# Patient Record
Sex: Female | Born: 1945 | Race: White | Hispanic: No | Marital: Married | Smoking: Former smoker
Health system: Southern US, Community
[De-identification: ages and names within clinical notes are randomized; demographics above are authoritative.]

## PROBLEM LIST (undated history)

## (undated) DIAGNOSIS — L920 Granuloma annulare: Secondary | ICD-10-CM

## (undated) DIAGNOSIS — G500 Trigeminal neuralgia: Secondary | ICD-10-CM

## (undated) DIAGNOSIS — R3915 Urgency of urination: Secondary | ICD-10-CM

## (undated) DIAGNOSIS — R519 Headache, unspecified: Secondary | ICD-10-CM

## (undated) DIAGNOSIS — Z8632 Personal history of gestational diabetes: Secondary | ICD-10-CM

## (undated) DIAGNOSIS — Q524 Other congenital malformations of vagina: Secondary | ICD-10-CM

## (undated) DIAGNOSIS — K219 Gastro-esophageal reflux disease without esophagitis: Secondary | ICD-10-CM

## (undated) DIAGNOSIS — I2510 Atherosclerotic heart disease of native coronary artery without angina pectoris: Secondary | ICD-10-CM

## (undated) DIAGNOSIS — O24419 Gestational diabetes mellitus in pregnancy, unspecified control: Secondary | ICD-10-CM

## (undated) DIAGNOSIS — Z8669 Personal history of other diseases of the nervous system and sense organs: Secondary | ICD-10-CM

## (undated) DIAGNOSIS — K068 Other specified disorders of gingiva and edentulous alveolar ridge: Secondary | ICD-10-CM

## (undated) DIAGNOSIS — E785 Hyperlipidemia, unspecified: Secondary | ICD-10-CM

## (undated) DIAGNOSIS — K6282 Dysplasia of anus: Secondary | ICD-10-CM

## (undated) DIAGNOSIS — J449 Chronic obstructive pulmonary disease, unspecified: Secondary | ICD-10-CM

## (undated) DIAGNOSIS — Z8489 Family history of other specified conditions: Secondary | ICD-10-CM

## (undated) DIAGNOSIS — R0600 Dyspnea, unspecified: Secondary | ICD-10-CM

## (undated) DIAGNOSIS — N809 Endometriosis, unspecified: Secondary | ICD-10-CM

## (undated) DIAGNOSIS — H469 Unspecified optic neuritis: Secondary | ICD-10-CM

## (undated) DIAGNOSIS — Z8719 Personal history of other diseases of the digestive system: Secondary | ICD-10-CM

## (undated) HISTORY — PX: MINOR HEMORRHOIDECTOMY: SHX6238

## (undated) HISTORY — DX: Granuloma annulare: L92.0

## (undated) HISTORY — DX: Atherosclerotic heart disease of native coronary artery without angina pectoris: I25.10

## (undated) HISTORY — PX: MANDIBLE SURGERY: SHX707

## (undated) HISTORY — DX: Gestational diabetes mellitus in pregnancy, unspecified control: O24.419

## (undated) HISTORY — PX: ABDOMINAL HYSTERECTOMY: SUR658

## (undated) HISTORY — DX: Other congenital malformations of vagina: Q52.4

## (undated) HISTORY — DX: Hyperlipidemia, unspecified: E78.5

## (undated) HISTORY — DX: Unspecified optic neuritis: H46.9

## (undated) HISTORY — DX: Endometriosis, unspecified: N80.9

## (undated) HISTORY — PX: TUBAL LIGATION: SHX77

---

## 2007-09-09 ENCOUNTER — Ambulatory Visit: Payer: Self-pay | Admitting: Obstetrics & Gynecology

## 2007-09-29 ENCOUNTER — Ambulatory Visit (HOSPITAL_COMMUNITY): Admission: RE | Admit: 2007-09-29 | Discharge: 2007-09-29 | Payer: Self-pay | Admitting: Obstetrics & Gynecology

## 2007-09-29 ENCOUNTER — Encounter: Admission: RE | Admit: 2007-09-29 | Discharge: 2007-09-29 | Payer: Self-pay | Admitting: Obstetrics & Gynecology

## 2007-10-04 ENCOUNTER — Ambulatory Visit: Payer: Self-pay | Admitting: Gynecology

## 2008-12-01 HISTORY — PX: CARDIAC CATHETERIZATION: SHX172

## 2009-08-01 DIAGNOSIS — I2510 Atherosclerotic heart disease of native coronary artery without angina pectoris: Secondary | ICD-10-CM

## 2009-08-01 HISTORY — DX: Atherosclerotic heart disease of native coronary artery without angina pectoris: I25.10

## 2009-08-24 ENCOUNTER — Ambulatory Visit: Payer: Medicare HMO | Admitting: Internal Medicine

## 2009-08-30 ENCOUNTER — Ambulatory Visit: Payer: Medicare HMO | Admitting: Cardiovascular Disease

## 2010-10-23 ENCOUNTER — Ambulatory Visit: Payer: Medicare HMO | Admitting: Internal Medicine

## 2010-12-01 DIAGNOSIS — Z87442 Personal history of urinary calculi: Secondary | ICD-10-CM

## 2010-12-01 HISTORY — DX: Personal history of urinary calculi: Z87.442

## 2011-04-15 NOTE — Assessment & Plan Note (Signed)
Wendy Patrick, YUILLE NO.:  [id number]   MEDICAL RECORD NO.:  [id number]          PATIENT TYPE:  POB   LOCATION:  [facility] at [facility]         FACILITY:  [facility]   PHYSICIAN:  Elsie Lincoln, MD      DATE OF BIRTH:  01-26-1946   DATE OF SERVICE:  09/09/2007                                  CLINIC NOTE   The patient is a 65 year old para 2 female menopausal for many years  since.  She had seen a gynecologist approximately 4 years ago. She  presents for annual exam and also for aid in menopausal symptoms. She is  placed on Estring several years ago which helped her vaginal dryness and  hot flashes as well as interestingly her severe diarrhea.  She stopped  it and all the symptoms had return including her diarrhea.  She would  like to go back on it and she understands the risk of breast cancer.  She said her mother did die of breast cancer.  However,  she is willing  to accept this risk of increased breast cancer.  She also understands  the risk of thromboembolism.  She has decreased libido, irritability,  lack of sleep, and severe hot flashes and night sweats.   PAST MEDICAL HISTORY:  She has lichen sclerosis, which she uses  betamethasone cream. She also has what looks like psoriasis on her neck.   PAST SURGICAL HISTORY:  1. Total abdominal hysterectomy.  2. Bilateral tubal ligation.  3. Her hysterectomy was done for she thinks endometriosis.  4. She also had a hemorrhoidectomy in the past.   MEDICATIONS:  Betamethasone cream 0.05% p.r.n. She is not using it  currently.   ALLERGIES:  None to latex or drugs.   FAMILY HISTORY:  Self had gestational diabetes.  Mother had breast  cancer and died at 60, 2 years after diagnosis and refusing  chemotherapy.   SOCIAL HISTORY:  She used to smoke, but quit several years ago. Does  drink caffeinated beverages and has an occasional alcoholic beverages.   SYSTEMIC REVIEW:  All 13 points positive for hot flashes, vaginal  itching, pain with intercourse.   PHYSICAL EXAM:  Pulse 64, blood pressure 139/80, weight 140.  GENERAL:  Well-nourished, well-developed no apparent distress.  HEENT:  Normocephalic, atraumatic.  NECK:  No masses.  There is lichen sclerosis on the left side.  LUNGS:  Clear to auscultation bilaterally.  HEART:  Regular rate and rhythm.  BREASTS:  No masses, no lymphadenopathy.  ABDOMEN:  Soft, nontender.  Well-healed vertical scar.  No organomegaly.  No hernia.  GENITALIA:  Tanner 5. __________ lichen sclerosis on the perineum.  Vagina is atrophic.  Bladder and urethra are well suspended.  Vaginal  vault is well suspended. There is pain on bimanual midline to slightly  left where there is a mass that is likely this is the ovary, but unable  to tell exactly. This ovary was much larger than what I was expecting on  postmenopausal woman, so I will get an ultrasound. Right adnexa is  nontender.  RECTOVAGINAL:  No masses, nontender.  EXTREMITIES:  Nontender.  No edema.   ASSESSMENT/PLAN:  65 year old  female  1. Mammogram.  2. No Pap smear needed as she has had a hysterectomy.  No history of      abnormal Pap smears.  3. Transvaginal ultrasound to evaluate pelvic mass.  4. Continue betamethasone cream p.r.n.  5. Estring for hormone replacement therapy.  6. Return to clinic when she returns from her vacation.  7. The patient needs a cholesterol and diabetes screening panel as      well as a TSH on her return.           ______________________________  Elsie Lincoln, MD     KL/MEDQ  D:  09/09/2007  T:  09/09/2007  Job:  [id number]

## 2011-04-15 NOTE — Assessment & Plan Note (Signed)
NAMECORRETTA, MUNCE NO.:  [id number]   MEDICAL RECORD NO.:  [id number]          PATIENT TYPE:  POB   LOCATION:  [facility] at [facility]         FACILITY:  [facility]   PHYSICIAN:  Elsie Lincoln, MD      DATE OF BIRTH:  November 14, 1946   DATE OF SERVICE:  10/04/2007                                  CLINIC NOTE   The patient is a 65 year old female who presents for results.  I had  felt some left fullness on her bimanual exam and she had some pain.  I  did a transvaginal ultrasound which revealed no visible ovaries.  It is  questionable whether maybe I was feeling stool in her colon or possibly  some scar tissue.  A CT would provide better determination of her  ovaries.  However, the patient does not want to pursue this.  She feels  there is nothing we could see on ultrasound that we could stop here.  I  agree with this.  She did have a mammogram and we reviewed that result,  and that was normal.  She is due for another mammogram in a year.  She  is not in need of any Pap smears.  She does need a cholesterol panel,  TSH, and glucose screen.  However, she will come back in when she is  fasting.  She understands the importance of this.  She also needs a  primary care physician and I recommend Dr. Milinda Antis or Dr. Ermalene Searing at  [facility].  The patient still needs to continue to have  calcium supplementation 1500 mg a day with vitamin D for bone protection  the patient is to come back in a year for a yearly exam but sooner for  her lab work.           ______________________________  Elsie Lincoln, MD     KL/MEDQ  D:  10/04/2007  T:  10/05/2007  Job:  [id number]

## 2012-06-17 ENCOUNTER — Ambulatory Visit: Payer: Self-pay | Admitting: Internal Medicine

## 2013-08-01 ENCOUNTER — Encounter (HOSPITAL_COMMUNITY): Payer: Self-pay | Admitting: Emergency Medicine

## 2013-08-01 ENCOUNTER — Emergency Department (INDEPENDENT_AMBULATORY_CARE_PROVIDER_SITE_OTHER)
Admission: EM | Admit: 2013-08-01 | Discharge: 2013-08-01 | Disposition: A | Discharge status: Home / Self Care | Payer: Medicare Other | Source: Home / Self Care | Attending: Emergency Medicine | Admitting: Emergency Medicine

## 2013-08-01 ENCOUNTER — Emergency Department (INDEPENDENT_AMBULATORY_CARE_PROVIDER_SITE_OTHER): Payer: Medicare Other

## 2013-08-01 DIAGNOSIS — J441 Chronic obstructive pulmonary disease with (acute) exacerbation: Secondary | ICD-10-CM

## 2013-08-01 HISTORY — DX: Chronic obstructive pulmonary disease, unspecified: J44.9

## 2013-08-01 MED ORDER — ALBUTEROL SULFATE HFA 108 (90 BASE) MCG/ACT IN AERS: 2.0000 | INHALATION_SPRAY | Freq: Four times a day (QID) | RESPIRATORY_TRACT | Status: DC

## 2013-08-01 MED ORDER — GUAIFENESIN-CODEINE 100-10 MG/5ML PO SYRP: 10.0000 mL | ORAL_SOLUTION | Freq: Four times a day (QID) | ORAL | Status: DC | PRN

## 2013-08-01 MED ORDER — TRAMADOL HCL 50 MG PO TABS: 100.0000 mg | ORAL_TABLET | Freq: Three times a day (TID) | ORAL | Status: DC | PRN

## 2013-08-01 MED ORDER — PREDNISONE 20 MG PO TABS: 20.0000 mg | ORAL_TABLET | Freq: Two times a day (BID) | ORAL | Status: DC

## 2013-08-01 MED ORDER — DOXYCYCLINE HYCLATE 100 MG PO TABS: 100.0000 mg | ORAL_TABLET | Freq: Two times a day (BID) | ORAL | Status: DC

## 2013-08-01 NOTE — ED Notes (Signed)
Waiting discharge papers 

## 2013-08-01 NOTE — ED Notes (Signed)
C/o cough x 10 days. Chest and rib pain from cough. Recent eye surgery states try to hold back cough. Not sleeping well.  Used robtusin every four hours with no relief. Hx of copd.

## 2013-08-01 NOTE — ED Provider Notes (Signed)
Chief Complaint:   Chief Complaint  Patient presents with  . Cough    x 10 days.     History of Present Illness:   Wendy Patrick is a 67 year old female who has had a ten-day history of a cough productive of small amounts of yellow sputum and some tightness in her chest. Her ribs have been aching on the left when she coughs. She's had slight rhinorrhea and sore throat. She had a chest x-ray and chest CT a month ago which showed COPD. She's not on any medication for this yet. She quit smoking 9 years ago. Prior to onset of her symptoms she had first a root canal, then surgery for detached retina. She denies fever, chills, headache, hemoptysis, or GI symptoms.  Review of Systems:  Other than noted above, the patient denies any of the following symptoms: Systemic:  No fevers, chills, sweats, weight loss or gain, fatigue, or tiredness. Eye:  No redness or discharge. ENT:  No ear pain, drainage, headache, nasal congestion, drainage, sinus pressure, difficulty swallowing, or sore throat. Neck:  No neck pain or swollen glands. Lungs:  No cough, sputum production, hemoptysis, wheezing, chest tightness, shortness of breath or chest pain. GI:  No abdominal pain, nausea, vomiting or diarrhea.  PMFSH:  Past medical history, family history, social history, meds, and allergies were reviewed. She takes Crestor and aspirin. She has a history of coronary artery disease and hypercholesterolemia.  Physical Exam:   Vital signs:  BP 166/89  Pulse 86  Temp(Src) 98.6 F (37 C) (Oral)  Resp 18  SpO2 98% General:  Alert and oriented.  In no distress.  Skin warm and dry. Eye:  No conjunctival injection or drainage. Lids were normal. ENT:  TMs and canals were normal, without erythema or inflammation.  Nasal mucosa was clear and uncongested, without drainage.  Mucous membranes were moist.  Pharynx was clear with no exudate or drainage.  There were no oral ulcerations or lesions. Neck:  Supple, no adenopathy,  tenderness or mass. Lungs:  No respiratory distress.  Lungs were clear to auscultation, without wheezes, rales or rhonchi.  Breath sounds were clear and equal bilaterally.  Heart:  Regular rhythm, without gallops, murmers or rubs. Skin:  Clear, warm, and dry, without rash or lesions.  Radiology:  Dg Chest 2 View  08/01/2013   *RADIOLOGY REPORT*  Clinical Data: Cough  CHEST - 2 VIEW  Comparison: None.  Findings: The heart and pulmonary vascularity are within normal limits.  The lungs are hyperinflated without evidence of focal infiltrate.  No acute bony abnormality is noted.  IMPRESSION: COPD without acute abnormality.   Original Report Authenticated By: Alcide Clever, M.D.   Assessment:  The encounter diagnosis was COPD with acute exacerbation.  No evidence of pneumonia.  Plan:   1.  The following meds were prescribed:   Discharge Medication List as of 08/01/2013  9:21 AM    START taking these medications   Details  albuterol (PROVENTIL HFA;VENTOLIN HFA) 108 (90 BASE) MCG/ACT inhaler Inhale 2 puffs into the lungs 4 (four) times daily., Starting 08/01/2013, Until Discontinued, Normal    doxycycline (VIBRA-TABS) 100 MG tablet Take 1 tablet (100 mg total) by mouth 2 (two) times daily., Starting 08/01/2013, Until Discontinued, Normal    guaiFENesin-codeine (GUIATUSS AC) 100-10 MG/5ML syrup Take 10 mLs by mouth 4 (four) times daily as needed for cough., Starting 08/01/2013, Until Discontinued, Print    predniSONE (DELTASONE) 20 MG tablet Take 1 tablet (20 mg total) by mouth  2 (two) times daily., Starting 08/01/2013, Until Discontinued, Normal    traMADol (ULTRAM) 50 MG tablet Take 2 tablets (100 mg total) by mouth every 8 (eight) hours as needed for pain., Starting 08/01/2013, Until Discontinued, Normal       2.  The patient was instructed in symptomatic care and handouts were given. 3.  The patient was told to return if becoming worse in any way, if no better in 3 or 4 days, and given some red flag  symptoms such as fever or any difficulty breathing that would indicate earlier return. 4.  Follow up here or with her primary care physician as needed.      Reuben Likes, MD 08/01/13 [phone]

## 2013-08-31 ENCOUNTER — Ambulatory Visit: Payer: Self-pay | Admitting: Internal Medicine

## 2013-09-07 ENCOUNTER — Ambulatory Visit: Payer: Self-pay | Admitting: Internal Medicine

## 2014-07-19 ENCOUNTER — Ambulatory Visit: Payer: Self-pay | Admitting: Obstetrics and Gynecology

## 2014-10-09 ENCOUNTER — Encounter (HOSPITAL_COMMUNITY): Payer: Self-pay | Admitting: Emergency Medicine

## 2014-10-09 ENCOUNTER — Inpatient Hospital Stay (HOSPITAL_COMMUNITY)
Admission: EM | Admit: 2014-10-09 | Discharge: 2014-10-13 | DRG: 123 | Disposition: A | Discharge status: Home / Self Care | Payer: Medicare HMO | Attending: Oncology | Admitting: Oncology

## 2014-10-09 DIAGNOSIS — H469 Unspecified optic neuritis: Secondary | ICD-10-CM | POA: Diagnosis present

## 2014-10-09 DIAGNOSIS — Z7982 Long term (current) use of aspirin: Secondary | ICD-10-CM

## 2014-10-09 DIAGNOSIS — R69 Illness, unspecified: Secondary | ICD-10-CM

## 2014-10-09 DIAGNOSIS — J449 Chronic obstructive pulmonary disease, unspecified: Secondary | ICD-10-CM | POA: Diagnosis present

## 2014-10-09 DIAGNOSIS — T380X5A Adverse effect of glucocorticoids and synthetic analogues, initial encounter: Secondary | ICD-10-CM | POA: Diagnosis present

## 2014-10-09 DIAGNOSIS — I2510 Atherosclerotic heart disease of native coronary artery without angina pectoris: Secondary | ICD-10-CM | POA: Diagnosis present

## 2014-10-09 DIAGNOSIS — R739 Hyperglycemia, unspecified: Secondary | ICD-10-CM | POA: Diagnosis present

## 2014-10-09 DIAGNOSIS — H5462 Unqualified visual loss, left eye, normal vision right eye: Secondary | ICD-10-CM

## 2014-10-09 DIAGNOSIS — Z79899 Other long term (current) drug therapy: Secondary | ICD-10-CM | POA: Diagnosis not present

## 2014-10-09 LAB — CBC
HCT: 45.1 % (ref 36.0–46.0)
HEMOGLOBIN: 14.7 g/dL (ref 12.0–15.0)
MCH: 27.7 pg (ref 26.0–34.0)
MCHC: 32.6 g/dL (ref 30.0–36.0)
MCV: 84.9 fL (ref 78.0–100.0)
PLATELETS: 320 10*3/uL (ref 150–400)
RBC: 5.31 MIL/uL — AB (ref 3.87–5.11)
RDW: 14.7 % (ref 11.5–15.5)
WBC: 5.8 10*3/uL (ref 4.0–10.5)

## 2014-10-09 LAB — SEDIMENTATION RATE: SED RATE: 2 mm/h (ref 0–22)

## 2014-10-09 LAB — BASIC METABOLIC PANEL
ANION GAP: 13 (ref 5–15)
BUN: 17 mg/dL (ref 6–23)
CALCIUM: 9.2 mg/dL (ref 8.4–10.5)
CO2: 26 meq/L (ref 19–32)
Chloride: 103 mEq/L (ref 96–112)
Creatinine, Ser: 0.86 mg/dL (ref 0.50–1.10)
GFR, EST AFRICAN AMERICAN: 78 mL/min — AB (ref >90)
GFR, EST NON AFRICAN AMERICAN: 67 mL/min — AB (ref >90)
GLUCOSE: 96 mg/dL (ref 70–99)
Potassium: 4.1 mEq/L (ref 3.7–5.3)
SODIUM: 142 meq/L (ref 137–147)

## 2014-10-09 MED ORDER — SODIUM CHLORIDE 0.9 % IV SOLN
250.0000 mg | Freq: Four times a day (QID) | INTRAVENOUS | Status: AC
  Administered 2014-10-10 – 2014-10-12 (×12): 250 mg via INTRAVENOUS
  Filled 2014-10-09 (×13): qty 2

## 2014-10-09 MED ORDER — METHYLPREDNISOLONE SODIUM SUCC 1000 MG IJ SOLR
250.0000 mg | Freq: Once | INTRAMUSCULAR | Status: AC
  Administered 2014-10-09: 250 mg via INTRAVENOUS
  Filled 2014-10-09 (×2): qty 2

## 2014-10-09 MED ORDER — PANTOPRAZOLE SODIUM 40 MG PO TBEC
40.0000 mg | DELAYED_RELEASE_TABLET | Freq: Every day | ORAL | Status: DC
  Administered 2014-10-09 – 2014-10-13 (×5): 40 mg via ORAL
  Filled 2014-10-09 (×5): qty 1

## 2014-10-09 MED ORDER — ACETAMINOPHEN 325 MG PO TABS: 650.0000 mg | ORAL_TABLET | Freq: Four times a day (QID) | ORAL | Status: DC | PRN

## 2014-10-09 MED ORDER — SODIUM CHLORIDE 0.9 % IV SOLN
INTRAVENOUS | Status: AC
  Administered 2014-10-09: 19:00:00 via INTRAVENOUS

## 2014-10-09 MED ORDER — HEPARIN SODIUM (PORCINE) 5000 UNIT/ML IJ SOLN
5000.0000 [IU] | Freq: Three times a day (TID) | INTRAMUSCULAR | Status: DC
  Administered 2014-10-09 – 2014-10-13 (×10): 5000 [IU] via SUBCUTANEOUS
  Filled 2014-10-09 (×10): qty 1

## 2014-10-09 MED ORDER — ACETAMINOPHEN 650 MG RE SUPP: 650.0000 mg | Freq: Four times a day (QID) | RECTAL | Status: DC | PRN

## 2014-10-09 NOTE — Progress Notes (Signed)
Patient just arrived to the floor, alert and oriented. Patient oriented to room and equipment. Initial assessment performed. NAD noted.

## 2014-10-09 NOTE — ED Notes (Signed)
Pt returned from CT °

## 2014-10-09 NOTE — ED Notes (Signed)
Admit Doctor completed with their assessment able to transport patient to floor.

## 2014-10-09 NOTE — Consult Note (Signed)
Reason for Consult:Optic neuritis Referring Physician: Beryle Beams  CC: Loss of vision  HPI: Wendy Patrick is an 68 y.o. female who reports that on Thursday night she was watching telelvison and had acute onset loss of vision from the left eye.  The patient reports that about 8 days prior she had some dental work. Afterward she developed a frontal headache that she felt was related to sinuses.  The headache then settled behind her left eye a few days prior to the loss of her vision.  She saw her ophthalmologist the next day and an MRI of the brain was ordered.  Over the weekend the patient reports that she felt off balance as well.  After her ophthalmologist could not get her an outpateint appointment the patient presented for evaluation in the ED.    Past Medical History  Diagnosis Date  . COPD (chronic obstructive pulmonary disease)     Past Surgical History  Procedure Laterality Date  . Eye surgery      Family history: Mother with breast cancer.  Died at 83.    Social History:  reports that she has never smoked. She does not have any smokeless tobacco history on file. She reports that she drinks alcohol. She reports that she does not use illicit drugs.  Allergies  Allergen Reactions  . Crestor [Rosuvastatin] Other (See Comments)    Leg cramps  . Latex Rash    Medications: I have reviewed the patient's current medications. Prior to Admission:  Current outpatient prescriptions: aspirin EC 81 MG tablet, Take 81 mg by mouth daily., Disp: , Rfl: ;  ibuprofen (ADVIL,MOTRIN) 200 MG tablet, Take 200 mg by mouth every 6 (six) hours as needed for moderate pain., Disp: , Rfl: ;  albuterol (PROVENTIL HFA;VENTOLIN HFA) 108 (90 BASE) MCG/ACT inhaler, Inhale 2 puffs into the lungs 4 (four) times daily., Disp: 1 Inhaler, Rfl: 0;  aspirin 81 MG tablet, Take 81 mg by mouth daily., Disp: , Rfl:  doxycycline (VIBRA-TABS) 100 MG tablet, Take 1 tablet (100 mg total) by mouth 2 (two) times daily., Disp: 20  tablet, Rfl: 0;  guaiFENesin-codeine (GUIATUSS AC) 100-10 MG/5ML syrup, Take 10 mLs by mouth 4 (four) times daily as needed for cough., Disp: 120 mL, Rfl: 0;  predniSONE (DELTASONE) 20 MG tablet, Take 1 tablet (20 mg total) by mouth 2 (two) times daily., Disp: 10 tablet, Rfl: 0 rosuvastatin (CRESTOR) 20 MG tablet, Take 20 mg by mouth daily., Disp: , Rfl: ;  traMADol (ULTRAM) 50 MG tablet, Take 2 tablets (100 mg total) by mouth every 8 (eight) hours as needed for pain., Disp: 30 tablet, Rfl: 0  ROS: History obtained from the patient  General ROS: negative for - chills, fatigue, fever, night sweats, weight gain or weight loss Psychological ROS: negative for - behavioral disorder, hallucinations, memory difficulties, mood swings or suicidal ideation Ophthalmic ROS: negative for - blurry vision, double vision, eye pain or loss of vision ENT ROS: as noted in HPI Allergy and Immunology ROS: negative for - hives or itchy/watery eyes Hematological and Lymphatic ROS: negative for - bleeding problems, bruising or swollen lymph nodes Endocrine ROS: negative for - galactorrhea, hair pattern changes, polydipsia/polyuria or temperature intolerance Respiratory ROS: negative for - cough, hemoptysis, shortness of breath or wheezing Cardiovascular ROS: negative for - chest pain, dyspnea on exertion, edema or irregular heartbeat Gastrointestinal ROS: negative for - abdominal pain, diarrhea, hematemesis, nausea/vomiting or stool incontinence Genito-Urinary ROS: negative for - dysuria, hematuria, incontinence or urinary frequency/urgency Musculoskeletal  ROS: negative for - joint swelling or muscular weakness Neurological ROS: as noted in HPI Dermatological ROS: negative for rash and skin lesion changes  Physical Examination: Blood pressure 153/68, pulse 67, temperature 97.5 F (36.4 C), temperature source Oral, resp. rate 16, SpO2 99 %.  Neurologic Examination Mental Status: Alert, oriented, thought content  appropriate.  Speech fluent without evidence of aphasia.  Able to follow 3 step commands without difficulty. Cranial Nerves: II: Discs flat bilaterally; Visual fields grossly normal in the right eye.  Vision minimal in the left eye.  Some color vision spared, pupils equal, round, reactive to light and accommodation III,IV, VI: ptosis not present, extra-ocular motions intact bilaterally V,VII: smile symmetric, facial light touch sensation normal bilaterally VIII: hearing normal bilaterally IX,X: gag reflex present XI: bilateral shoulder shrug XII: midline tongue extension Motor: Right : Upper extremity   5/5    Left:     Upper extremity   5/5  Lower extremity   5/5     Lower extremity   5/5 Tone and bulk:normal tone throughout; no atrophy noted Sensory: Pinprick and light touch intact throughout, bilaterally Deep Tendon Reflexes: 2+ and symmetric throughout Plantars: Right: mute   Left: mute Cerebellar: normal finger-to-nose and normal heel-to-shin test Gait: normal gait and station CV: pulses palpable throughout     Laboratory Studies:   Basic Metabolic Panel:  Recent Labs Lab 10/09/14 1717  NA 142  K 4.1  CL 103  CO2 26  GLUCOSE 96  BUN 17  CREATININE 0.86  CALCIUM 9.2    Liver Function Tests: No results for input(s): AST, ALT, ALKPHOS, BILITOT, PROT, ALBUMIN in the last 168 hours. No results for input(s): LIPASE, AMYLASE in the last 168 hours. No results for input(s): AMMONIA in the last 168 hours.  CBC:  Recent Labs Lab 10/09/14 1717  WBC 5.8  HGB 14.7  HCT 45.1  MCV 84.9  PLT 320    Cardiac Enzymes: No results for input(s): CKTOTAL, CKMB, CKMBINDEX, TROPONINI in the last 168 hours.  BNP: Invalid input(s): POCBNP  CBG: No results for input(s): GLUCAP in the last 168 hours.  Microbiology: No results found for this or any previous visit.  Coagulation Studies: No results for input(s): LABPROT, INR in the last 72 hours.  Urinalysis: No results  for input(s): COLORURINE, LABSPEC, PHURINE, GLUCOSEU, HGBUR, BILIRUBINUR, KETONESUR, PROTEINUR, UROBILINOGEN, NITRITE, LEUKOCYTESUR in the last 168 hours.  Invalid input(s): APPERANCEUR  Lipid Panel:  No results found for: CHOL, TRIG, HDL, CHOLHDL, VLDL, LDLCALC  HgbA1C: No results found for: HGBA1C  Urine Drug Screen:  No results found for: LABOPIA, COCAINSCRNUR, LABBENZ, AMPHETMU, THCU, LABBARB  Alcohol Level: No results for input(s): ETH in the last 168 hours.   Imaging: No results found.   Assessment/Plan: 68 year old female presenting with headache and loss of vision in the left eye.  The patient's vision loss has been present since 11/5.  There has been some mild improvement.  Remaining neurological examination is unremarkable.  MRI of the brain was reviewed and shows optic nerve hyperintensity.    Recommendations: 1.  Echocardiogram 2.  Carotid dopplers 3.  ESR, ANA, lyme titer, RPR 4.  Solumedrol 265m Q6 hours for 12 doses with GI prophylaxis 5.  Ultram prn for headache   LAlexis Goodell MD [facility] [phone]/08/2014, 7:48 PM

## 2014-10-09 NOTE — ED Notes (Signed)
Admit Doctor at bedside.  

## 2014-10-09 NOTE — ED Notes (Signed)
Pt may have [place] sandwich per doctor.

## 2014-10-09 NOTE — ED Notes (Signed)
Pt. unable to read anything on the visual acuity card with her left eye.

## 2014-10-09 NOTE — ED Notes (Signed)
Had MRI on left eye on friday because she  Lost vision in that eye on Thursday. Was called on Friday and told to  See neurologist today   For unspecified  Optic neuritis but she could not get in so she came to er

## 2014-10-09 NOTE — ED Provider Notes (Signed)
Medical screening examination/treatment/procedure(s) were conducted as a shared visit with non-physician practitioner(s) and myself.  I personally evaluated the patient during the encounter.   EKG Interpretation None      Patient seen by me. Patient evaluated by neurology. Patient had an outpatient MRI done. Patient's symptoms are consistent with optic neuritis including interpretation of the MRI. Patient with loss of vision in the left eye on Thursday. MRI was done on Friday. Patient's symptoms unchanged. Consulted neurology here and they will consult and they recommend admission.  Results for orders placed or performed during the hospital encounter of 10/09/14  CBC  Result Value Ref Range   WBC 5.8 4.0 - 10.5 K/uL   RBC 5.31 (H) 3.87 - 5.11 MIL/uL   Hemoglobin 14.7 12.0 - 15.0 g/dL   HCT 45.1 36.0 - 46.0 %   MCV 84.9 78.0 - 100.0 fL   MCH 27.7 26.0 - 34.0 pg   MCHC 32.6 30.0 - 36.0 g/dL   RDW 14.7 11.5 - 15.5 %   Platelets 320 150 - 400 K/uL  Basic metabolic panel  Result Value Ref Range   Sodium 142 137 - 147 mEq/L   Potassium 4.1 3.7 - 5.3 mEq/L   Chloride 103 96 - 112 mEq/L   CO2 26 19 - 32 mEq/L   Glucose, Bld 96 70 - 99 mg/dL   BUN 17 6 - 23 mg/dL   Creatinine, Ser 0.86 0.50 - 1.10 mg/dL   Calcium 9.2 8.4 - 10.5 mg/dL   GFR calc non Af Amer 67 (L) >90 mL/min   GFR calc Af Amer 78 (L) >90 mL/min   Anion gap 13 5 - 15     Fredia Sorrow, MD 10/09/14 1826

## 2014-10-09 NOTE — ED Provider Notes (Signed)
CSN: [id number]     Arrival date & time 10/09/14  1223 History   First MD Initiated Contact with Patient 10/09/14 1544     Chief Complaint  Patient presents with  . Eye Pain     (Consider location/radiation/quality/duration/timing/severity/associated sxs/prior Treatment) The history is provided by the patient and medical records. No language interpreter was used.     Wendy Patrick is a 68 y.o. female  with a hx of COPD presents to the Emergency Department complaining of loss of vision in the left eye on Thursday (4 days ago). She had an MRI of the eye on Friday.  Pt reports the MRI showed and was told to see the neurologist today, but was unable to be seen therefore she presented to the ED.   Associated symptoms include pain with extraocular movements and mild headache.  No aggravating or alleviating factors. Patient denies fever, chills, neck pain, neck stiffness, chest pain, shortness of breath, abdominal pain, nausea, vomiting, diarrhea, weakness, dizziness, syncope.    Past Medical History  Diagnosis Date  . COPD (chronic obstructive pulmonary disease)    Past Surgical History  Procedure Laterality Date  . Eye surgery     No family history on file. History  Substance Use Topics  . Smoking status: Never Smoker   . Smokeless tobacco: Not on file  . Alcohol Use: Yes   OB History    No data available     Review of Systems  Constitutional: Negative for fever, diaphoresis, appetite change, fatigue and unexpected weight change.  HENT: Negative for mouth sores.   Eyes: Positive for pain and visual disturbance.  Respiratory: Negative for cough, chest tightness, shortness of breath and wheezing.   Cardiovascular: Negative for chest pain.  Gastrointestinal: Negative for nausea, vomiting, abdominal pain, diarrhea and constipation.  Endocrine: Negative for polydipsia, polyphagia and polyuria.  Genitourinary: Negative for dysuria, urgency, frequency and hematuria.  Musculoskeletal:  Negative for back pain and neck stiffness.  Skin: Negative for rash.  Allergic/Immunologic: Negative for immunocompromised state.  Neurological: Positive for headaches. Negative for syncope and light-headedness.  Hematological: Does not bruise/bleed easily.  Psychiatric/Behavioral: Negative for sleep disturbance. The patient is not nervous/anxious.       Allergies  Crestor and Latex  Home Medications   Prior to Admission medications   Medication Sig Start Date End Date Taking? Authorizing Provider  aspirin EC 81 MG tablet Take 81 mg by mouth daily.   Yes Historical Provider, MD  ibuprofen (ADVIL,MOTRIN) 200 MG tablet Take 200 mg by mouth every 6 (six) hours as needed for moderate pain.   Yes Historical Provider, MD  albuterol (PROVENTIL HFA;VENTOLIN HFA) 108 (90 BASE) MCG/ACT inhaler Inhale 2 puffs into the lungs 4 (four) times daily. 08/01/13   Harden Mo, MD  aspirin 81 MG tablet Take 81 mg by mouth daily.    Historical Provider, MD  doxycycline (VIBRA-TABS) 100 MG tablet Take 1 tablet (100 mg total) by mouth 2 (two) times daily. 08/01/13   Harden Mo, MD  guaiFENesin-codeine [facility]) 100-10 MG/5ML syrup Take 10 mLs by mouth 4 (four) times daily as needed for cough. 08/01/13   Harden Mo, MD  predniSONE (DELTASONE) 20 MG tablet Take 1 tablet (20 mg total) by mouth 2 (two) times daily. 08/01/13   Harden Mo, MD  rosuvastatin (CRESTOR) 20 MG tablet Take 20 mg by mouth daily.    Historical Provider, MD  traMADol (ULTRAM) 50 MG tablet Take 2 tablets (100 mg  total) by mouth every 8 (eight) hours as needed for pain. 08/01/13   Harden Mo, MD   BP 153/68 mmHg  Pulse 67  Temp(Src) 97.5 F (36.4 C) (Oral)  Resp 16  SpO2 99% Physical Exam  Constitutional: She is oriented to person, place, and time. She appears well-developed and well-nourished. No distress.  HENT:  Head: Normocephalic and atraumatic.  Mouth/Throat: Oropharynx is clear and moist.  Eyes: Conjunctivae and  EOM are normal. Pupils are equal, round, and reactive to light. No scleral icterus.  No horizontal, vertical or rotational nystagmus  Neck: Normal range of motion. Neck supple.  Full active and passive ROM without pain No midline or paraspinal tenderness No nuchal rigidity or meningeal signs  Cardiovascular: Normal rate, regular rhythm, normal heart sounds and intact distal pulses.   No murmur heard. Pulmonary/Chest: Effort normal and breath sounds normal. No respiratory distress. She has no wheezes. She has no rales.  Abdominal: Soft. Bowel sounds are normal. There is no tenderness. There is no rebound and no guarding.  Musculoskeletal: Normal range of motion.  Lymphadenopathy:    She has no cervical adenopathy.  Neurological: She is alert and oriented to person, place, and time. She has normal reflexes. No cranial nerve deficit. She exhibits normal muscle tone. Coordination normal.  Mental Status:  Alert, oriented, thought content appropriate. Speech fluent without evidence of aphasia. Able to follow 2 step commands without difficulty.  Cranial Nerves:  II:  Peripheral visual fields grossly normal in the right eye; minimal-no vision in the left eye, pupils equal, round, reactive to light III,IV, VI: ptosis not present, extra-ocular motions intact bilaterally (with pain in the left) V,VII: smile symmetric, facial light touch sensation equal VIII: hearing grossly normal bilaterally  IX,X: gag reflex present  XI: bilateral shoulder shrug equal and strong XII: midline tongue extension  Motor:  5/5 in upper and lower extremities bilaterally including strong and equal grip strength and dorsiflexion/plantar flexion Sensory: Pinprick and light touch normal in all extremities.  Deep Tendon Reflexes: 2+ and symmetric  Cerebellar: normal finger-to-nose with bilateral upper extremities Gait: normal gait and balance CV: distal pulses palpable throughout   Skin: Skin is warm and dry. No rash  noted. She is not diaphoretic.  Psychiatric: She has a normal mood and affect. Her behavior is normal. Judgment and thought content normal.  Nursing note and vitals reviewed.   ED Course  Procedures (including critical care time) Labs Review Labs Reviewed  CBC  BASIC METABOLIC PANEL    Imaging Review No results found.   EKG Interpretation None      MDM   Final diagnoses:  Optic neuritis  Vision loss, left eye   Wendy Patrick presents with vision loss in the left eye for 5 days. Patient diagnosed with optic neuritis 3 days ago but was unable to see her neurologist therefore presented here to the emergency department. Patient denies history of CVA or focal neurologic deficits aside from the loss of vision.     5:05 PM Patient discussed with Dr. Katy Fitch who reports that MRI of the brain showed small white matter lesions that were questionably ischemic with a definite left optic neuritis.  Dr. Katy Fitch will fax the results of this MRI. Patient has a disc with images. Will consult neurology.  5:28 PM Pt discussed with Dr. Doy Mince of neurology who will consult.  Pt will receive methylprednisolone 250mg  IV and be admitted to medicine.    BP 153/68 mmHg  Pulse 67  Temp(Src) 97.5 F (36.4 C) (Oral)  Resp 16  SpO2 99%  Pt labs pending.  Pt discussed with Dr. Rogene Houston who will admit when labs result.    Jarrett Soho Koda Defrank, PA-C 10/09/14 [place], MD 10/09/14 1750

## 2014-10-09 NOTE — H&P (Signed)
Date: 10/10/2014               Patient Name:  Wendy Patrick MRN: [id number]  DOB: [birth year] Age / Sex: 68 y.o., female   PCP: Pcp Not In System         Medical Service: Internal Medicine Teaching Service         Attending Physician: Dr. Annia Belt, MD    First Contact: Dr. Randell Patient Pager: [phone]  Second Contact: Dr. Denton Brick Pager: [phone]       After Hours (After 5p/  First Contact Pager: [phone]  weekends / holidays): Second Contact Pager: [phone]   Chief Complaint: vision loss  History of Present Illness:   Patient is a 68 year old female with a history of COPD and coronary artery disease who presents with vision loss. Patient states that on October 29, she underwent a minor procedure at the dentist office, after which she noticed having increased frequency and severity of headaches. Patient states that she has been having headaches and congestion for several months now, and thinks that it may be sinus related.   About a week later, patient was watching Thursday night football when she found that she had sudden vision loss in her left eye. She states that the vision losses mostly in the center of her visual field, saying that she has somewhat preserved vision and color perception in her periphery. She states that she also has had associated photopsias. Patient also states that the vision loss was associated with throbbing temporal pain as well as intense left eye pain that was exacerbated by extraocular movements. The day after, patient was evaluated by her ophthalmologist (Dr. Katy Fitch) who suspected optic neuritis and sent her for an MRI that confirmed such findings.   Patient was to be evaluated by neurology, however, she was not able to get into clinic in a short time period, so Dr. Katy Fitch advised that she be evaluated and treated in the hospital. In the emergency department, patient received 250 mg Solu-Medrol and neurology was consulted.  Upon interview, patient reports  that her visual loss has been stable, although the pain in her eye and temporal area had completely resolved. Patient denied any jaw claudication or a significant history of morning stiffness or shoulder pain. Patient denies any personal or family history of multiple sclerosis. Patient only reports cataract surgery in her right eye and no eye surgeries in her left. Patient otherwise denies any fevers, chills, chest pain, dyspnea, abdominal pain, constipation, diarrhea, nausea, or vomiting.   Meds: Current Facility-Administered Medications  Medication Dose Route Frequency Provider Last Rate Last Dose  . 0.9 %  sodium chloride infusion   Intravenous STAT Fredia Sorrow, MD 10 mL/hr at 10/09/14 1912    . acetaminophen (TYLENOL) tablet 650 mg  650 mg Oral Q6H PRN Francesca Oman, DO       Or  . acetaminophen (TYLENOL) suppository 650 mg  650 mg Rectal Q6H PRN Francesca Oman, DO      . heparin injection 5,000 Units  5,000 Units Subcutaneous 3 times per day Francesca Oman, DO   5,000 Units at 10/09/14 2253  . methylPREDNISolone sodium succinate (SOLU-MEDROL) 250 mg in sodium chloride 0.9 % 50 mL IVPB  250 mg Intravenous Q6H Francesca Oman, DO   250 mg at 10/10/14 0010  . pantoprazole (PROTONIX) EC tablet 40 mg  40 mg Oral Daily Francesca Oman, DO   40 mg at 10/09/14 2253    Past  Medical History  Diagnosis Date  . COPD (chronic obstructive pulmonary disease)    Past Surgical History  Procedure Laterality Date  . Eye surgery      Allergies: Allergies as of 10/09/2014 - Review Complete 10/09/2014  Allergen Reaction Noted  . Crestor [rosuvastatin] Other (See Comments) 10/09/2014  . Latex Rash 10/09/2014   No family history on file. History   Social History  . Marital Status: Married    Spouse Name: N/A    Number of Children: N/A  . Years of Education: N/A   Occupational History  . Not on file.   Social History Main Topics  . Smoking status: Never Smoker   . Smokeless tobacco: Not on file   . Alcohol Use: Yes  . Drug Use: No  . Sexual Activity: Not Currently   Other Topics Concern  . Not on file   Social History Narrative    Review of Systems: All pertinent ROS has stated in HPI.   Physical Exam: Blood pressure 153/63, pulse 62, temperature 97.8 F (36.6 C), temperature source Oral, resp. rate 18, height 5' 4" (1.626 m), weight 124 lb 1.6 oz (56.291 kg), SpO2 98 %. General: resting in bed, in no acute distress HEENT: right pupil round and reactive to light, left pupil sluggish, EOMI, no scleral icterus Cardiac: RRR, no rubs, murmurs or gallops Pulm: clear to auscultation bilaterally, moving normal volumes of air Abd: soft, nontender, nondistended, BS present Ext: warm and well perfused, no pedal edema Neuro: alert and oriented X3  - CNII:     - OD: visual acuity grossly intact, peripheral vision intact, no abnormalities noted on funduscopic exam    - OS: worse than 20/200, peripheral vision intact on the left, central visual field deficit  afferent pupillary defect, no abnormalities noted on funduscopic exam, no evidence of papillitis  - cranial nerves III through XII intact  - motor: Strength 5+ throughout all 4 extremities  - Sensation: Intact to light touch throughout all 4 extremities  - Reflexes: 2+ deep tendon reflexes throughout all 4 extremities  - cerebellar: Finger to nose intact  Lab results: Basic Metabolic Panel:  Recent Labs  10/09/14 1717  NA 142  K 4.1  CL 103  CO2 26  GLUCOSE 96  BUN 17  CREATININE 0.86  CALCIUM 9.2   Liver Function Tests: No results for input(s): AST, ALT, ALKPHOS, BILITOT, PROT, ALBUMIN in the last 72 hours. No results for input(s): LIPASE, AMYLASE in the last 72 hours. No results for input(s): AMMONIA in the last 72 hours. CBC:  Recent Labs  10/09/14 1717  WBC 5.8  HGB 14.7  HCT 45.1  MCV 84.9  PLT 320   Cardiac Enzymes: No results for input(s): CKTOTAL, CKMB, CKMBINDEX, TROPONINI in the last 72  hours. BNP: No results for input(s): PROBNP in the last 72 hours. D-Dimer: No results for input(s): DDIMER in the last 72 hours. CBG: No results for input(s): GLUCAP in the last 72 hours. Hemoglobin A1C: No results for input(s): HGBA1C in the last 72 hours. Fasting Lipid Panel: No results for input(s): CHOL, HDL, LDLCALC, TRIG, CHOLHDL, LDLDIRECT in the last 72 hours. Thyroid Function Tests: No results for input(s): TSH, T4TOTAL, FREET4, T3FREE, THYROIDAB in the last 72 hours. Anemia Panel: No results for input(s): VITAMINB12, FOLATE, FERRITIN, TIBC, IRON, RETICCTPCT in the last 72 hours. Coagulation: No results for input(s): LABPROT, INR in the last 72 hours. Urine Drug Screen: Drugs of Abuse  No results found for: LABOPIA, COCAINSCRNUR,  LABBENZ, AMPHETMU, THCU, LABBARB  Alcohol Level: No results for input(s): ETH in the last 72 hours. Urinalysis: No results for input(s): COLORURINE, LABSPEC, PHURINE, GLUCOSEU, HGBUR, BILIRUBINUR, KETONESUR, PROTEINUR, UROBILINOGEN, NITRITE, LEUKOCYTESUR in the last 72 hours.  Invalid input(s): APPERANCEUR  Imaging results:   MR brain and MR orbits with and without contrast 10/06/2014     Findings: Basal cisterns, ventricles and cortical sulci are appropriate for the patient's age. There is no shift of midline structures. Axial FLAIR imaging demonstrates patchy and focal increased signal within the cerebral white matter tracts bilaterally, somewhat more pronounced on the left than the right consistent with senescent microvascular ischemic change. There is no intracranial hemorrhage or mass. No extra-axial collections are seen. Diffusion weighted imaging is negative for restricted diffusion and acute infarction.  There is definite optic nerve asymmetry with moderate swelling of the left optic nerve that measures approximately 6 mm in transverse diameter compared with approximately 4 mm in transverse diameter on the right at the same level.  Following contrast administration, the left optic nerve demonstrates fairly marked diffuse enhancement. Refer to series 20 postcontrast T1 coronal images 6 through 10.  Impression: Relatively mild cerebral white matter senescent microvascular ischemic change. No acute intracranial process evident. Findings of left optic neuritis as described above.  Assessment & Plan by Problem: Principal Problem:   Optic neuritis Active Problems:   COPD (chronic obstructive pulmonary disease)  Patient is a 68 year old female with a history of COPD and coronary artery disease who presents with vision loss most likely secondary to optic neuritis.  Optic neuritis: Patient with sudden loss of vision in the left eye since Thursday. Patient with an outside MRI that showing optic nerve hyperintensity consistent with optic neuritis, and neurology was consulted and recommended admission. Patient denies a history of multiple sclerosis. Features not typical of optic neuritis include age of presentation (usually 20-50 years) and sudden onset (optic neuritis usually manifests over hours to days). However, the central scotoma is typical, and MRI findings are strongly suggestive.  - Continue Solu-Medrol 250 mg every 6 hours for 12 doses   - Spoke with Dr. Katy Fitch over the phone who says that patient can follow-up as an outpatient in 2 weeks.  - Follow-up with neurology as an outpatient as 30% of individuals with developmental multiple sclerosis of 5 years.  - Appreciate neurology recommendations: Echocardiogram, carotid Dopplers, ESR, ANA, Lyme titer, RPR, tramadol when necessary for headache.  COPD: clear to auscultation bilaterally on exam. No acute issues.   - Continue home albuterol  Coronary artery disease: Patient reports some kind of procedure that showed 40% occlusion. Patient denies any history of stenting or cardiac surgery. Patient reports having myopathies in response to Crestor. She has not been taking this  [email or web address]  - Continue home aspirin 81 mg daily  - Hold home Crestor 20 mg daily  Diet: heart healthy diet Prophylaxis: heparin subcutaneous Code: full code  Dispo: Disposition is deferred at this time, awaiting improvement of current medical problems. Anticipated discharge in approximately 3 day(s).   The patient does not have a current PCP (Pcp Not In System) and does need an [facility] follow-up appointment after discharge.  The patient does not have transportation limitations that hinder transportation to clinic appointments.  Signed: Luan Moore, M.D., Ph.D. Internal Medicine Teaching Service, PGY-1 10/10/2014, 12:13 AM

## 2014-10-10 DIAGNOSIS — H539 Unspecified visual disturbance: Secondary | ICD-10-CM

## 2014-10-10 DIAGNOSIS — I2510 Atherosclerotic heart disease of native coronary artery without angina pectoris: Secondary | ICD-10-CM

## 2014-10-10 DIAGNOSIS — H5462 Unqualified visual loss, left eye, normal vision right eye: Secondary | ICD-10-CM

## 2014-10-10 DIAGNOSIS — H469 Unspecified optic neuritis: Secondary | ICD-10-CM

## 2014-10-10 DIAGNOSIS — H547 Unspecified visual loss: Secondary | ICD-10-CM

## 2014-10-10 DIAGNOSIS — J449 Chronic obstructive pulmonary disease, unspecified: Secondary | ICD-10-CM

## 2014-10-10 LAB — COMPREHENSIVE METABOLIC PANEL
ALT: 14 U/L (ref 0–35)
AST: 19 U/L (ref 0–37)
Albumin: 3.7 g/dL (ref 3.5–5.2)
Alkaline Phosphatase: 74 U/L (ref 39–117)
Anion gap: 15 (ref 5–15)
BUN: 17 mg/dL (ref 6–23)
CO2: 24 mEq/L (ref 19–32)
CREATININE: 0.74 mg/dL (ref 0.50–1.10)
Calcium: 9.3 mg/dL (ref 8.4–10.5)
Chloride: 103 mEq/L (ref 96–112)
GFR, EST NON AFRICAN AMERICAN: 84 mL/min — AB (ref >90)
GLUCOSE: 189 mg/dL — AB (ref 70–99)
Potassium: 4.2 mEq/L (ref 3.7–5.3)
Sodium: 142 mEq/L (ref 137–147)
Total Bilirubin: 0.3 mg/dL (ref 0.3–1.2)
Total Protein: 7 g/dL (ref 6.0–8.3)

## 2014-10-10 LAB — CBC
HCT: 49 % — ABNORMAL HIGH (ref 36.0–46.0)
Hemoglobin: 16.1 g/dL — ABNORMAL HIGH (ref 12.0–15.0)
MCH: 28.3 pg (ref 26.0–34.0)
MCHC: 32.9 g/dL (ref 30.0–36.0)
MCV: 86.3 fL (ref 78.0–100.0)
PLATELETS: 346 10*3/uL (ref 150–400)
RBC: 5.68 MIL/uL — AB (ref 3.87–5.11)
RDW: 14.4 % (ref 11.5–15.5)
WBC: 8.4 10*3/uL (ref 4.0–10.5)

## 2014-10-10 LAB — ANA: Anti Nuclear Antibody(ANA): NEGATIVE

## 2014-10-10 LAB — HIV ANTIBODY (ROUTINE TESTING W REFLEX): HIV: NONREACTIVE

## 2014-10-10 LAB — B. BURGDORFI ANTIBODIES: B burgdorferi Ab IgG+IgM: 0.22 {ISR}

## 2014-10-10 LAB — GLUCOSE, CAPILLARY
Glucose-Capillary: 169 mg/dL — ABNORMAL HIGH (ref 70–99)
Glucose-Capillary: 176 mg/dL — ABNORMAL HIGH (ref 70–99)

## 2014-10-10 LAB — RPR

## 2014-10-10 MED ORDER — ASPIRIN 81 MG PO CHEW
81.0000 mg | CHEWABLE_TABLET | Freq: Every day | ORAL | Status: DC
  Administered 2014-10-10 – 2014-10-13 (×4): 81 mg via ORAL
  Filled 2014-10-10 (×8): qty 1

## 2014-10-10 NOTE — Progress Notes (Signed)
OT Cancellation Note  Patient Details Name: Wendy Patrick MRN: [id number] DOB: 1946/06/22   Cancelled Treatment:    Reason Eval/Treat Not Completed: Patient at procedure or test/ unavailable (off unit currently)  Peri Maris  Pager: [phone]  10/10/2014, 9:00 AM

## 2014-10-10 NOTE — Progress Notes (Signed)
Subjective:  Wendy Patrick reports that her vision is minimally improving, she can make out the outlines of objects whereas previously she could not see anything from the left eye. She occasionally has a twinge of discomfort in the left eye with ocular movements however states that the previous pain is largely gone. She denies any other neurological symptoms.  Objective: Vital signs in last 24 hours: Filed Vitals:   10/09/14 2138 10/10/14 0157 10/10/14 0614 10/10/14 1009  BP: 153/63 132/76 133/76 130/65  Pulse: 62 73 71 80  Temp: 97.8 F (36.6 C) 97.7 F (36.5 C) 97.6 F (36.4 C) 97.7 F (36.5 C)  TempSrc: Oral Oral Oral Oral  Resp: 18 18 16 18   Height: 5\' 4"  (1.626 m)     Weight: 56.291 kg (124 lb 1.6 oz)     SpO2: 98% 96% 99% 97%   Weight change:   Intake/Output Summary (Last 24 hours) at 10/10/14 1020 Last data filed at 10/10/14 0900  Gross per 24 hour  Intake    120 ml  Output      0 ml  Net    120 ml   General appearance: alert, cooperative, appears stated age and no distress Head: Normocephalic, without obvious abnormality, atraumatic Eyes: conjunctivae/corneas clear. Pupils equal and reactive to light, improved. Some discomfort with EOMs Throat: lips, mucosa, and tongue normal; teeth and gums normal Back: symmetric, no curvature. ROM normal. No CVA tenderness. Lungs: clear to auscultation bilaterally Heart: regular rate and rhythm, S1, S2 normal, no murmur, click, rub or gallop Abdomen: soft, non-tender; bowel sounds normal; no masses,  no organomegaly Extremities: extremities normal, atraumatic, no cyanosis or edema Neurologic: Mental status: Alert, oriented, thought content appropriate CNII: Visual acuity and visual fields normal in R eye. L eye acuity is still diminished, although she can make out the outline of objects and shapes, some peripheral field deficit but is improving Motor: grossly normal Reflexes: 2+ and symmetric Coordination: normal Lab  Results: Results for orders placed or performed during the hospital encounter of 10/09/14 (from the past 24 hour(s))  CBC     Status: Abnormal   Collection Time: 10/09/14  5:17 PM  Result Value Ref Range   WBC 5.8 4.0 - 10.5 K/uL   RBC 5.31 (H) 3.87 - 5.11 MIL/uL   Hemoglobin 14.7 12.0 - 15.0 g/dL   HCT 45.1 36.0 - 46.0 %   MCV 84.9 78.0 - 100.0 fL   MCH 27.7 26.0 - 34.0 pg   MCHC 32.6 30.0 - 36.0 g/dL   RDW 14.7 11.5 - 15.5 %   Platelets 320 150 - 400 K/uL  Basic metabolic panel     Status: Abnormal   Collection Time: 10/09/14  5:17 PM  Result Value Ref Range   Sodium 142 137 - 147 mEq/L   Potassium 4.1 3.7 - 5.3 mEq/L   Chloride 103 96 - 112 mEq/L   CO2 26 19 - 32 mEq/L   Glucose, Bld 96 70 - 99 mg/dL   BUN 17 6 - 23 mg/dL   Creatinine, Ser 0.86 0.50 - 1.10 mg/dL   Calcium 9.2 8.4 - 10.5 mg/dL   GFR calc non Af Amer 67 (L) >90 mL/min   GFR calc Af Amer 78 (L) >90 mL/min   Anion gap 13 5 - 15  Sedimentation rate     Status: None   Collection Time: 10/09/14  7:48 PM  Result Value Ref Range   Sed Rate 2 0 - 22 mm/hr  ANA  Status: None   Collection Time: 10/09/14  7:48 PM  Result Value Ref Range   ANA Ser Ql NEGATIVE NEGATIVE  B. burgdorfi antibodies     Status: None   Collection Time: 10/09/14  7:48 PM  Result Value Ref Range   B burgdorferi Ab IgG+IgM 0.22 ISR  RPR     Status: None   Collection Time: 10/09/14  7:48 PM  Result Value Ref Range   RPR NON REAC NON REAC  HIV antibody     Status: None   Collection Time: 10/09/14  7:48 PM  Result Value Ref Range   HIV 1&2 Ab, 4th Generation NONREACTIVE NONREACTIVE  Comprehensive metabolic panel     Status: Abnormal   Collection Time: 10/10/14  8:34 AM  Result Value Ref Range   Sodium 142 137 - 147 mEq/L   Potassium 4.2 3.7 - 5.3 mEq/L   Chloride 103 96 - 112 mEq/L   CO2 24 19 - 32 mEq/L   Glucose, Bld 189 (H) 70 - 99 mg/dL   BUN 17 6 - 23 mg/dL   Creatinine, Ser 0.74 0.50 - 1.10 mg/dL   Calcium 9.3 8.4 - 10.5  mg/dL   Total Protein 7.0 6.0 - 8.3 g/dL   Albumin 3.7 3.5 - 5.2 g/dL   AST 19 0 - 37 U/L   ALT 14 0 - 35 U/L   Alkaline Phosphatase 74 39 - 117 U/L   Total Bilirubin 0.3 0.3 - 1.2 mg/dL   GFR calc non Af Amer 84 (L) >90 mL/min   GFR calc Af Amer >90 >90 mL/min   Anion gap 15 5 - 15  CBC     Status: Abnormal   Collection Time: 10/10/14  8:34 AM  Result Value Ref Range   WBC 8.4 4.0 - 10.5 K/uL   RBC 5.68 (H) 3.87 - 5.11 MIL/uL   Hemoglobin 16.1 (H) 12.0 - 15.0 g/dL   HCT 49.0 (H) 36.0 - 46.0 %   MCV 86.3 78.0 - 100.0 fL   MCH 28.3 26.0 - 34.0 pg   MCHC 32.9 30.0 - 36.0 g/dL   RDW 14.4 11.5 - 15.5 %   Platelets 346 150 - 400 K/uL   Micro Results: No results found for this or any previous visit (from the past 240 hour(s)). Studies/Results: No results found. Medications: I have reviewed the patient's current medications. Scheduled Meds: . aspirin  81 mg Oral Daily  . heparin  5,000 Units Subcutaneous 3 times per day  . methylPREDNISolone (SOLU-MEDROL) injection  250 mg Intravenous Q6H  . pantoprazole  40 mg Oral Daily   Continuous Infusions:  PRN Meds:.acetaminophen **OR** acetaminophen Assessment/Plan: Principal Problem:   Optic neuritis Active Problems:   COPD (chronic obstructive pulmonary disease)  Optic neuritis: Patient with sudden loss of vision in the left eye since Thursday. Patient with an outside MRI that showing optic nerve hyperintensity consistent with optic neuritis. Patient denies a history of multiple sclerosis. Echo revealed no evidence of cardiac source of thrombus, dopplers showed 1-39% carotid stenosis bilaterally. She was ANA negative, RPR non-reactive, and HIV non-reactive, lyme negative. - Continue Solu-Medrol 250 mg every 6 hours for 12 doses   - Transition to oral prednisone 60 mg afterwards  - May need to consider antibiotic ppx given high steroid load - Spoke with Dr. Katy Fitch over the phone who says that patient can follow-up as an outpatient in  2 weeks. - Follow-up with neurology as an outpatient as 30% of individuals with developmental  multiple sclerosis of 5 years.  COPD: clear to auscultation bilaterally on exam. No acute issues.  - Continue home albuterol  Coronary artery disease: Patient reports some kind of procedure that showed 40% occlusion. Patient denies any history of stenting or cardiac surgery. Patient reports having myopathies in response to Crestor. She has not been taking this lately. - Continue home aspirin 81 mg daily - Hold home Crestor 20 mg daily  Diet: heart healthy diet Prophylaxis: heparin subcutaneous Code: full code  Dispo: Disposition is deferred at this time, awaiting improvement of current medical problems. Anticipated discharge in approximately 3 day(s).   The patient does not have a current PCP (Pcp Not In System) and does need an [facility] hospital follow-up appointment after discharge.  The patient does not have transportation limitations that hinder transportation to clinic appointments.  This is a Careers information officer Note.  The care of the patient was discussed with Dr. Denton Brick and the assessment and plan formulated with their assistance.  Please see their attached note for official documentation of the daily encounter.   LOS: 1 day   Abran Duke, Med Student 10/10/2014, 10:20 AM

## 2014-10-10 NOTE — Evaluation (Signed)
Occupational Therapy Evaluation Patient Details Name: Wendy Patrick MRN: [id number] DOB: 1946/06/14 Today's Date: 10/10/2014    History of Present Illness Patient is a 68 y/o female with history of COPD and CAD who presents with vision loss. Per MD note, pt was watching Thursday night football when she found that she had sudden vision loss in her left eye. She states that the vision losses mostly in the center of her visual field, saying that she has somewhat preserved vision and color perception in her periphery. She states that she also has had associated photopsias. Patient also states that the vision loss was associated with throbbing temporal pain as well as intense left eye pain that was exacerbated by extraocular movements. The day after, patient was evaluated by her ophthalmologist (Dr. Katy Fitch) who suspected optic neuritis and sent her for an MRI that confirmed such findings.    Clinical Impression   PT admitted with new onset of L eye visual changes with CVA workup underway. Dr Katy Fitch dx optic neuritis. Pt currently with functional limitiations due to the deficits listed below (see OT problem list). Pt independent with adls. Pt will benefit from skilled OT to increase their independence and safety with adls and balance to allow discharge outpatient for vision and balance. Ot to follow acutely for vision compensatory strategies with adls.     Follow Up Recommendations  Outpatient OT (vision and balance)    Equipment Recommendations  None recommended by OT    Recommendations for Other Services       Precautions / Restrictions Precautions Precautions: Fall Precaution Comments: visual deficits left eye Restrictions Weight Bearing Restrictions: No      Mobility Bed Mobility Overal bed mobility: Modified Independent             General bed mobility comments: Received sitting in chair upon PT arrival.   Transfers Overall transfer level: Needs assistance Equipment used:  None Transfers: Sit to/from Stand Sit to Stand: Supervision         General transfer comment: first time out of bed and x1 lob    Balance Overall balance assessment: Needs assistance   Sitting balance-Leahy Scale: Normal     Standing balance support: No upper extremity supported;During functional activity Standing balance-Leahy Scale: Good               High level balance activites: Direction changes;Sudden stops;Head turns;Turns High Level Balance Comments: Tolerated higher level balance activities as mentioned above without overt LOB.            ADL Overall ADL's : Needs assistance/impaired Eating/Feeding: Modified independent;Sitting   Grooming: Wash/dry Tree surgeon: Supervision/safety;Ambulation           Functional mobility during ADLs: Min guard General ADL Comments: Pt demonstrates vision deficits listed below. pt demonstrates x1 LOB during OT evaluation. pt first time up this admission. pt with LOB turning head.      Vision Eye Alignment: Within Functional Limits   Ocular Range of Motion: Within Functional Limits Tracking/Visual Pursuits: Requires cues, head turns, or add eye shifts to track   Convergence: Impaired (comment)     Additional Comments: Pt demonstrates L eye impairments . Pt describes a central key hole shape of vision in L eye. Pt reports all other areas (superior inferior and peripheral vision abscent. Pt only in this small key hole central vision seeing object. Pt tracking object  in all quadrants and sustaining vision to object but states "abscence" of vision   Perception     Praxis      Pertinent Vitals/Pain Pain Assessment: No/denies pain     Hand Dominance Right   Extremity/Trunk Assessment Upper Extremity Assessment Upper Extremity Assessment: Overall WFL for tasks assessed   Lower Extremity Assessment Lower Extremity Assessment: Defer to PT evaluation    Cervical / Trunk Assessment Cervical / Trunk Assessment: Normal   Communication Communication Communication: No difficulties   Cognition Arousal/Alertness: Awake/alert Behavior During Therapy: WFL for tasks assessed/performed Overall Cognitive Status: Within Functional Limits for tasks assessed                     General Comments       Exercises       Shoulder Instructions      Home Living Family/patient expects to be discharged to:: Private residence Living Arrangements: Spouse/significant other Available Help at Discharge: Family;Available 24 hours/day Type of Home: Apartment Home Access: Elevator     Home Layout: One level         Bathroom Toilet: Standard     Home Equipment: None          Prior Functioning/Environment Level of Independence: Independent        Comments: Pt very active PTA. Walks on treadmill daily and does line dancing.    OT Diagnosis: Generalized weakness;Disturbance of vision   OT Problem List: Impaired vision/perception;Impaired balance (sitting and/or standing)   OT Treatment/Interventions: Self-care/ADL training;Visual/perceptual remediation/compensation;Patient/family education;Balance training    OT Goals(Current goals can be found in the care plan section) Acute Rehab OT Goals Patient Stated Goal: to return home OT Goal Formulation: With patient/family Time For Goal Achievement: 10/24/14 Potential to Achieve Goals: Good  OT Frequency: Min 2X/week   Barriers to D/C:            Co-evaluation              End of Session Equipment Utilized During Treatment: Gait belt Nurse Communication: Mobility status;Precautions  Activity Tolerance: Patient tolerated treatment well Patient left: in chair;with call bell/phone within reach;with family/visitor present   Time: 1156-1207 OT Time Calculation (min): 11 min Charges:  OT General Charges $OT Visit: 1 Procedure OT Evaluation $Initial OT Evaluation Tier I:  1 Procedure OT Treatments $Self Care/Home Management : 8-22 mins G-Codes:    Peri Maris 11-08-2014, 2:28 PM  Pager: [phone]

## 2014-10-10 NOTE — Progress Notes (Signed)
  Echocardiogram 2D Echocardiogram has been performed.  Axel Frisk 10/10/2014, 9:23 AM

## 2014-10-10 NOTE — Evaluation (Addendum)
Physical Therapy Evaluation Patient Details Name: Wendy Patrick MRN: [id number] DOB: October 31, 1946 Today's Date: 10/10/2014   History of Present Illness  Patient is a 68 y/o female with history of COPD and CAD who presents with vision loss. Per MD note, pt was watching Thursday night football when she found that she had sudden vision loss in her left eye. She states that the vision losses mostly in the center of her visual field, saying that she has somewhat preserved vision and color perception in her periphery. She states that she also has had associated photopsias. Patient also states that the vision loss was associated with throbbing temporal pain as well as intense left eye pain that was exacerbated by extraocular movements. The day after, patient was evaluated by her ophthalmologist (Dr. Katy Fitch) who suspected optic neuritis and sent her for an MRI that confirmed such findings.     Clinical Impression  Patient presents with visual loss in left eye - improved from yesterday. Pt functioning close to baseline and tolerated ambulating community distances while performing higher level balance challenges without difficulty or LOB. Pt able to compensate well using right eye. Pt does not require skilled therapy services as pt is functioning close to baseline. Pt has necessary supervision at home. Encourage ambulation daily in hall with husband or Therapist, sports. Education to be vigilant of surroundings especially with current visual loss to maintain safety. Discharge from therapy.    Follow Up Recommendations No PT follow up    Equipment Recommendations  None recommended by PT    Recommendations for Other Services       Precautions / Restrictions Precautions Precautions: None Precaution Comments: visual deficits left eye Restrictions Weight Bearing Restrictions: No      Mobility  Bed Mobility               General bed mobility comments: Received sitting in chair upon PT arrival.    Transfers Overall transfer level: Independent Equipment used: None                Ambulation/Gait Ambulation/Gait assistance: Supervision   Assistive device: None Gait Pattern/deviations: Step-through pattern;Drifts right/left     General Gait Details: Pt with steady gait. Tolerated higher level balance challenges with only minor drifting but no LOB (reports as baseline). See balance section for details.  Stairs            Wheelchair Mobility    Modified Rankin (Stroke Patients Only)       Balance Overall balance assessment: Needs assistance   Sitting balance-Leahy Scale: Normal       Standing balance-Leahy Scale: Good               High level balance activites: Direction changes;Sudden stops;Head turns;Turns High Level Balance Comments: Tolerated higher level balance activities as mentioned above without overt LOB.             Pertinent Vitals/Pain Pain Assessment: No/denies pain    Home Living Family/patient expects to be discharged to:: Private residence Living Arrangements: Spouse/significant other Available Help at Discharge: Family;Available 24 hours/day Type of Home: Apartment Home Access: Elevator     Home Layout: One level Home Equipment: None      Prior Function Level of Independence: Independent         Comments: Pt very active PTA. Walks on treadmill daily and does line dancing.     Hand Dominance        Extremity/Trunk Assessment   Upper Extremity Assessment: Defer to OT  evaluation           Lower Extremity Assessment: Overall WFL for tasks assessed         Communication   Communication: No difficulties  Cognition Arousal/Alertness: Awake/alert Behavior During Therapy: WFL for tasks assessed/performed Overall Cognitive Status: Within Functional Limits for tasks assessed                      General Comments General comments (skin integrity, edema, etc.): Pt reports the "blacked out"  spot of visual loss in left eye changes. Reports only being able to see outlines and shapes. Pt reports pain with  end range left gaze.    Exercises        Assessment/Plan    PT Assessment Patent does not need any further PT services  PT Diagnosis Difficulty walking   PT Problem List    PT Treatment Interventions     PT Goals (Current goals can be found in the Care Plan section) Acute Rehab PT Goals Patient Stated Goal: to get my vision back and return home PT Goal Formulation: All assessment and education complete, DC therapy Time For Goal Achievement: 10/24/14 Potential to Achieve Goals: Good    Frequency     Barriers to discharge        Co-evaluation               End of Session Equipment Utilized During Treatment: Gait belt Activity Tolerance: Patient tolerated treatment well Patient left: in chair;with call bell/phone within reach;with family/visitor present Nurse Communication: Mobility status         Time: 9407-6808 PT Time Calculation (min) (ACUTE ONLY): 13 min   Charges:   PT Evaluation $Initial PT Evaluation Tier I: 1 Procedure PT Treatments $Gait Training: 8-22 mins   PT G CodesCandy Sledge A 10/10/2014, 1:30 PM  Candy Sledge, [place], DPT [phone]

## 2014-10-10 NOTE — Progress Notes (Signed)
*  PRELIMINARY RESULTS* Vascular Ultrasound Carotid Duplex (Doppler) has been completed.   Findings suggest 1-39% internal carotid artery stenosis bilaterally. Vertebral arteries are patent with antegrade flow.  10/10/2014 9:28 AM Maudry Mayhew, RVT, RDCS, RDMS

## 2014-10-10 NOTE — Progress Notes (Signed)
Subjective: Patient reports some modest improvement in her vision.  Tolerating the steroids.    Objective: Current vital signs: BP 112/63 mmHg  Pulse 76  Temp(Src) 98.6 F (37 C) (Oral)  Resp 18  Ht $R'5\' 4"'zY$  (1.626 m)  Wt 56.291 kg (124 lb 1.6 oz)  BMI 21.29 kg/m2  SpO2 95% Vital signs in last 24 hours: Temp:  [97.6 F (36.4 C)-98.6 F (37 C)] 98.6 F (37 C) (11/10 1428) Pulse Rate:  [61-80] 76 (11/10 1428) Resp:  [16-18] 18 (11/10 1428) BP: (112-153)/(62-100) 112/63 mmHg (11/10 1428) SpO2:  [95 %-100 %] 95 % (11/10 1428) Weight:  [56.291 kg (124 lb 1.6 oz)] 56.291 kg (124 lb 1.6 oz) (11/09 2138)  Intake/Output from previous day:   Intake/Output this shift: Total I/O In: 240 [P.O.:240] Out: -  Nutritional status: Diet Heart  Neurologic Exam: Mental Status: Alert, oriented, thought content appropriate. Speech fluent without evidence of aphasia. Able to follow 3 step commands without difficulty. Cranial Nerves: II: Discs flat bilaterally; Visual fields grossly normal in the right eye. Vision minimal in the left eye. Some color vision spared, pupils equal, round, reactive to light and accommodation III,IV, VI: ptosis not present, extra-ocular motions intact bilaterally V,VII: smile symmetric, facial light touch sensation normal bilaterally VIII: hearing normal bilaterally IX,X: gag reflex present XI: bilateral shoulder shrug XII: midline tongue extension Motor: 5/5 throughout; no atrophy noted Sensory: Pinprick and light touch intact throughout, bilaterally Deep Tendon Reflexes: 2+ and symmetric throughout Plantars: Right: muteLeft: mute Cerebellar: normal finger-to-nose and normal heel-to-shin test  Lab Results: Basic Metabolic Panel:  Recent Labs Lab 10/09/14 1717 10/10/14 0834  NA 142 142  K 4.1 4.2  CL 103 103  CO2 26 24  GLUCOSE 96 189*  BUN 17 17  CREATININE 0.86 0.74  CALCIUM 9.2 9.3    Liver Function  Tests:  Recent Labs Lab 10/10/14 0834  AST 19  ALT 14  ALKPHOS 74  BILITOT 0.3  PROT 7.0  ALBUMIN 3.7   No results for input(s): LIPASE, AMYLASE in the last 168 hours. No results for input(s): AMMONIA in the last 168 hours.  CBC:  Recent Labs Lab 10/09/14 1717 10/10/14 0834  WBC 5.8 8.4  HGB 14.7 16.1*  HCT 45.1 49.0*  MCV 84.9 86.3  PLT 320 346    Cardiac Enzymes: No results for input(s): CKTOTAL, CKMB, CKMBINDEX, TROPONINI in the last 168 hours.  Lipid Panel: No results for input(s): CHOL, TRIG, HDL, CHOLHDL, VLDL, LDLCALC in the last 168 hours.  CBG: No results for input(s): GLUCAP in the last 168 hours.  Microbiology: No results found for this or any previous visit.  Coagulation Studies: No results for input(s): LABPROT, INR in the last 72 hours.  Imaging: No results found.  Medications:  I have reviewed the patient's current medications. Scheduled: . aspirin  81 mg Oral Daily  . heparin  5,000 Units Subcutaneous 3 times per day  . methylPREDNISolone (SOLU-MEDROL) injection  250 mg Intravenous Q6H  . pantoprazole  40 mg Oral Daily    Assessment/Plan: Patient tolerating steroids s/p 3/12 doses.  Able to see colors and light.  Vision otherwise remains impaired.  ESR, ANA, lyme titer and RPR unremarkable.  CRP pending.    Recommendations: 1.  Continue steroids   LOS: 1 day   Alexis Goodell, MD [facility] [phone] 10/10/2014  2:34 PM

## 2014-10-11 ENCOUNTER — Inpatient Hospital Stay (HOSPITAL_COMMUNITY): Payer: Medicare HMO

## 2014-10-11 DIAGNOSIS — R739 Hyperglycemia, unspecified: Secondary | ICD-10-CM

## 2014-10-11 LAB — GLUCOSE, CAPILLARY
GLUCOSE-CAPILLARY: 182 mg/dL — AB (ref 70–99)
Glucose-Capillary: 163 mg/dL — ABNORMAL HIGH (ref 70–99)
Glucose-Capillary: 173 mg/dL — ABNORMAL HIGH (ref 70–99)

## 2014-10-11 LAB — C-REACTIVE PROTEIN

## 2014-10-11 MED ORDER — HYPROMELLOSE (GONIOSCOPIC) 2.5 % OP SOLN
2.0000 [drp] | OPHTHALMIC | Status: DC | PRN
  Filled 2014-10-11: qty 15

## 2014-10-11 MED ORDER — INSULIN ASPART 100 UNIT/ML ~~LOC~~ SOLN
0.0000 [IU] | Freq: Three times a day (TID) | SUBCUTANEOUS | Status: DC
  Administered 2014-10-12 (×2): 1 [IU] via SUBCUTANEOUS
  Administered 2014-10-12: 2 [IU] via SUBCUTANEOUS
  Administered 2014-10-13: 1 [IU] via SUBCUTANEOUS
  Administered 2014-10-13: 2 [IU] via SUBCUTANEOUS

## 2014-10-11 MED ORDER — INSULIN ASPART 100 UNIT/ML ~~LOC~~ SOLN
0.0000 [IU] | Freq: Three times a day (TID) | SUBCUTANEOUS | Status: DC
Start: 2014-10-11 — End: 2014-10-11
  Administered 2014-10-11: 2 [IU] via SUBCUTANEOUS

## 2014-10-11 MED ORDER — ZOLPIDEM TARTRATE 5 MG PO TABS
5.0000 mg | ORAL_TABLET | Freq: Every evening | ORAL | Status: DC | PRN
  Administered 2014-10-11 – 2014-10-12 (×3): 5 mg via ORAL
  Filled 2014-10-11 (×3): qty 1

## 2014-10-11 MED ORDER — FLUCONAZOLE 100 MG PO TABS
400.0000 mg | ORAL_TABLET | Freq: Every day | ORAL | Status: DC
  Administered 2014-10-11 – 2014-10-13 (×3): 400 mg via ORAL
  Filled 2014-10-11 (×3): qty 4

## 2014-10-11 MED ORDER — SULFAMETHOXAZOLE-TRIMETHOPRIM 800-160 MG PO TABS
1.0000 | ORAL_TABLET | Freq: Every day | ORAL | Status: DC
  Administered 2014-10-11 – 2014-10-13 (×3): 1 via ORAL
  Filled 2014-10-11 (×3): qty 1

## 2014-10-11 NOTE — Discharge Summary (Signed)
Name: Wendy Patrick MRN: [id number] DOB: 11/12/1946 68 y.o. PCP: Pcp Not In System  Date of Admission: 10/09/2014  3:43 PM Date of Discharge: 10/11/2014 Attending Physician: Annia Belt, MD  Discharge Diagnosis:  1. Optic Neuritis  2. COPD  Principal Problem:   Optic neuritis Active Problems:   COPD (chronic obstructive pulmonary disease)   Vision loss, left eye  Discharge Medications:   Medication List    STOP taking these medications        doxycycline 100 MG tablet  Commonly known as:  VIBRA-TABS      TAKE these medications        albuterol 108 (90 BASE) MCG/ACT inhaler  Commonly known as:  PROVENTIL HFA;VENTOLIN HFA  Inhale 2 puffs into the lungs 4 (four) times daily.     aspirin 81 MG tablet  Take 81 mg by mouth daily.     aspirin EC 81 MG tablet  Take 81 mg by mouth daily.     guaiFENesin-codeine 100-10 MG/5ML syrup  Commonly known as:  GUIATUSS AC  Take 10 mLs by mouth 4 (four) times daily as needed for cough.     ibuprofen 200 MG tablet  Commonly known as:  ADVIL,MOTRIN  Take 200 mg by mouth every 6 (six) hours as needed for moderate pain.     predniSONE 20 MG tablet  Commonly known as:  DELTASONE  Take 1 tablet (20 mg total) by mouth daily with breakfast.        traMADol 50 MG tablet  Commonly known as:  ULTRAM  Take 2 tablets (100 mg total) by mouth every 8 (eight) hours as needed for pain.        Disposition and follow-up:   Ms.Wendy Patrick was discharged from [facility] in Good condition.  At the hospital follow up visit please address:  1.  Opthalmic Exam, improvement in vision, medication adherence, tolerance of steroids. Follow up with ophthalmologist.  2.  Labs / imaging needed at time of follow-up: Basic Metabolic Panel, CBC, Blood sugars- elevated on admission on steroids.  3.  Pending labs/ test needing follow-up: None  Follow-up Appointments:   - Follow Up with Outpatient  Ophthalmology  Discharge Instructions: None   Consultations: Treatment Team:  Neurology  Procedures Performed:  No results found.  2D Echo:   Study Conclusions  - Left ventricle: The cavity size was normal. Wall thickness was normal. Systolic function was normal. The estimated ejection fraction was in the range of 60% to 65%. Wall motion was normal; there were no regional wall motion abnormalities. - Mitral valve: Calcified annulus. There was no significant regurgitation. - Right ventricle: The cavity size was normal. Systolic function was normal. - Impressions: No cardiac source of embolism was identified, but cannot be ruled out on the basis of this examination.  Impressions:  - No cardiac source of embolism was identified, but cannot be ruled out on the basis of this examination.  Transthoracic echocardiography. M-mode, complete 2D, spectral Doppler, and color Doppler. Birthdate: Patient birthdate: 01/12/1946. Age: Patient is 67 yr old. Sex: Gender: female. BMI: 21.3 kg/m^2. Blood pressure:   133/76 Patient status: Inpatient. Study date: Study date: 10/10/2014. Study time: 08:57 AM. Location: Echo laboratory.  Carotid Dopplers:  Summary: Findings suggest 1-39% internal carotid artery stenosis bilaterally. Vertebral arteries are patent with antegrade flow.  Other specific details can be found in the table(s) above. Prepared and Electronically Authenticated by  Leia Alf, MD 2015-11-10T19:41:46  Admission HPI:   Patient is a 68 year old female with a history of COPD and coronary artery disease who presents with vision loss. Patient states that on October 29, she underwent a minor procedure at the dentist office, after which she noticed having increased frequency and severity of headaches. Patient states that she has been having headaches and congestion for several months now, and thinks that it may be sinus related.   About a  week later, patient was watching Thursday night football when she found that she had sudden vision loss in her left eye. She states that the vision losses mostly in the center of her visual field, saying that she has somewhat preserved vision and color perception in her periphery. She states that she also has had associated photopsias. Patient also states that the vision loss was associated with throbbing temporal pain as well as intense left eye pain that was exacerbated by extraocular movements. The day after, patient was evaluated by her ophthalmologist (Dr. Katy Fitch) who suspected optic neuritis and sent her for an MRI that confirmed such findings.   Patient was to be evaluated by neurology, however, she was not able to get into clinic in a short time period, so Dr. Katy Fitch advised that she be evaluated and treated in the hospital. In the emergency department, patient received 250 mg Solu-Medrol and neurology was consulted.  Upon interview, patient reports that her visual loss has been stable, although the pain in her eye and temporal area had completely resolved. Patient denied any jaw claudication or a significant history of morning stiffness or shoulder pain. Patient denies any personal or family history of multiple sclerosis. Patient only reports cataract surgery in her right eye and no eye surgeries in her left. Patient otherwise denies any fevers, chills, chest pain, dyspnea, abdominal pain, constipation, diarrhea, nausea, or vomiting.  MRI Impression- Basal cisterns, ventricles and cortical sulci are appropriate for the patient's age. There is no shift of midline structures. Axial FLAIR imaging demonstrates patchy and focal increased signal within the cerebral white matter tracts bilaterally, somewhat more pronounced on the left than the right consistent with senescent microvascular ischemic change. There is no intracranial hemorrhage or mass. No extra-axial collections are seen. Diffusion weighted  imaging is negative for restricted diffusion and acute infarction.  There is definite optic nerve asymmetry with moderate swelling of the left optic nerve that measures approximately 6 mm in transverse diameter compared with approximately 4 mm in transverse diameter on the right at the same level. Following contrast administration, the left optic nerve demonstrates fairly marked diffuse enhancement. Refer to series 20 postcontrast T1 coronal images 6 through 10.  Impression: Relatively mild cerebral white matter senescent microvascular ischemic change. No acute intracranial process evident. Findings of left optic neuritis as described above.    Hospital Course by problem list:   1. Optic Neuritis: Pt came in with sudden vision loss in Left eye. Pt had an MRI done prior to arrival- Which showed feats supportive of optic neuritis. Please refer to her original admission history and physical by Dr. Luan Moore.    Differentials considered-  Multiple sclerosis and temporal arteritis ( Though no real history of Jaw claudications, but some reported mild headache). The patient's admission CBC, BMP, and Liver function tests returned normal. An ESR returned at 2, CRP at < 2, ANA negative, Lyme titer, HIV and RPR negative as well. She also had a 2D echocardiogram which showed no cardiac source of emboli (resutls above) and carotid dopplers showing  1-39% stenosis bilaterally. She was started on IV solumedrol 250mg  every 6 hours for a total of 12 doses, and also started on pantoprazole for GI prophylaxis while on solumedrol. She was also started on antifungal prophylaxis with Fluconazole, and bactrim during treatment with high dose steroids.   The patient tolerated treatment with the solumedrol well, her only side effects were some sleeplessness at night which resolved with Ambien, and some erythema of her cheeks.  She originally had some pain with extra-ocular movement in the left eye. By discharge- Vision had  markedly improved- could see colors again in her left eye, and was able to see shapes and outlines more clearly and could read a few letters transiently, her pain had also completely resolved.  Considering high doses of steroids patient was discharged on a asteroid taper- Prednisone- 60mg  X2 days, 40mg  X2 days, 20mg  for 2 days and then 10mg  for 2 days. Bactrim and Fluconazole was discontinued on discharge.  2. Hyperglycemia: Due to high dose steroid. Blood sugars- high 100s to low 200s. Required 1-2 units of sliding scale insulin.  3. COPD: The patient had no acute events related to her COPD while admitted and was maintained on home albuterol. A CXR was performed to rule out any lung involvement or secondary cause of her optic neuritis but returned normal except for some emphysema.  4. CAD: The patient had taken Crestor in the past due to an unclear history of possible CAD, she denies any history stenting or cardiac surgery. She had stopped taking Crestor due to severe myalgias and we held this medication for the duration of her admission.  Discharge Vitals:   BP 126/53 mmHg  Pulse 66  Temp(Src) 97.7 F (36.5 C) (Oral)  Resp 18  Ht 5\' 4"  (1.626 m)  Wt 56.291 kg (124 lb 1.6 oz)  BMI 21.29 kg/m2  SpO2 98%  Discharge Labs:  Results for orders placed or performed during the hospital encounter of 10/09/14 (from the past 24 hour(s))  Glucose, capillary     Status: Abnormal   Collection Time: 10/10/14  3:18 PM  Result Value Ref Range   Glucose-Capillary 169 (H) 70 - 99 mg/dL  Glucose, capillary     Status: Abnormal   Collection Time: 10/10/14  6:13 PM  Result Value Ref Range   Glucose-Capillary 176 (H) 70 - 99 mg/dL  Glucose, capillary     Status: Abnormal   Collection Time: 10/10/14 11:51 PM  Result Value Ref Range   Glucose-Capillary 182 (H) 70 - 99 mg/dL  Glucose, capillary     Status: Abnormal   Collection Time: 10/11/14  6:34 AM  Result Value Ref Range   Glucose-Capillary 163 (H)  70 - 99 mg/dL    Signed: Bethena Roys, MD 10/11/2014, 1:36 PM   Services Ordered on Discharge: None Equipment Ordered on Discharge: None

## 2014-10-11 NOTE — Progress Notes (Signed)
Subjective: Patient reports that she is having difficulty sleeping at night.  Was able to get a sleeping pill last evening and reports that she feels better today.  Vision improved as well.    Objective: Current vital signs: BP 118/71 mmHg  Pulse 94  Temp(Src) 97.8 F (36.6 C) (Oral)  Resp 18  Ht 5\' 4"  (1.626 m)  Wt 56.291 kg (124 lb 1.6 oz)  BMI 21.29 kg/m2  SpO2 98% Vital signs in last 24 hours: Temp:  [97.5 F (36.4 C)-98.6 F (37 C)] 97.8 F (36.6 C) (11/11 0624) Pulse Rate:  [76-94] 94 (11/11 0624) Resp:  [18] 18 (11/11 0624) BP: (112-159)/(63-71) 118/71 mmHg (11/11 0624) SpO2:  [94 %-98 %] 98 % (11/11 0624)  Intake/Output from previous day: 11/10 0701 - 11/11 0700 In: 480 [P.O.:480] Out: -  Intake/Output this shift:   Nutritional status: Diet Heart  Neurologic Exam: Mental Status: Alert, oriented, thought content appropriate.  Speech fluent without evidence of aphasia.  Able to follow 3 step commands without difficulty. Cranial Nerves: II: Discs flat bilaterally; Visual fields grossly normal in the right eye.  Has a small inferior quadrant where unable to see with right eye but otherwise able to make out objects, etc.  Pupils equal, round, reactive to light and accommodation III,IV, VI: ptosis not present, extra-ocular motions intact bilaterally V,VII: smile symmetric, facial light touch sensation normal bilaterally VIII: hearing normal bilaterally IX,X: gag reflex present XI: bilateral shoulder shrug XII: midline tongue extension Motor: 5/5 throughout; no atrophy noted Sensory: Pinprick and light touch intact throughout, bilaterally Deep Tendon Reflexes: 2+ and symmetric throughout Plantars: Right: mute   Left: mute   Lab Results: Basic Metabolic Panel:  Recent Labs Lab 10/09/14 1717 10/10/14 0834  NA 142 142  K 4.1 4.2  CL 103 103  CO2 26 24  GLUCOSE 96 189*  BUN 17 17  CREATININE 0.86 0.74  CALCIUM 9.2 9.3    Liver Function Tests:  Recent  Labs Lab 10/10/14 0834  AST 19  ALT 14  ALKPHOS 74  BILITOT 0.3  PROT 7.0  ALBUMIN 3.7   No results for input(s): LIPASE, AMYLASE in the last 168 hours. No results for input(s): AMMONIA in the last 168 hours.  CBC:  Recent Labs Lab 10/09/14 1717 10/10/14 0834  WBC 5.8 8.4  HGB 14.7 16.1*  HCT 45.1 49.0*  MCV 84.9 86.3  PLT 320 346    Cardiac Enzymes: No results for input(s): CKTOTAL, CKMB, CKMBINDEX, TROPONINI in the last 168 hours.  Lipid Panel: No results for input(s): CHOL, TRIG, HDL, CHOLHDL, VLDL, LDLCALC in the last 168 hours.  CBG:  Recent Labs Lab 10/10/14 1518 10/10/14 1813 10/10/14 2351 10/11/14 0634  GLUCAP 169* 176* 182* 163*    Microbiology: No results found for this or any previous visit.  Coagulation Studies: No results for input(s): LABPROT, INR in the last 72 hours.  Imaging: No results found.  Medications:  I have reviewed the patient's current medications. Scheduled: . aspirin  81 mg Oral Daily  . heparin  5,000 Units Subcutaneous 3 times per day  . methylPREDNISolone (SOLU-MEDROL) injection  250 mg Intravenous Q6H  . pantoprazole  40 mg Oral Daily    Assessment/Plan: Patient improving on steroids.  Now s/p 6/12 doses.  Only complaint is that of sleep.  Blood sugars have been elevated as well.  Echocardiogram and carotid dopplers were unremarkable.  CRP remains pending.    Recommendations: 1.  Continue complete course of steroids.  No taper will be required at end.   2.  Agree with Ambien nightly for sleep.     LOS: 2 days   Alexis Goodell, MD [facility] [phone] 10/11/2014  9:05 AM

## 2014-10-11 NOTE — Progress Notes (Signed)
Subjective:  Wendy Patrick reports more modest improvements in her vision in the left eye. She is able to make out outlines and shapes more clearly, and is now able to see some flecks of color. She had a difficult time falling asleep last night, she was given Ambien and slept soundly afterwards. Otherwise, she denies any new focal neurological symptoms.   Objective: Vital signs in last 24 hours: Filed Vitals:   10/10/14 1824 10/10/14 2157 10/11/14 0315 10/11/14 0624  BP: 145/68 159/67 148/64 118/71  Pulse: 85 87 86 94  Temp: 97.9 F (36.6 C) 97.5 F (36.4 C) 97.6 F (36.4 C) 97.8 F (36.6 C)  TempSrc: Oral Oral Oral Oral  Resp: 18 18 18 18   Height:      Weight:      SpO2: 97% 94% 96% 98%   Weight change:   Intake/Output Summary (Last 24 hours) at 10/11/14 0845 Last data filed at 10/10/14 1816  Gross per 24 hour  Intake    480 ml  Output      0 ml  Net    480 ml   General appearance: alert, cooperative, appears stated age and no distress Head: Normocephalic, without obvious abnormality, atraumatic Eyes: conjunctivae/corneas clear. Pupils equal and reactive to light, left eye is reacting normally now. No discomfort with EOMs Throat: lips, mucosa, and tongue normal; teeth and gums normal Back: symmetric, no curvature. ROM normal. No CVA tenderness. Lungs: clear to auscultation bilaterally Heart: regular rate and rhythm, S1, S2 normal, no murmur, click, rub or gallop Abdomen: soft, non-tender; bowel sounds normal; no masses,  no organomegaly Extremities: extremities normal, atraumatic, no cyanosis or edema Neurologic: Mental status: Alert, oriented, thought content appropriate CNII: Visual acuity and visual fields normal in R eye.  L eye acuity is still diminished but improved over yesterday, could make out shape and color of my tie. Visual fields also improved, able to distinguish with ease in both eyes. Motor: 5/5 in all extremities Reflexes: 2+ and symmetric Coordination:  normal  Lab Results: Results for orders placed or performed during the hospital encounter of 10/09/14 (from the past 24 hour(s))  Glucose, capillary     Status: Abnormal   Collection Time: 10/10/14  3:18 PM  Result Value Ref Range   Glucose-Capillary 169 (H) 70 - 99 mg/dL  Glucose, capillary     Status: Abnormal   Collection Time: 10/10/14  6:13 PM  Result Value Ref Range   Glucose-Capillary 176 (H) 70 - 99 mg/dL  Glucose, capillary     Status: Abnormal   Collection Time: 10/10/14 11:51 PM  Result Value Ref Range   Glucose-Capillary 182 (H) 70 - 99 mg/dL  Glucose, capillary     Status: Abnormal   Collection Time: 10/11/14  6:34 AM  Result Value Ref Range   Glucose-Capillary 163 (H) 70 - 99 mg/dL   Micro Results: No results found for this or any previous visit (from the past 240 hour(s)). Studies/Results: No results found. Medications: I have reviewed the patient's current medications. Scheduled Meds: . aspirin  81 mg Oral Daily  . heparin  5,000 Units Subcutaneous 3 times per day  . methylPREDNISolone (SOLU-MEDROL) injection  250 mg Intravenous Q6H  . pantoprazole  40 mg Oral Daily   Continuous Infusions:  PRN Meds:.acetaminophen **OR** acetaminophen, zolpidem Assessment/Plan: Principal Problem:   Optic neuritis Active Problems:   COPD (chronic obstructive pulmonary disease)   Vision loss, left eye  Optic neuritis: Patient with sudden loss of vision in the  left eye since Thursday. Patient with an outside MRI that showing optic nerve hyperintensity consistent with optic neuritis. Patient denies a history of multiple sclerosis. Echo revealed no evidence of cardiac source of thrombus, dopplers showed 1-39% carotid stenosis bilaterally. She was ANA negative, RPR non-reactive, and HIV non-reactive, lyme negative. Other etiologies could include sarcoid, paraneoplastic optic neuritis, neuromyelitis optica, idiopathic isolated optic neuritis, and others as the differential is quite  broad in an elderly patient. Given high dosage of steroids she will start antifungal and antibacterial prophylaxis. - Continue Solu-Medrol 250 mg every 6 hours for 12 doses (Day 2)   - Transition to oral prednisone 60 mg afterwards  - CXR to evaluate for any underlying lung findings that could suggest a diagnosis  - Start Fluconazole 400mg  daily for antifungal prophylaxis.  - Start TMP-SMX 800-160mg  daily for PCP prophylaxis - Spoke with Dr. Katy Fitch over the phone who says that patient can follow-up as an outpatient in 2 weeks. - Follow-up with neurology as an outpatient as 30% of individuals with developmental multiple sclerosis of 5 years.  Hyperglycemia: Patient had a history of gestational diabetes for which she took insulin daily, unfortunately she developed one episode of insulin shock. Has been hyperglycemic with steroid treatment as expected, may need to consider judicious use of insulin.  - Start SSI  COPD: clear to auscultation bilaterally on exam. No acute issues. Etiology believed to be secondary to long time smoking. - Continue home albuterol  Coronary artery disease: Patient reports some kind of procedure that showed 40% occlusion. Patient denies any history of stenting or cardiac surgery. Patient reports having myopathies in response to Crestor. She has not been taking this lately. - Continue home aspirin 81 mg daily - Hold home Crestor 20 mg daily  Diet: heart healthy diet Prophylaxis: heparin subcutaneous Code: full code  Dispo: Disposition is deferred at this time, awaiting improvement of current medical problems. Anticipated discharge in approximately 2 day(s).   The patient does not have a current PCP (Pcp Not In System) and does need an [facility] hospital follow-up appointment after discharge.  The patient does not have transportation limitations that hinder transportation to clinic appointments.  This is a Careers information officer Note.  The care of the patient was discussed  with Dr. Denton Brick and the assessment and plan formulated with their assistance.  Please see their attached note for official documentation of the daily encounter.   LOS: 2 days   Abran Duke, Med Student 10/11/2014, 8:45 AM

## 2014-10-11 NOTE — Progress Notes (Signed)
Patient ID: Wendy Patrick, female   DOB: 22-Nov-1946, 68 y.o.   MRN: [id number] Medicine attending: I personally interviewed and examined this woman this morning and I tested the accuracy of the exam and evaluation as recorded by medical student Mr. Prudencio Burly and resident physician Dr. Denton Brick. We appreciate neurology input. Ancillary studies all unrevealing so far. She is responding well to high-dose steroids. Plan continue full course.

## 2014-10-11 NOTE — Progress Notes (Signed)
Inpatient Diabetes Program Recommendations  AACE/ADA: New Consensus Statement on Inpatient Glycemic Control  Target Ranges:  Prepandial:   less than 140 mg/dL      Peak postprandial:   less than 180 mg/dL (1-2 hours)      Critically ill patients:  140 - 180 mg/dL  Pager:  [phone] Hours:  8 am-10pm   Reason for Visit: Steroid induced Hyperglycemia  Results for Wendy Patrick, Wendy Patrick (MRN [id number]) as of 10/11/2014 08:35  Ref. Range 10/10/2014 15:18 10/10/2014 18:13 10/10/2014 23:51 10/11/2014 06:34  Glucose-Capillary Latest Range: 70-99 mg/dL 169 (H) 176 (H) 182 (H) 163 (H)    Inpatient Diabetes Program Recommendations Correction (SSI): Consider adding Novolog Sensitive correction while in IV steroids  Courtney Heys PhD, RN, BC-ADM Diabetes Coordinator  Office:  [phone] Team Pager:  [phone]

## 2014-10-12 LAB — GLUCOSE, CAPILLARY
GLUCOSE-CAPILLARY: 142 mg/dL — AB (ref 70–99)
Glucose-Capillary: 152 mg/dL — ABNORMAL HIGH (ref 70–99)
Glucose-Capillary: 134 mg/dL — ABNORMAL HIGH (ref 70–99)
Glucose-Capillary: 151 mg/dL — ABNORMAL HIGH (ref 70–99)

## 2014-10-12 NOTE — Progress Notes (Signed)
Subjective:  Wendy Patrick felt well overnight, her vision continues to improve in the left eye and is now able to make out colors, outlines, and shapes more clearly, however still has marked deficits over baseline. Otherwise, she denies any other new symptoms.   Objective: Vital signs in last 24 hours: Filed Vitals:   10/11/14 1726 10/11/14 2127 10/12/14 0126 10/12/14 0524  BP: 118/68 128/67 143/75 146/64  Pulse: 69 60 72 65  Temp: 97.9 F (36.6 C) 98.1 F (36.7 C) 97.8 F (36.6 C) 97.9 F (36.6 C)  TempSrc: Oral Oral Oral Oral  Resp: 18 18 18 18   Height:      Weight:      SpO2: 98% 96% 97% 96%   Weight change:   Intake/Output Summary (Last 24 hours) at 10/12/14 0946 Last data filed at 10/11/14 1300  Gross per 24 hour  Intake    200 ml  Output      0 ml  Net    200 ml   General appearance: alert, cooperative, appears stated age and no distress Head: Normocephalic, without obvious abnormality, atraumatic Eyes: conjunctivae/corneas clear. Pupils equal and reactive to light, left eye is reacting normally now. No discomfort with EOMs Back: symmetric, no curvature. ROM normal. No CVA tenderness. Lungs: clear to auscultation bilaterally Heart: regular rate and rhythm, S1, S2 normal, no murmur, click, rub or gallop Abdomen: soft, non-tender; bowel sounds normal; no masses,  no organomegaly Extremities: extremities normal, atraumatic, no cyanosis or edema Neurologic: Mental status: Alert, oriented, thought content appropriate CNII: Visual acuity and visual fields normal in R eye.  L eye acuity is still diminished however improved today and she was able to read some letters. Visual fields also improved, able to distinguish with ease in both eyes. Motor: 5/5 in all extremities Reflexes: 2+ and symmetric Coordination: normal  Lab Results: Results for orders placed or performed during the hospital encounter of 10/09/14 (from the past 24 hour(s))  Glucose, capillary     Status:  Abnormal   Collection Time: 10/11/14  5:24 PM  Result Value Ref Range   Glucose-Capillary 173 (H) 70 - 99 mg/dL  Glucose, capillary     Status: Abnormal   Collection Time: 10/12/14  6:42 AM  Result Value Ref Range   Glucose-Capillary 152 (H) 70 - 99 mg/dL   Micro Results: No results found for this or any previous visit (from the past 240 hour(s)). Studies/Results: Dg Chest 2 View  10/11/2014   CLINICAL DATA:  Cough for 2 months.  EXAM: CHEST  2 VIEW  COMPARISON:  August 01, 2013  FINDINGS: The heart size and mediastinal contours are within normal limits. There is no focal infiltrate, pulmonary edema, or pleural effusion. There is mild scar of lateral left lung base. The lungs are hyperinflated. The visualized skeletal structures are stable.  IMPRESSION: No active cardiopulmonary disease.  Emphysema.   Electronically Signed   By: Abelardo Diesel M.D.   On: 10/11/2014 16:08   Medications: I have reviewed the patient's current medications. Scheduled Meds: . aspirin  81 mg Oral Daily  . fluconazole  400 mg Oral Daily  . heparin  5,000 Units Subcutaneous 3 times per day  . insulin aspart  0-9 Units Subcutaneous TID WC  . methylPREDNISolone (SOLU-MEDROL) injection  250 mg Intravenous Q6H  . pantoprazole  40 mg Oral Daily  . sulfamethoxazole-trimethoprim  1 tablet Oral Daily   Continuous Infusions:  PRN Meds:.acetaminophen **OR** acetaminophen, hydroxypropyl methylcellulose / hypromellose, zolpidem Assessment/Plan: Principal Problem:  Optic neuritis Active Problems:   COPD (chronic obstructive pulmonary disease)   Vision loss, left eye  Optic neuritis: Patient with sudden loss of vision in the left eye since Thursday. Patient with an outside MRI that showing optic nerve hyperintensity consistent with optic neuritis. Patient denies a history of multiple sclerosis. Echo revealed no evidence of cardiac source of thrombus, dopplers showed 1-39% carotid stenosis bilaterally. She was ANA  negative, RPR non-reactive, and HIV non-reactive, lyme negative. Other etiologies could include sarcoid, paraneoplastic optic neuritis, neuromyelitis optica, idiopathic isolated optic neuritis, and others as the differential is quite broad in an elderly patient. Given high dosage of steroids she will start antifungal and antibacterial prophylaxis. - Continue Solu-Medrol 250 mg every 6 hours for 12 doses (Day 3), her last dose will be this evening.   - Transition to oral prednisone 60 mg afterwards  - CXR to evaluate for any underlying lung findings that could suggest a diagnosis  - Fluconazole 400mg  daily for antifungal prophylaxis, d/c at discharge  - TMP-SMX 800-160mg  daily for PCP prophylaxis, d/c at discharge - Spoke with Dr. Katy Fitch over the phone who says that patient can follow-up as an outpatient in 2 weeks. - Follow-up with neurology as an outpatient as 30% of individuals with developmental multiple sclerosis of 5 years.  Hyperglycemia: Patient had a history of gestational diabetes for which she took insulin daily, unfortunately she developed one episode of insulin shock. Has been hyperglycemic with steroid treatment as expected, may need to consider judicious use of insulin. Ranging 150s-180s, has gotten 4u insulin overnight.  - SSI  COPD: clear to auscultation bilaterally on exam. No acute issues. Etiology believed to be secondary to long time smoking. - Continue home albuterol  Coronary artery disease: Patient reports some kind of procedure that showed 40% occlusion. Patient denies any history of stenting or cardiac surgery. Patient reports having myopathies in response to Crestor. She has not been taking this lately. - Continue home aspirin 81 mg daily - Hold home Crestor 20 mg daily  Diet: heart healthy diet Prophylaxis: heparin subcutaneous Code: full code  Dispo: Disposition is deferred at this time, awaiting improvement of current medical problems. Anticipated discharge  in approximately 1 day(s).   The patient does not have a current PCP (Pcp Not In System) and does need an [facility] hospital follow-up appointment after discharge.  The patient does not have transportation limitations that hinder transportation to clinic appointments.  This is a Careers information officer Note.  The care of the patient was discussed with Dr. Denton Brick and the assessment and plan formulated with their assistance.  Please see their attached note for official documentation of the daily encounter.   LOS: 3 days   Abran Duke, Med Student 10/12/2014, 9:46 AM

## 2014-10-12 NOTE — Progress Notes (Signed)
Subjective: Patient reports no change today.  She continues to tolerate the Solumedrol.    Objective: Current vital signs: BP 111/51 mmHg  Pulse 63  Temp(Src) 97.5 F (36.4 C) (Oral)  Resp 18  Ht 5\' 4"  (1.626 m)  Wt 56.291 kg (124 lb 1.6 oz)  BMI 21.29 kg/m2  SpO2 94% Vital signs in last 24 hours: Temp:  [97.5 F (36.4 C)-98.1 F (36.7 C)] 97.5 F (36.4 C) (11/12 0945) Pulse Rate:  [60-72] 63 (11/12 0945) Resp:  [18] 18 (11/12 0945) BP: (109-146)/(50-75) 111/51 mmHg (11/12 0945) SpO2:  [93 %-98 %] 94 % (11/12 0945)  Intake/Output from previous day: 11/11 0701 - 11/12 0700 In: 200 [P.O.:200] Out: -  Intake/Output this shift:   Nutritional status: Diet Heart  Neurologic Exam: Mental Status: Alert, oriented, thought content appropriate. Speech fluent without evidence of aphasia. Able to follow 3 step commands without difficulty. Cranial Nerves: II: Discs flat bilaterally; Visual fields grossly normal in the right eye. Has a small inferior quadrant where unable to see with right eye but otherwise able to make out objects, etc. Pupils equal, round, reactive to light and accommodation III,IV, VI: ptosis not present, extra-ocular motions intact bilaterally V,VII: smile symmetric, facial light touch sensation normal bilaterally VIII: hearing normal bilaterally IX,X: gag reflex present XI: bilateral shoulder shrug XII: midline tongue extension Motor: 5/5 throughout; no atrophy noted Sensory: Pinprick and light touch intact throughout, bilaterally Deep Tendon Reflexes: 2+ and symmetric throughout Plantars: Right: muteLeft: mute  Lab Results: Basic Metabolic Panel:  Recent Labs Lab 10/09/14 1717 10/10/14 0834  NA 142 142  K 4.1 4.2  CL 103 103  CO2 26 24  GLUCOSE 96 189*  BUN 17 17  CREATININE 0.86 0.74  CALCIUM 9.2 9.3    Liver Function Tests:  Recent Labs Lab 10/10/14 0834  AST 19  ALT 14  ALKPHOS 74  BILITOT 0.3  PROT  7.0  ALBUMIN 3.7   No results for input(s): LIPASE, AMYLASE in the last 168 hours. No results for input(s): AMMONIA in the last 168 hours.  CBC:  Recent Labs Lab 10/09/14 1717 10/10/14 0834  WBC 5.8 8.4  HGB 14.7 16.1*  HCT 45.1 49.0*  MCV 84.9 86.3  PLT 320 346    Cardiac Enzymes: No results for input(s): CKTOTAL, CKMB, CKMBINDEX, TROPONINI in the last 168 hours.  Lipid Panel: No results for input(s): CHOL, TRIG, HDL, CHOLHDL, VLDL, LDLCALC in the last 168 hours.  CBG:  Recent Labs Lab 10/10/14 2351 10/11/14 0634 10/11/14 1724 10/12/14 0642 10/12/14 1116  GLUCAP 182* 163* 173* 152* 142*    Microbiology: No results found for this or any previous visit.  Coagulation Studies: No results for input(s): LABPROT, INR in the last 72 hours.  Imaging: Dg Chest 2 View  10/11/2014   CLINICAL DATA:  Cough for 2 months.  EXAM: CHEST  2 VIEW  COMPARISON:  August 01, 2013  FINDINGS: The heart size and mediastinal contours are within normal limits. There is no focal infiltrate, pulmonary edema, or pleural effusion. There is mild scar of lateral left lung base. The lungs are hyperinflated. The visualized skeletal structures are stable.  IMPRESSION: No active cardiopulmonary disease.  Emphysema.   Electronically Signed   By: Abelardo Diesel M.D.   On: 10/11/2014 16:08    Medications:  I have reviewed the patient's current medications. Scheduled: . aspirin  81 mg Oral Daily  . fluconazole  400 mg Oral Daily  . heparin  5,000 Units  Subcutaneous 3 times per day  . insulin aspart  0-9 Units Subcutaneous TID WC  . methylPREDNISolone (SOLU-MEDROL) injection  250 mg Intravenous Q6H  . pantoprazole  40 mg Oral Daily  . sulfamethoxazole-trimethoprim  1 tablet Oral Daily    Assessment/Plan: Patient s/p 10/12 doses.  Tolerating well.  Will finish last dose this evening and should be stable for discharge in the AM.  Patient aware that further improvement may be experienced even after  IV infusions.    Recommendations: 1.  No steroid taper required at discharge 2.  F/U with ophthalmology at discharge.     LOS: 3 days   Alexis Goodell, MD [facility] [phone] 10/12/2014  12:11 PM

## 2014-10-12 NOTE — Progress Notes (Signed)
Subjective: NAEON. Pt feels she is doing better this AM. Is excitedly anticipating discharge home soon.   Objective: Vital signs in last 24 hours: Filed Vitals:   10/11/14 2127 10/12/14 0126 10/12/14 0524 10/12/14 0945  BP: 128/67 143/75 146/64 111/51  Pulse: 60 72 65 63  Temp: 98.1 F (36.7 C) 97.8 F (36.6 C) 97.9 F (36.6 C) 97.5 F (36.4 C)  TempSrc: Oral Oral Oral Oral  Resp: 18 18 18 18   Height:      Weight:      SpO2: 96% 97% 96% 94%   Weight change:   Intake/Output Summary (Last 24 hours) at 10/12/14 1001 Last data filed at 10/11/14 1300  Gross per 24 hour  Intake    200 ml  Output      0 ml  Net    200 ml   General: resting in bed, NAD HEENT: PERRL, painful lateral gaze but EOMI, no scleral icterus Cardiac: RRR, no rubs, murmurs or gallops Pulm: clear to auscultation bilaterally, moving normal volumes of air Abd: soft, nontender, nondistended, BS present Ext: warm and well perfused, no pedal edema Neuro: alert and oriented X3, cranial nerves II-XII grossly intact  Lab Results: Basic Metabolic Panel:  Recent Labs Lab 10/09/14 1717 10/10/14 0834  NA 142 142  K 4.1 4.2  CL 103 103  CO2 26 24  GLUCOSE 96 189*  BUN 17 17  CREATININE 0.86 0.74  CALCIUM 9.2 9.3   Liver Function Tests:  Recent Labs Lab 10/10/14 0834  AST 19  ALT 14  ALKPHOS 74  BILITOT 0.3  PROT 7.0  ALBUMIN 3.7   CBC:  Recent Labs Lab 10/09/14 1717 10/10/14 0834  WBC 5.8 8.4  HGB 14.7 16.1*  HCT 45.1 49.0*  MCV 84.9 86.3  PLT 320 346   CBG:  Recent Labs Lab 10/10/14 1518 10/10/14 1813 10/10/14 2351 10/11/14 0634 10/11/14 1724 10/12/14 0642  GLUCAP 169* 176* 182* 163* 173* 152*   Micro Results: No results found for this or any previous visit (from the past 240 hour(s)). Studies/Results: Dg Chest 2 View  10/11/2014   CLINICAL DATA:  Cough for 2 months.  EXAM: CHEST  2 VIEW  COMPARISON:  August 01, 2013  FINDINGS: The heart size and mediastinal  contours are within normal limits. There is no focal infiltrate, pulmonary edema, or pleural effusion. There is mild scar of lateral left lung base. The lungs are hyperinflated. The visualized skeletal structures are stable.  IMPRESSION: No active cardiopulmonary disease.  Emphysema.   Electronically Signed   By: Abelardo Diesel M.D.   On: 10/11/2014 16:08   Medications: I have reviewed the patient's current medications. Scheduled Meds: . aspirin  81 mg Oral Daily  . fluconazole  400 mg Oral Daily  . heparin  5,000 Units Subcutaneous 3 times per day  . insulin aspart  0-9 Units Subcutaneous TID WC  . methylPREDNISolone (SOLU-MEDROL) injection  250 mg Intravenous Q6H  . pantoprazole  40 mg Oral Daily  . sulfamethoxazole-trimethoprim  1 tablet Oral Daily   Continuous Infusions:  PRN Meds:.acetaminophen **OR** acetaminophen, hydroxypropyl methylcellulose / hypromellose, zolpidem Assessment/Plan: 1. Optic neuritis: has greatly improved with treatment. All other etiology workup has been negative.  -continue Solumedrol 250mg  q6 will complete last dose -transition to prednisone 60mg  on 10/13/14 -will cont fluconazole until transition to oral steroids -will also continue bactrim until transition to oral steroids  2. Hyperglycemia in setting of steroids:  Pt has a remote hx of gestational  DM and her CBGs 150-170s -cont SSI  Dispo: Disposition is deferred at this time, awaiting improvement of current medical problems.  Anticipated discharge in approximately 1-2 day(s).   The patient does not have a current PCP (Pcp Not In System) and does need an [facility] hospital follow-up appointment after discharge.  The patient does not have transportation limitations that hinder transportation to clinic appointments.  .Services Needed at time of discharge: Y = Yes, Blank = No PT:   OT:   RN:   Equipment:   Other:     LOS: 3 days   Clinton Gallant, MD 10/12/2014, 10:01 AM

## 2014-10-13 LAB — GLUCOSE, CAPILLARY
GLUCOSE-CAPILLARY: 149 mg/dL — AB (ref 70–99)
GLUCOSE-CAPILLARY: 151 mg/dL — AB (ref 70–99)

## 2014-10-13 MED ORDER — PREDNISONE 20 MG PO TABS: 20.0000 mg | ORAL_TABLET | Freq: Every day | ORAL | Status: DC

## 2014-10-13 NOTE — Progress Notes (Signed)
Occupational Therapy Treatment Patient Details Name: Wendy Patrick MRN: [id number] DOB: 02-Oct-1946 Today's Date: 10/13/2014    History of present illness Patient is a 68 y/o female with history of COPD and CAD who presents with vision loss. Per MD note, pt was watching Thursday night football when she found that she had sudden vision loss in her left eye. She states that the vision losses mostly in the center of her visual field, saying that she has somewhat preserved vision and color perception in her periphery. She states that she also has had associated photopsias. Patient also states that the vision loss was associated with throbbing temporal pain as well as intense left eye pain that was exacerbated by extraocular movements. The day after, patient was evaluated by her ophthalmologist (Dr. Dione Booze) who suspected optic neuritis and sent her for an MRI that confirmed such findings.    OT comments  Pt with no further acute OT needs identified. All education has been completed and the patient has no further questions. See below for any follow-up Occupational Therapy or equipment needs. OT to sign off.   Follow Up Recommendations  Outpatient OT (vision and balance)    Equipment Recommendations  None recommended by OT    Recommendations for Other Services      Precautions / Restrictions Precautions Precautions: Fall Precaution Comments: visual deficits left eye Restrictions Weight Bearing Restrictions: No       Mobility Bed Mobility Overal bed mobility: Modified Independent             General bed mobility comments: HOB elevated as pt was finishing breakfast  Transfers Overall transfer level: Independent Equipment used: None Transfers: Sit to/from Stand Sit to Stand: Modified independent (Device/Increase time)         General transfer comment: Incr time due to pt feeling weak having been in bed for several days    Balance Overall balance assessment: Needs  assistance Sitting-balance support: No upper extremity supported;Feet supported Sitting balance-Leahy Scale: Normal     Standing balance support: No upper extremity supported;During functional activity Standing balance-Leahy Scale: Good Standing balance comment: LOB x2 when turning head and bending forward while getting dressed in bathroom                   ADL Overall ADL's : Needs assistance/impaired     Grooming: Wash/dry hands;Wash/dry face;Oral care;Applying deodorant;Modified independent;Standing   Upper Body Bathing: Modified independent;Standing   Lower Body Bathing: Modified independent;Sit to/from stand   Upper Body Dressing : Modified independent;Standing   Lower Body Dressing: Modified independent;Sit to/from stand               Functional mobility during ADLs: Modified independent General ADL Comments: Pt able to locate and gather all items needed to complete bathing and dressing. Pt demonstrates x2 LOB during session. Pt with LOB when turning head and bending down to don underwear.      Vision Eye Alignment: Within Functional Limits Alignment/Gaze Preference: Within Defined Limits Ocular Range of Motion: Within Functional Limits     Convergence: Within functional limits     Additional Comments: L side visual impairments have improved. Pt reports only remaining impairment is in L inferior peripheral field. Pt reports seeing a shadow of the object when placed in L inferior peripheral field.    Perception     Praxis      Cognition   Behavior During Therapy: WFL for tasks assessed/performed Overall Cognitive Status: Within Functional Limits for tasks assessed  Extremity/Trunk Assessment               Exercises     Shoulder Instructions       General Comments      Pertinent Vitals/ Pain       Pain Assessment: No/denies pain  Home Living                                           Prior Functioning/Environment              Frequency       Progress Toward Goals  OT Goals(current goals can now be found in the care plan section)  Progress towards OT goals: Progressing toward goals  Acute Rehab OT Goals Patient Stated Goal: to return home OT Goal Formulation: With patient/family Time For Goal Achievement: 10/24/14 Potential to Achieve Goals: Good ADL Goals Additional ADL Goal #2: Pt will complete visual scanning task in room and locate 3 out 3 objects with simple request no cueing  Plan All goals met and education completed, patient discharged from OT services    Co-evaluation                 End of Session Equipment Utilized During Treatment: Gait belt   Activity Tolerance Patient tolerated treatment well   Patient Left in bed;with call bell/phone within reach;with family/visitor present   Nurse Communication Mobility status;Precautions        Time: 5859-2924 OT Time Calculation (min): 21 min  Charges:    Redmond Baseman 10/13/2014, 9:04 AM

## 2014-10-13 NOTE — Discharge Instructions (Signed)
Visual Disturbances °You have had a disturbance in your vision. This may be caused by various conditions, such as: °· Migraines. Migraine headaches are often preceded by a disturbance in vision. Blind spots or light flashes are followed by a headache. This type of visual disturbance is temporary. It does not damage the eye. °· Glaucoma. This is caused by increased pressure in the eye. Symptoms include haziness, blurred vision, or seeing rainbow colored circles when looking at bright lights. Partial or complete visual loss can occur. You may or may not experience eye pain. Visual loss may be gradual or sudden and is irreversible. Glaucoma is the leading cause of blindness. °· Retina problems. Vision will be reduced if the retina becomes detached or if there is a circulation problem as with diabetes, high blood pressure, or a mini-stroke. Symptoms include seeing "floaters," flashes of light, or shadows, as if a curtain has fallen over your eye. °· Optic nerve problems. The main nerve in your eye can be damaged by redness, soreness, and swelling (inflammation), poor circulation, drugs, and toxins. °It is very important to have a complete exam done by a specialist to determine the exact cause of your eye problem. The specialist may recommend medicines or surgery, depending on the cause of the problem. This can help prevent further loss of vision or reduce the risk of having a stroke. Contact the caregiver to whom you have been referred and arrange for follow-up care right away. °SEEK IMMEDIATE MEDICAL CARE IF:  °· Your vision gets worse. °· You develop severe headaches. °· You have any weakness or numbness in the face, arms, or legs. °· You have any trouble speaking or walking. °Document Released: 12/25/2004 Document Revised: 02/09/2012 Document Reviewed: 04/17/2010 °[employer]® Patient Information ©2015 [employer]. This information is not intended to replace advice given to you by your health care provider. Make sure  you discuss any questions you have with your health care provider. ° °

## 2014-10-13 NOTE — Progress Notes (Signed)
Subjective: Pt ready to be discharged home. Vision improved compared to on admission.   Objective: Vital signs in last 24 hours: Filed Vitals:   10/12/14 2118 10/13/14 0130 10/13/14 0554 10/13/14 0929  BP: 128/58 114/63 118/81 122/66  Pulse: 56 64 75 65  Temp: 97.4 F (36.3 C) 97.8 F (36.6 C) 97.5 F (36.4 C) 97.9 F (36.6 C)  TempSrc: Oral Oral Oral Oral  Resp: 18 16 18 20   Height:      Weight:      SpO2: 96% 98% 97% 95%   Weight change:   Intake/Output Summary (Last 24 hours) at 10/13/14 1212 Last data filed at 10/12/14 1237  Gross per 24 hour  Intake    200 ml  Output      0 ml  Net    200 ml    General: resting in bed, NAD HEENT: PERRL, EOMI, visual acquity improved - left Cardiac: RRR, no rubs, murmurs or gallops Pulm: clear to auscultation bilaterally, moving normal volumes of air Abd: soft, nontender, nondistended, BS present Ext: warm and well perfused, no pedal edema Neuro: alert and oriented X3, cranial nerves II-XII grossly intact  Lab Results: Basic Metabolic Panel:  Recent Labs Lab 10/09/14 1717 10/10/14 0834  NA 142 142  K 4.1 4.2  CL 103 103  CO2 26 24  GLUCOSE 96 189*  BUN 17 17  CREATININE 0.86 0.74  CALCIUM 9.2 9.3   Liver Function Tests:  Recent Labs Lab 10/10/14 0834  AST 19  ALT 14  ALKPHOS 74  BILITOT 0.3  PROT 7.0  ALBUMIN 3.7   CBC:  Recent Labs Lab 10/09/14 1717 10/10/14 0834  WBC 5.8 8.4  HGB 14.7 16.1*  HCT 45.1 49.0*  MCV 84.9 86.3  PLT 320 346   CBG:  Recent Labs Lab 10/12/14 0642 10/12/14 1116 10/12/14 1648 10/12/14 1703 10/13/14 0640 10/13/14 1109  GLUCAP 152* 142* 134* 151* 149* 151*   Studies/Results: Dg Chest 2 View  10/11/2014   CLINICAL DATA:  Cough for 2 months.  EXAM: CHEST  2 VIEW  COMPARISON:  August 01, 2013  FINDINGS: The heart size and mediastinal contours are within normal limits. There is no focal infiltrate, pulmonary edema, or pleural effusion. There is mild scar of  lateral left lung base. The lungs are hyperinflated. The visualized skeletal structures are stable.  IMPRESSION: No active cardiopulmonary disease.  Emphysema.   Electronically Signed   By: Abelardo Diesel M.D.   On: 10/11/2014 16:08   Medications: I have reviewed the patient's current medications. Scheduled Meds: . aspirin  81 mg Oral Daily  . fluconazole  400 mg Oral Daily  . heparin  5,000 Units Subcutaneous 3 times per day  . insulin aspart  0-9 Units Subcutaneous TID WC  . pantoprazole  40 mg Oral Daily  . sulfamethoxazole-trimethoprim  1 tablet Oral Daily   Assessment/Plan: 1. Optic neuritis: improved with solumedrol. All other etiology workup has been negative.  - Has received last dose of solumedrol. - Transition to prednisone 60mg  on 10/13/14 - Per neurology notes- No steroids at all, including taper. - Concern for adrenal insufficiency, considering high dose steroids. My attending is okay with a prednsone dose pack.  2. Hyperglycemia in setting of steroids:  Pt has a remote hx of gestational DM and her CBGs 150-170s -cont SSI  Dispo: Disposition is deferred at this time, awaiting improvement of current medical problems.  Anticipated discharge in approximately 1-2 day(s).   The patient does  not have a current PCP (Pcp Not In System) and does need an [facility] hospital follow-up appointment after discharge.  The patient does not have transportation limitations that hinder transportation to clinic appointments.  .Services Needed at time of discharge: Y = Yes, Blank = No PT:   OT:   RN:   Equipment:   Other:     LOS: 4 days   Bethena Roys, MD 10/13/2014, 12:12 PM

## 2014-10-13 NOTE — Progress Notes (Signed)
Subjective: Patient awake and alert.  Vision some improved.  Has completed course of Solumedrol.  Objective: Current vital signs: BP 122/66 mmHg  Pulse 65  Temp(Src) 97.9 F (36.6 C) (Oral)  Resp 20  Ht 5\' 4"  (1.626 m)  Wt 56.291 kg (124 lb 1.6 oz)  BMI 21.29 kg/m2  SpO2 95% Vital signs in last 24 hours: Temp:  [96.3 F (35.7 C)-97.9 F (36.6 C)] 97.9 F (36.6 C) (11/13 0929) Pulse Rate:  [56-91] 65 (11/13 0929) Resp:  [16-20] 20 (11/13 0929) BP: (114-132)/(55-81) 122/66 mmHg (11/13 0929) SpO2:  [95 %-98 %] 95 % (11/13 0929)  Intake/Output from previous day: 11/12 0701 - 11/13 0700 In: 200 [P.O.:200] Out: -  Intake/Output this shift:   Nutritional status: Diet Heart  Neurologic Exam: Mental Status: Alert, oriented, thought content appropriate.  Speech fluent without evidence of aphasia.  Able to follow 3 step commands without difficulty. Cranial Nerves: II: Pupils reactive bilaterally III,IV, VI: ptosis not present, extra-ocular motions intact bilaterally V,VII: smile symmetric, facial light touch sensation normal bilaterally VIII: hearing normal bilaterally IX,X: gag reflex present XI: bilateral shoulder shrug XII: midline tongue extension Motor: Right : Upper extremity   5/5    Left:     Upper extremity   5/5  Lower extremity   5/5     Lower extremity   5/5 Tone and bulk:normal tone throughout; no atrophy noted Sensory: Pinprick and light touch intact throughout, bilaterally Deep Tendon Reflexes: 2+ and symmetric throughout   Lab Results: Basic Metabolic Panel:  Recent Labs Lab 10/09/14 1717 10/10/14 0834  NA 142 142  K 4.1 4.2  CL 103 103  CO2 26 24  GLUCOSE 96 189*  BUN 17 17  CREATININE 0.86 0.74  CALCIUM 9.2 9.3    Liver Function Tests:  Recent Labs Lab 10/10/14 0834  AST 19  ALT 14  ALKPHOS 74  BILITOT 0.3  PROT 7.0  ALBUMIN 3.7   No results for input(s): LIPASE, AMYLASE in the last 168 hours. No results for input(s): AMMONIA in  the last 168 hours.  CBC:  Recent Labs Lab 10/09/14 1717 10/10/14 0834  WBC 5.8 8.4  HGB 14.7 16.1*  HCT 45.1 49.0*  MCV 84.9 86.3  PLT 320 346    Cardiac Enzymes: No results for input(s): CKTOTAL, CKMB, CKMBINDEX, TROPONINI in the last 168 hours.  Lipid Panel: No results for input(s): CHOL, TRIG, HDL, CHOLHDL, VLDL, LDLCALC in the last 168 hours.  CBG:  Recent Labs Lab 10/12/14 1116 10/12/14 1648 10/12/14 1703 10/13/14 0640 10/13/14 1109  GLUCAP 142* 134* 151* 149* 151*    Microbiology: No results found for this or any previous visit.  Coagulation Studies: No results for input(s): LABPROT, INR in the last 72 hours.  Imaging: Dg Chest 2 View  10/11/2014   CLINICAL DATA:  Cough for 2 months.  EXAM: CHEST  2 VIEW  COMPARISON:  August 01, 2013  FINDINGS: The heart size and mediastinal contours are within normal limits. There is no focal infiltrate, pulmonary edema, or pleural effusion. There is mild scar of lateral left lung base. The lungs are hyperinflated. The visualized skeletal structures are stable.  IMPRESSION: No active cardiopulmonary disease.  Emphysema.   Electronically Signed   By: Abelardo Diesel M.D.   On: 10/11/2014 16:08    Medications:  I have reviewed the patient's current medications. Scheduled: . aspirin  81 mg Oral Daily  . fluconazole  400 mg Oral Daily  . heparin  5,000  Units Subcutaneous 3 times per day  . insulin aspart  0-9 Units Subcutaneous TID WC  . pantoprazole  40 mg Oral Daily  . sulfamethoxazole-trimethoprim  1 tablet Oral Daily    Assessment/Plan: Patient has completed course of Solumedrol.  Stable.  To follow up with Ophthalmology on an outpatient basis.     LOS: 4 days   Alexis Goodell, MD [facility] [phone] 10/13/2014  12:08 PM

## 2014-10-13 NOTE — Progress Notes (Signed)
Pt discharged at this time with her husband with all personal belongings. Alert, verbal with no distress. IV discontinued. Discharge education and instructions given with verbal understanding. Pt to pick up prescriptions at local pharmacy. Follow up appts scheduled.

## 2014-10-13 NOTE — Progress Notes (Signed)
Subjective:  The patient slept well overnight and feels much improved from time of admission. Her vision has improved slightly from yesterday, however she still cannot make out clear images. Otherwise, she denies any new conerning symptoms and is enthusiastic about going home.  Objective: Vital signs in last 24 hours: Filed Vitals:   10/12/14 1742 10/12/14 2118 10/13/14 0130 10/13/14 0554  BP: 132/55 128/58 114/63 118/81  Pulse: 91 56 64 75  Temp: 96.3 F (35.7 C) 97.4 F (36.3 C) 97.8 F (36.6 C) 97.5 F (36.4 C)  TempSrc: Oral Oral Oral Oral  Resp: 18 18 16 18   Height:      Weight:      SpO2: 97% 96% 98% 97%   Weight change:   Intake/Output Summary (Last 24 hours) at 10/13/14 0817 Last data filed at 10/12/14 1237  Gross per 24 hour  Intake    200 ml  Output      0 ml  Net    200 ml   General appearance: alert, cooperative, appears stated age and no distress Head: Normocephalic, without obvious abnormality, atraumatic Eyes: conjunctivae/corneas clear. Pupils equal and reactive to light, left eye is reacting normally now. No discomfort with EOMs Back: symmetric, no curvature. ROM normal. No CVA tenderness. Lungs: clear to auscultation bilaterally Heart: regular rate and rhythm, S1, S2 normal, no murmur, click, rub or gallop Abdomen: soft, non-tender; bowel sounds normal; no masses,  no organomegaly Extremities: extremities normal, atraumatic, no cyanosis or edema Neurologic: Mental status: Alert, oriented, thought content appropriate CNII: Visual acuity and visual fields normal in R eye.  L eye acuity is still diminished however improved today and she was able to read some letters. Visual fields also improved, able to distinguish with ease in both eyes. Motor: 5/5 in all extremities Reflexes: 2+ and symmetric Coordination: normal  Lab Results: Results for orders placed or performed during the hospital encounter of 10/09/14 (from the past 24 hour(s))  Glucose,  capillary     Status: Abnormal   Collection Time: 10/12/14 11:16 AM  Result Value Ref Range   Glucose-Capillary 142 (H) 70 - 99 mg/dL  Glucose, capillary     Status: Abnormal   Collection Time: 10/12/14  4:48 PM  Result Value Ref Range   Glucose-Capillary 134 (H) 70 - 99 mg/dL  Glucose, capillary     Status: Abnormal   Collection Time: 10/12/14  5:03 PM  Result Value Ref Range   Glucose-Capillary 151 (H) 70 - 99 mg/dL  Glucose, capillary     Status: Abnormal   Collection Time: 10/13/14  6:40 AM  Result Value Ref Range   Glucose-Capillary 149 (H) 70 - 99 mg/dL   Micro Results: No results found for this or any previous visit (from the past 240 hour(s)). Studies/Results: Dg Chest 2 View  10/11/2014   CLINICAL DATA:  Cough for 2 months.  EXAM: CHEST  2 VIEW  COMPARISON:  August 01, 2013  FINDINGS: The heart size and mediastinal contours are within normal limits. There is no focal infiltrate, pulmonary edema, or pleural effusion. There is mild scar of lateral left lung base. The lungs are hyperinflated. The visualized skeletal structures are stable.  IMPRESSION: No active cardiopulmonary disease.  Emphysema.   Electronically Signed   By: Abelardo Diesel M.D.   On: 10/11/2014 16:08   Medications: I have reviewed the patient's current medications. Scheduled Meds: . aspirin  81 mg Oral Daily  . fluconazole  400 mg Oral Daily  . heparin  5,000 Units Subcutaneous 3 times per day  . insulin aspart  0-9 Units Subcutaneous TID WC  . pantoprazole  40 mg Oral Daily  . sulfamethoxazole-trimethoprim  1 tablet Oral Daily   Continuous Infusions:  PRN Meds:.acetaminophen **OR** acetaminophen, hydroxypropyl methylcellulose / hypromellose, zolpidem Assessment/Plan: Principal Problem:   Optic neuritis Active Problems:   COPD (chronic obstructive pulmonary disease)   Vision loss, left eye  Optic neuritis: Patient with sudden loss of vision in the left eye since Thursday. Patient with an outside MRI  that showing optic nerve hyperintensity consistent with optic neuritis. Patient denies a history of multiple sclerosis. Echo revealed no evidence of cardiac source of thrombus, dopplers showed 1-39% carotid stenosis bilaterally. She was ANA negative, RPR non-reactive, and HIV non-reactive, lyme negative. Other etiologies could include sarcoid, paraneoplastic optic neuritis, neuromyelitis optica, idiopathic isolated optic neuritis, and others as the differential is quite broad in an elderly patient. Given high dosage of steroids she will start antifungal and antibacterial prophylaxis. - Solu-medrol treatment complete  - Fluconazole 400mg  daily for antifungal prophylaxis, d/c at discharge  - TMP-SMX 800-160mg  daily for PCP prophylaxis, d/c at discharge - Spoke with Dr. Katy Fitch over the phone who says that patient can follow-up as an outpatient in 2 weeks. - Follow-up with neurology as an outpatient as 30% of individuals with developmental multiple sclerosis of 5 years.  Hyperglycemia: Patient had a history of gestational diabetes for which she took insulin daily, unfortunately she developed one episode of insulin shock. Has been hyperglycemic with steroid treatment as expected, may need to consider judicious use of insulin. Ranging 150s-180s, has gotten 4u insulin overnight.  - SSI, stop at discharge  COPD: clear to auscultation bilaterally on exam. No acute issues. Etiology believed to be secondary to long time smoking. - Continue home albuterol  Coronary artery disease: Patient reports some kind of procedure that showed 40% occlusion. Patient denies any history of stenting or cardiac surgery. Patient reports having myopathies in response to Crestor. She has not been taking this lately. - Continue home aspirin 81 mg daily - Hold home Crestor 20 mg daily  Diet: heart healthy diet Prophylaxis: heparin subcutaneous Code: full code  Dispo: Disposition is deferred at this time, awaiting  improvement of current medical problems. Anticipated discharge is today.   The patient does not have a current PCP (Pcp Not In System) and does need an [facility] hospital follow-up appointment after discharge.  The patient does not have transportation limitations that hinder transportation to clinic appointments.  This is a Careers information officer Note.  The care of the patient was discussed with Dr. Denton Brick and the assessment and plan formulated with their assistance.  Please see their attached note for official documentation of the daily encounter.   LOS: 4 days   Abran Duke, Med Student 10/13/2014, 8:17 AM

## 2014-10-23 ENCOUNTER — Ambulatory Visit (INDEPENDENT_AMBULATORY_CARE_PROVIDER_SITE_OTHER): Payer: Medicare HMO | Admitting: Internal Medicine

## 2014-10-23 ENCOUNTER — Encounter: Payer: Self-pay | Admitting: Internal Medicine

## 2014-10-23 ENCOUNTER — Other Ambulatory Visit (INDEPENDENT_AMBULATORY_CARE_PROVIDER_SITE_OTHER): Payer: Medicare HMO

## 2014-10-23 VITALS — BP 116/62 | HR 69 | Temp 97.8°F | Resp 14 | Ht 64.0 in | Wt 120.8 lb

## 2014-10-23 DIAGNOSIS — E785 Hyperlipidemia, unspecified: Secondary | ICD-10-CM

## 2014-10-23 DIAGNOSIS — H469 Unspecified optic neuritis: Secondary | ICD-10-CM

## 2014-10-23 DIAGNOSIS — I2510 Atherosclerotic heart disease of native coronary artery without angina pectoris: Secondary | ICD-10-CM

## 2014-10-23 DIAGNOSIS — Z0000 Encounter for general adult medical examination without abnormal findings: Secondary | ICD-10-CM

## 2014-10-23 LAB — LIPID PANEL
CHOLESTEROL: 278 mg/dL — AB (ref 0–200)
HDL: 59.8 mg/dL (ref >39.00)
LDL Cholesterol: 194 mg/dL — ABNORMAL HIGH (ref 0–99)
NonHDL: 218.2
Total CHOL/HDL Ratio: 5
Triglycerides: 122 mg/dL (ref 0.0–149.0)
VLDL: 24.4 mg/dL (ref 0.0–40.0)

## 2014-10-23 NOTE — Progress Notes (Signed)
Pre visit review using our clinic review tool, if applicable. No additional management support is needed unless otherwise documented below in the visit note. 

## 2014-10-23 NOTE — Patient Instructions (Addendum)
We will send you to the neurologist to make sure that there is nothing else we need to do to help prevent any more eye problems.  We will also send you to the cardiologist so that you can establish with them.  The information for the breast Center to call and schedule your mammogram: [address], [place], [place] [address] [phone]  Come back and see [place] in about 6 months so that we can check on your sugars.  We will check your blood work in [place] back with the results. If you have any questions or problems prior to your next visit please feel free to call our office.

## 2014-10-24 DIAGNOSIS — E785 Hyperlipidemia, unspecified: Secondary | ICD-10-CM | POA: Insufficient documentation

## 2014-10-24 LAB — BASIC METABOLIC PANEL
BUN: 18 mg/dL (ref 6–23)
CALCIUM: 8.9 mg/dL (ref 8.4–10.5)
CO2: 27 mEq/L (ref 19–32)
CREATININE: 0.9 mg/dL (ref 0.4–1.2)
Chloride: 103 mEq/L (ref 96–112)
GFR: 65.76 mL/min (ref >60.00)
GLUCOSE: 96 mg/dL (ref 70–99)
Potassium: 4 mEq/L (ref 3.5–5.1)
Sodium: 140 mEq/L (ref 135–145)

## 2014-10-24 NOTE — Assessment & Plan Note (Signed)
Patient is supposed to be taking Crestor however she states that it gives her muscle aches and she does not wish to ever take it again. She has also been tried on another statin without good results. Will check lipid panel today.

## 2014-10-24 NOTE — Assessment & Plan Note (Signed)
Concern exists for MS, temporal arteritis was ruled out in the hospital. Will refer to neurology as she may need follow-up MRI scan.

## 2014-10-24 NOTE — Progress Notes (Signed)
   Subjective:    Patient ID: Wendy Patrick, female    DOB: December 10, 1945, 68 y.o.   MRN: [id number]  HPI The patient is a 68 year old female comes in today to establish care. She is also here for hospital follow-up. She was recently in the hospital with optic neuritis per [facility] lost sight in her left eye, status post five-day Solu-Medrol she is now seeing well again in her left eye. She has followed up with her ophthalmologist. She had MRI done as an outpatient prior to her admission which showed inflammation around the optic nerve. She initially states she had an episode like this in her right eye several years ago but then clarifies that that may have been a retinal detachment. She denies any other neurologic symptoms or changes. She's done well since leaving the hospital.  Review of Systems  Constitutional: Negative for fever, activity change, appetite change, fatigue and unexpected weight change.  HENT: Negative.   Respiratory: Negative for cough, chest tightness, shortness of breath and wheezing.   Cardiovascular: Negative for chest pain, palpitations and leg swelling.  Gastrointestinal: Negative for nausea, abdominal pain, diarrhea, constipation and abdominal distention.  Musculoskeletal: Negative.   Skin: Negative.   Neurological: Negative.       Objective:   Physical Exam  Constitutional: She is oriented to person, place, and time. She appears well-developed and well-nourished.  HENT:  Head: Normocephalic and atraumatic.  Eyes: EOM are normal. Pupils are equal, round, and reactive to light. Right eye exhibits no discharge. Left eye exhibits no discharge.  Neck: Normal range of motion. Neck supple.  Cardiovascular: Normal rate and regular rhythm.   No murmur heard. Pulmonary/Chest: Effort normal and breath sounds normal. No respiratory distress. She has no wheezes. She has no rales.  Abdominal: Soft. Bowel sounds are normal. She exhibits no distension. There is no tenderness. There is no  rebound.  Lymphadenopathy:    She has no cervical adenopathy.  Neurological: She is alert and oriented to person, place, and time. Coordination normal.  Skin: Skin is warm and dry.   Filed Vitals:   10/23/14 1345  BP: 116/62  Pulse: 69  Temp: 97.8 F (36.6 C)  TempSrc: Oral  Resp: 14  Height: 5\' 4"  (1.626 m)  Weight: 120 lb 12.8 oz (54.795 kg)  SpO2: 97%      Assessment & Plan:

## 2014-10-30 ENCOUNTER — Encounter: Payer: Self-pay | Admitting: Internal Medicine

## 2014-11-02 ENCOUNTER — Encounter: Payer: Self-pay | Admitting: Internal Medicine

## 2014-11-06 ENCOUNTER — Encounter: Payer: Self-pay | Admitting: Neurology

## 2014-11-06 ENCOUNTER — Ambulatory Visit (INDEPENDENT_AMBULATORY_CARE_PROVIDER_SITE_OTHER): Payer: Medicare HMO | Admitting: Neurology

## 2014-11-06 ENCOUNTER — Telehealth: Payer: Self-pay | Admitting: Neurology

## 2014-11-06 VITALS — BP 116/66 | HR 74 | Temp 97.8°F | Resp 16 | Ht 64.0 in | Wt 121.6 lb

## 2014-11-06 DIAGNOSIS — H469 Unspecified optic neuritis: Secondary | ICD-10-CM

## 2014-11-06 NOTE — Telephone Encounter (Signed)
Pt called wanting to let DR. Tomi Likens know that she would like to go ahead and have a MRI and her spinal tap before the end of the year is possible due to her insurance. C/B [phone]

## 2014-11-06 NOTE — Telephone Encounter (Signed)
MRI of the cervical spine with and without contrast should be performed prior to the lumbar puncture.  For the lumbar puncture, I want to check CSF cell count, protein, glucose, oligoclonal bands, IgG index, gram stain and culture, cytology, Lyme and ACE

## 2014-11-06 NOTE — Patient Instructions (Addendum)
1.  We will check some more blood work. 2.  We will get MRI of cervical spine with and without contrast [facility] 01/10/15 10:45am  3.  Consider spinal tap 4.  Follow up in 3 months.

## 2014-11-06 NOTE — Progress Notes (Signed)
NEUROLOGY CONSULTATION NOTE  Wendy Patrick MRN: [id number] DOB: 05/26/1946  Referring provider: Dr. Doug Sou Primary care provider: Dr. Doug Sou  Reason for consult:  Optic neuritis  HISTORY OF PRESENT ILLNESS: Wendy Patrick is a 68 year old right-handed woman with COPD and CAD who presents for optic neuritis.  Records, labs and MRI personally reviewed.  In early November, she developed fairly sudden onset of left temporal head pain and left extraocular pain.  She thought it was sinus related due to experiencing sinus congestion.  A few days later, she was watching football when she had sudden onset vision loss in the left eye, involving the central part of her field.  She was evaluated by her ophthalmologist, Dr. Katy Fitch, who suspected optic neuritis.  She had an MRI of the brain with and without contrast performed, which showed moderate swelling of the left optic nerve with contrast enhancement.  Non specific white matter hyperintensities were also seen, not enhancing.  She was admitted to the hospital on 10/09/14 for treatment and further evaluation.  ESR was 2 and CRP was <2.  ANA, HIV, RPR and Lyme were negative.  Carotid doppler showed no hemodynamically significant ICA stenosis.  2D echo showed LVEF 60-65% and no cardiac source of emboli.  CXR was unremarkable.  She was treated with IV Solumedrol $RemoveBefor'150mg'cZpieczGOTRV$  every 6 hours for 12 doses.  Since discharge, she has been doing well.  Her vision has returned.  She denies headache and eye pain.  She feels some diffuse weakness and feels a little unsteady on her feet at times.  She denies prior history of vision loss, except for some vision loss in her right eye related to cataract and epiretinal membrane surgery.  There is no family history of MS.  PAST MEDICAL HISTORY: Past Medical History  Diagnosis Date  . COPD (chronic obstructive pulmonary disease)   . Hyperlipidemia     PAST SURGICAL HISTORY: Past Surgical History  Procedure Laterality Date  .  Eye surgery      MEDICATIONS: Current Outpatient Prescriptions on File Prior to Visit  Medication Sig Dispense Refill  . albuterol (PROVENTIL HFA;VENTOLIN HFA) 108 (90 BASE) MCG/ACT inhaler Inhale 2 puffs into the lungs 4 (four) times daily. 1 Inhaler 0  . aspirin 81 MG tablet Take 81 mg by mouth daily.    . rosuvastatin (CRESTOR) 20 MG tablet Take 20 mg by mouth daily.     No current facility-administered medications on file prior to visit.    ALLERGIES: Allergies  Allergen Reactions  . Crestor [Rosuvastatin] Other (See Comments)    Leg cramps  . Latex Rash    FAMILY HISTORY: Family History  Problem Relation Age of Onset  . Cancer Mother     Breast   . Stroke Brother     open heart surgery     SOCIAL HISTORY: History   Social History  . Marital Status: Married    Spouse Name: N/A    Number of Children: N/A  . Years of Education: N/A   Occupational History  . Not on file.   Social History Main Topics  . Smoking status: Former Smoker    Quit date: 02/05/2000  . Smokeless tobacco: Not on file  . Alcohol Use: 0.0 oz/week    0 Not specified per week     Comment: wine with dinner   . Drug Use: No  . Sexual Activity: No   Other Topics Concern  . Not on file   Social History Narrative  REVIEW OF SYSTEMS: Constitutional: No fevers, chills, or sweats, no generalized fatigue, change in appetite Eyes: No visual changes, double vision, eye pain Ear, nose and throat: No hearing loss, ear pain, nasal congestion, sore throat Cardiovascular: No chest pain, palpitations Respiratory:  No shortness of breath at rest or with exertion, wheezes GastrointestinaI: No nausea, vomiting, diarrhea, abdominal pain, fecal incontinence Genitourinary:  No dysuria, urinary retention or frequency Musculoskeletal:  No neck pain, back pain Integumentary: No rash, pruritus, skin lesions Neurological: as above Psychiatric: No depression, insomnia, anxiety Endocrine: No palpitations,  fatigue, diaphoresis, mood swings, change in appetite, change in weight, increased thirst Hematologic/Lymphatic:  No anemia, purpura, petechiae. Allergic/Immunologic: no itchy/runny eyes, nasal congestion, recent allergic reactions, rashes  PHYSICAL EXAM: Filed Vitals:   11/06/14 0938  BP: 116/66  Pulse: 74  Temp: 97.8 F (36.6 C)  Resp: 16   General: No acute distress Head:  Normocephalic/atraumatic Eyes:  fundi unremarkable, without vessel changes, exudates, hemorrhages or papilledema.  Visual acuity 20/30 left and 20/25 right Neck: supple, no paraspinal tenderness, full range of motion Back: No paraspinal tenderness Heart: regular rate and rhythm Lungs: Clear to auscultation bilaterally. Vascular: No carotid bruits. Neurological Exam: Mental status: alert and oriented to person, place, and time, recent and remote memory intact, fund of knowledge intact, attention and concentration intact, speech fluent and not dysarthric, language intact. Cranial nerves: CN I: not tested CN II: pupils equal, round and reactive to light, visual fields intact, fundi unremarkable, without vessel changes, exudates, hemorrhages or papilledema. CN III, IV, VI:  full range of motion, no nystagmus, no ptosis CN V: facial sensation intact CN VII: upper and lower face symmetric CN VIII: hearing intact CN IX, X: gag intact, uvula midline CN XI: sternocleidomastoid and trapezius muscles intact CN XII: tongue midline Bulk & Tone: normal, no fasciculations. Motor:  5/5 throughout Sensation:  Temperature and vibration intact Deep Tendon Reflexes:  2+ throughout, toes downgoing Finger to nose testing:  No dysmetria Heel to shin:  No dysmetria Gait:  Overall normal station and stride.  She seems to veer a little either direction at times.  Able to turn and walk in tandem. Romberg negative.  IMPRESSION: Left optic neuritis.  Atypical age for onset of MS or idiopathic optic neuritis.  PLAN: 1.  Check  serum ACE level 2.  Check MRI of the cervical spine with and without contrast. 3.  I would have liked to get an LP to evaluate the CSF, for complete workup.  She does not wish to pursue this at this time, so we will monitor for now. 4.  Follow up in 3 months.  Thank you for allowing me to take part in the care of this patient.  Metta Clines, DO  CC:  Vertell Novak, MD

## 2014-11-06 NOTE — Telephone Encounter (Signed)
See below note and advise what you want done on the LP I will reorder the MRI that was scheduled for Feb .

## 2014-11-07 LAB — ANGIOTENSIN CONVERTING ENZYME: ANGIOTENSIN-CONVERTING ENZYME: 51 U/L (ref 8–52)

## 2014-11-17 ENCOUNTER — Other Ambulatory Visit: Payer: Self-pay | Admitting: Neurology

## 2014-11-17 DIAGNOSIS — H469 Unspecified optic neuritis: Secondary | ICD-10-CM

## 2014-11-20 ENCOUNTER — Ambulatory Visit (HOSPITAL_COMMUNITY): Admission: RE | Admit: 2014-11-20 | Payer: Medicare HMO | Source: Ambulatory Visit

## 2014-11-20 ENCOUNTER — Ambulatory Visit (HOSPITAL_COMMUNITY)
Admission: RE | Admit: 2014-11-20 | Discharge: 2014-11-20 | Disposition: A | Discharge status: Home / Self Care | Payer: Medicare HMO | Source: Ambulatory Visit | Attending: Neurology | Admitting: Neurology

## 2014-11-20 DIAGNOSIS — H469 Unspecified optic neuritis: Secondary | ICD-10-CM | POA: Diagnosis present

## 2014-11-20 MED ORDER — GADOBENATE DIMEGLUMINE 529 MG/ML IV SOLN
10.0000 mL | Freq: Once | INTRAVENOUS | Status: AC | PRN
  Administered 2014-11-20: 10 mL via INTRAVENOUS

## 2014-11-21 ENCOUNTER — Other Ambulatory Visit: Payer: Self-pay | Admitting: *Deleted

## 2014-11-21 ENCOUNTER — Telehealth: Payer: Self-pay | Admitting: *Deleted

## 2014-11-21 DIAGNOSIS — H469 Unspecified optic neuritis: Secondary | ICD-10-CM

## 2014-11-21 NOTE — Telephone Encounter (Signed)
Patient is aware of normal MRI  and  LP has been set up for patient  to be done the week of 11/29/14

## 2014-11-21 NOTE — Telephone Encounter (Signed)
-----   Message from Dudley Major, DO sent at 11/21/2014 10:55 AM EST ----- Yes.  Check cell count, protein, glucose, gram stain and culture, IgG index, oligoclonal bands. ----- Message -----    From: Jodell Cipro Der Glas, LPN    Sent: 45/01/8881   9:52 AM      To: Dudley Major, DO  Patient is asking if you are still going to do  Spinal tap ? Please advise   ----- Message -----    From: Dudley Major, DO    Sent: 11/21/2014   6:30 AM      To: Jodell Cipro Der Ruthell Rummage, LPN  MRI of cervical spine looks okay ----- Message -----    From: Rad Results In Interface    Sent: 11/20/2014   3:38 PM      To: Dudley Major, DO

## 2014-12-04 ENCOUNTER — Other Ambulatory Visit (HOSPITAL_COMMUNITY)
Admission: RE | Admit: 2014-12-04 | Discharge: 2014-12-04 | Disposition: A | Discharge status: Home / Self Care | Payer: Medicare HMO | Source: Ambulatory Visit | Attending: Neurology | Admitting: Neurology

## 2014-12-04 ENCOUNTER — Other Ambulatory Visit: Payer: Self-pay | Admitting: Neurology

## 2014-12-04 ENCOUNTER — Ambulatory Visit
Admission: RE | Admit: 2014-12-04 | Discharge: 2014-12-04 | Disposition: A | Discharge status: Home / Self Care | Payer: Medicare HMO | Source: Ambulatory Visit | Attending: Neurology | Admitting: Neurology

## 2014-12-04 DIAGNOSIS — R51 Headache: Secondary | ICD-10-CM | POA: Diagnosis present

## 2014-12-04 DIAGNOSIS — H469 Unspecified optic neuritis: Secondary | ICD-10-CM | POA: Insufficient documentation

## 2014-12-04 LAB — GLUCOSE, CSF: Glucose, CSF: 60 mg/dL (ref 43–76)

## 2014-12-04 LAB — CSF CELL COUNT WITH DIFFERENTIAL
RBC Count, CSF: 0 cu mm
Tube #: 3
WBC CSF: 0 uL (ref 0–5)

## 2014-12-04 LAB — PROTEIN, CSF: TOTAL PROTEIN, CSF: 48 mg/dL — AB (ref 15–45)

## 2014-12-04 NOTE — Progress Notes (Signed)
Blood drawn to be sent with spinal fluid. 2 SST tubes taken from left AC. Site is unremarkable and pt tolerated procedure well. Discharge instructions explained to pt.

## 2014-12-04 NOTE — Discharge Instructions (Signed)

## 2014-12-06 ENCOUNTER — Telehealth: Payer: Self-pay | Admitting: Neurology

## 2014-12-06 ENCOUNTER — Ambulatory Visit (INDEPENDENT_AMBULATORY_CARE_PROVIDER_SITE_OTHER): Payer: Medicare HMO | Admitting: Cardiology

## 2014-12-06 ENCOUNTER — Encounter: Payer: Self-pay | Admitting: Cardiology

## 2014-12-06 ENCOUNTER — Other Ambulatory Visit: Payer: Self-pay | Admitting: *Deleted

## 2014-12-06 VITALS — BP 120/70 | HR 92 | Ht 64.0 in | Wt 121.0 lb

## 2014-12-06 DIAGNOSIS — J439 Emphysema, unspecified: Secondary | ICD-10-CM

## 2014-12-06 DIAGNOSIS — E785 Hyperlipidemia, unspecified: Secondary | ICD-10-CM

## 2014-12-06 DIAGNOSIS — I2510 Atherosclerotic heart disease of native coronary artery without angina pectoris: Secondary | ICD-10-CM | POA: Insufficient documentation

## 2014-12-06 DIAGNOSIS — H469 Unspecified optic neuritis: Secondary | ICD-10-CM

## 2014-12-06 NOTE — Patient Instructions (Signed)
You have been referred to [facility].  Your physician wants you to follow-up in: 1 year with Dr. Radford Pax. You will receive a reminder letter in the mail two months in advance. If you don't receive a letter, please call our office to schedule the follow-up appointment.

## 2014-12-06 NOTE — Telephone Encounter (Signed)
Patient had LP on  12/04/14 she states she started having headache  On 12/05/14 and today when she got out of bed headache occurred with nausea please advise

## 2014-12-06 NOTE — Telephone Encounter (Signed)
If it is better when laying flat, it is likely related to the LP.  I would schedule for blood patch.  Still have not received all the labs from the spinal fluid yet, but so far okay.

## 2014-12-06 NOTE — Progress Notes (Addendum)
[address], [place], [place]  [address] Phone: [phone] Fax:  [phone]  Date:  12/25/2014   ID:  Wendy, Patrick June 24, 1946, MRN [id number]  PCP:  Olga Millers, MD  Cardiologist:  Fransico Him, MD    History of Present Illness: Wendy Patrick is a 69 y.o. female with a history of COPD and dyslipidemia who presents today for evaluation of CAD.  She has a history of ASCAD with 40% RCA by cath 5 years ago otherwise fine.  She has not had any followup recently and moved to [facility] and is here to establish a Cardiologist.  She has not tolerated statin drugs including pravastatin/lipitor/simvastatin and crestor due to muscle aches.  She denies any chest pain or pressure, SOB, DOE (except with extreme exertion from her COPD), LE edema, dizziness, palpitations or syncope.  She had been walking on her treadmill daily until she got optic neuritis and just underwent LP and results are pending.     Wt Readings from Last 3 Encounters:  12/19/14 121 lb 9.6 oz (55.157 kg)  12/06/14 121 lb (54.885 kg)  11/06/14 121 lb 9.6 oz (55.157 kg)     Past Medical History  Diagnosis Date  . COPD (chronic obstructive pulmonary disease)   . Hyperlipidemia   . Endometriosis     1986  . Diabetes mellitus without complication   . Gestational diabetes     age 13  . Optic neuritis     2015  . Coronary artery disease 08/2009    40% mid left circ by cath in [place], [place]    Current Outpatient Prescriptions  Medication Sig Dispense Refill  . aspirin 81 MG tablet Take 81 mg by mouth daily.     No current facility-administered medications for this visit.    Allergies:    Allergies  Allergen Reactions  . Crestor [Rosuvastatin] Other (See Comments)    Leg cramps  . Latex Rash    Social History:  The patient  reports that she quit smoking about 14 years ago. She does not have any smokeless tobacco history on file. She reports that she drinks alcohol. She reports that she does not use  illicit drugs.   Family History:  The patient's family history includes Cancer in her mother; Stroke in her brother.   ROS:  Please see the history of present illness.      All other systems reviewed and negative.   PHYSICAL EXAM: VS:  BP 120/70 mmHg  Pulse 92  Ht 5\' 4"  (1.626 m)  Wt 121 lb (54.885 kg)  BMI 20.76 kg/m2 Well nourished, well developed, in no acute distress HEENT: normal Neck: no JVD Cardiac:  normal S1, S2; RRR; no murmur Lungs:  clear to auscultation bilaterally, no wheezing, rhonchi or rales Abd: soft, nontender, no hepatomegaly Ext: no edema Skin: warm and dry Neuro:  CNs 2-12 intact, no focal abnormalities noted  EKG:  NSR with nonspecific ST abnormality, biatrial enlargement  ASSESSMENT AND PLAN:  1. ASCAD - nonobstructive by cath in [place] about 5 years ago.  She has no anginal symptoms.  Continue ASA 2. Dyslipidemia - LDL is very high at 194 - she has tried all statins and is intolerant.  Her LDL goal is <70.  Will refer to lipid clinic 3. COPD - per PCP 4. Optic Neuritis - recent diagnosis w/u in progress  Followup with me in 1 year  Signed, Fransico Him, MD [facility] 12/25/2014 12:59 PM

## 2014-12-06 NOTE — Telephone Encounter (Signed)
Pt states that she is having headaches and a lumbar puncture done 12-05-14 please call [phone]

## 2014-12-06 NOTE — Telephone Encounter (Signed)
Patient states she feels better now no nausea and the headache is  Just about gone  I ask her to call office later in the day and give a update

## 2014-12-07 ENCOUNTER — Ambulatory Visit
Admission: RE | Admit: 2014-12-07 | Discharge: 2014-12-07 | Disposition: A | Discharge status: Home / Self Care | Payer: Medicare HMO | Source: Ambulatory Visit | Attending: Neurology | Admitting: Neurology

## 2014-12-07 DIAGNOSIS — H469 Unspecified optic neuritis: Secondary | ICD-10-CM

## 2014-12-07 LAB — CSF CULTURE
GRAM STAIN: NONE SEEN
Gram Stain: NONE SEEN

## 2014-12-07 LAB — CNS IGG SYNTHESIS RATE, CSF+BLOOD
Albumin, CSF: 23.8 mg/dL (ref 8.0–42.0)
Albumin, Serum(Neph): 3.9 g/dL (ref 3.2–4.6)
IgG Index, CSF: 0.4 (ref <0.66)
IgG, CSF: 1.5 mg/dL (ref 0.8–7.7)
IgG, Serum: 613 mg/dL — ABNORMAL LOW (ref 694–1618)
MS CNS IgG Synthesis Rate: -3.1 mg/24 h (ref <=3.3)

## 2014-12-07 LAB — CSF CULTURE W GRAM STAIN: Organism ID, Bacteria: NO GROWTH

## 2014-12-07 MED ORDER — IOHEXOL 180 MG/ML  SOLN
1.0000 mL | Freq: Once | INTRAMUSCULAR | Status: AC | PRN
  Administered 2014-12-07: 1 mL via EPIDURAL

## 2014-12-07 NOTE — Discharge Instructions (Signed)

## 2014-12-07 NOTE — Progress Notes (Signed)
20cc blood drawn from right [facility] space without difficulty for Epidural Blood Patch; site unremarkable.

## 2014-12-08 ENCOUNTER — Encounter: Payer: Self-pay | Admitting: *Deleted

## 2014-12-08 ENCOUNTER — Telehealth: Payer: Self-pay | Admitting: *Deleted

## 2014-12-08 LAB — ANGIOTENSIN CONVERTING ENZYME, CSF: ACE, CSF: 11 U/L (ref <=15)

## 2014-12-08 NOTE — Telephone Encounter (Signed)
Per Ruby at [facility] MRI c spine w/wo contrast  is approved  #[id number] valid from 11/17/14-02/15/15 no other imaging has been ordered  per Dr Tomi Likens to date

## 2014-12-09 LAB — OLIGOCLONAL BANDS, CSF + SERM

## 2014-12-11 ENCOUNTER — Telehealth: Payer: Self-pay | Admitting: Neurology

## 2014-12-11 NOTE — Telephone Encounter (Signed)
I spoke with Wendy Patrick regarding the results of the lumbar puncture.  There are more than 5 oligoclonal bands seen, which suggests some inflammatory condition.  I would like her evaluated by an [facility] specialist to see if this is likely an isolated optic neuritis or something else which would require other management.

## 2014-12-14 ENCOUNTER — Ambulatory Visit: Payer: Medicare HMO | Admitting: Pharmacist

## 2014-12-14 LAB — B. BURGDORFI ANTIBODIES, CSF: Lyme Ab: NEGATIVE

## 2014-12-15 ENCOUNTER — Telehealth: Payer: Self-pay | Admitting: Neurology

## 2014-12-15 ENCOUNTER — Ambulatory Visit (INDEPENDENT_AMBULATORY_CARE_PROVIDER_SITE_OTHER): Payer: Medicare HMO | Admitting: Pharmacist

## 2014-12-15 DIAGNOSIS — E785 Hyperlipidemia, unspecified: Secondary | ICD-10-CM

## 2014-12-15 NOTE — Telephone Encounter (Signed)
See message from Dr Tomi Likens on 12/11/2014 to refer patient, but no documentation after that as to whether this was sent, whether we are waiting to hear from faciltiy, etc. [name] - Please advise.

## 2014-12-15 NOTE — Patient Instructions (Signed)
Please bring the results of your genetic test back to [facility].  You can leave at the front desk and I will call you once I have looked through it with a plan of action.

## 2014-12-15 NOTE — Telephone Encounter (Signed)
Pt called wanting to f/u on a referral that Dr. Tomi Likens was going to send her to a MS clinic. Please call pt# [phone]

## 2014-12-17 ENCOUNTER — Other Ambulatory Visit: Payer: Self-pay | Admitting: *Deleted

## 2014-12-17 DIAGNOSIS — G35 Multiple sclerosis: Secondary | ICD-10-CM

## 2014-12-17 DIAGNOSIS — H469 Unspecified optic neuritis: Secondary | ICD-10-CM

## 2014-12-18 ENCOUNTER — Telehealth: Payer: Self-pay | Admitting: *Deleted

## 2014-12-18 ENCOUNTER — Telehealth: Payer: Self-pay

## 2014-12-18 NOTE — Telephone Encounter (Signed)
RN from [facility] called to check status of referral for Dr. Felecia Shelling. Scheduled patient for 12/19/14.

## 2014-12-18 NOTE — Telephone Encounter (Signed)
Patient is aware of appt with Dr Kerman Passey on 12/19/14 at 9 am  For second apinion

## 2014-12-18 NOTE — Telephone Encounter (Signed)
Spoke with patient she is having some vertigo no headache also some rt leg pain she has appt with Dr Kerman Passey on 12/19/14 at [facility]

## 2014-12-19 ENCOUNTER — Ambulatory Visit (INDEPENDENT_AMBULATORY_CARE_PROVIDER_SITE_OTHER): Payer: Medicare HMO | Admitting: Neurology

## 2014-12-19 ENCOUNTER — Encounter: Payer: Self-pay | Admitting: Neurology

## 2014-12-19 VITALS — BP 122/84 | HR 76 | Resp 16 | Wt 121.6 lb

## 2014-12-19 DIAGNOSIS — H469 Unspecified optic neuritis: Secondary | ICD-10-CM | POA: Diagnosis not present

## 2014-12-19 DIAGNOSIS — R42 Dizziness and giddiness: Secondary | ICD-10-CM

## 2014-12-19 DIAGNOSIS — R930 Abnormal findings on diagnostic imaging of skull and head, not elsewhere classified: Secondary | ICD-10-CM | POA: Diagnosis not present

## 2014-12-19 NOTE — Patient Instructions (Signed)
Benign Positional Vertigo Vertigo means you feel like you or your surroundings are moving when they are not. Benign positional vertigo is the most common form of vertigo. Benign means that the cause of your condition is not serious. Benign positional vertigo is more common in older adults. CAUSES  Benign positional vertigo is the result of an upset in the labyrinth system. This is an area in the middle ear that helps control your balance. This may be caused by a viral infection, head injury, or repetitive motion. However, often no specific cause is found. SYMPTOMS  Symptoms of benign positional vertigo occur when you move your head or eyes in different directions. Some of the symptoms may include:  Loss of balance and falls.  Vomiting.  Blurred vision.  Dizziness.  Nausea.  Involuntary eye movements (nystagmus). DIAGNOSIS  Benign positional vertigo is usually diagnosed by physical exam. If the specific cause of your benign positional vertigo is unknown, your caregiver may perform imaging tests, such as magnetic resonance imaging (MRI) or computed tomography (CT). TREATMENT  Your caregiver may recommend movements or procedures to correct the benign positional vertigo. Medicines such as meclizine, benzodiazepines, and medicines for nausea may be used to treat your symptoms. In rare cases, if your symptoms are caused by certain conditions that affect the inner ear, you may need surgery. HOME CARE INSTRUCTIONS   Follow your caregiver's instructions.  Move slowly. Do not make sudden body or head movements.  Avoid driving.  Avoid operating heavy machinery.  Avoid performing any tasks that would be dangerous to you or others during a vertigo episode.  Drink enough fluids to keep your urine clear or pale yellow. SEEK IMMEDIATE MEDICAL CARE IF:   You develop problems with walking, weakness, numbness, or using your arms, hands, or legs.  You have difficulty speaking.  You develop  severe headaches.  Your nausea or vomiting continues or gets worse.  You develop visual changes.  Your family or friends notice any behavioral changes.  Your condition gets worse.  You have a fever.  You develop a stiff neck or sensitivity to light. MAKE SURE YOU:   Understand these instructions.  Will watch your condition.  Will get help right away if you are not doing well or get worse. Document Released: 08/25/2006 Document Revised: 02/09/2012 Document Reviewed: 08/07/2011 Exit[employer] Patient Information 2015 [employer]. This information is not intended to replace advice given to you by your health care provider. Make sure you discuss any questions you have with your health care provider.    

## 2014-12-19 NOTE — Progress Notes (Addendum)
[facility]  PATIENT: Wendy Patrick DOB: [birth year]  REFERRING CLINICIAN:  Dr. Tomi Likens [facility]. Her PCP is Dr. Doug Sou.   Ophth is Dr. Leanne Lovely HISTORY FROM: patient    REASON FOR VISIT: optic neuritis                                 HISTORICAL  CHIEF COMPLAINT:  Chief Complaint  Patient presents with  . Eye Problem    Dx. with optic neuritis left eye 1-2 mos. ago (Opthalmologist Dr. Clydie Braun).  Had an mri brain at [facility] and sts. was told 2 lesions were present.  She was referred to neurologist Dr. Tomi Likens and had an lp 2 weeks ago--sts was told high levels of protein were present in csf and referred her to Dr. Felecia Shelling for r/o MS.  Some blurry vision last night but sts. vision is clear today.  She c/o ranom intermittent, sudden, short lived pains.  She c/o tingling sensation bilat lower legs./fim    HISTORY OF PRESENT ILLNESS:   I had the pleasure of seeing your patient, Wendy Patrick, at [facility] for a neurologic consultation and second opinion regarding her left optic neuritis. As you know, she is a 69 year old woman who had the onset of please vision and eye pain on the left month ago. The symptoms appear to come on over a period of several minutes. The next morning she sore her ophthalmologist, Dr. Clydie Braun, who diagnosed optic neuritis.   She had an MRI of the brain performed unfortunately don't have access to it today. However, she was told that there were 2 spots present.  She was admitted to [facility] for several days and she received IV steroids.    She began to feel better by the end of the infusion and her vision was practically back to normal a few days after she was discharged home.  She was then referred to Dr. Tomi Likens off of our neurology.    He ordered an MRI of the cervical spine . I reviewed those images and there is no evidence of demyelination. She does have minimal degenerative changes in the mid cervical  spine. She also had a lumbar puncture 2 weeks ago. I reviewed the results of that study.  Protein is borderline increased. She did have 5 oligoclonal bands.    However these were also present in the cervical. The report mentioned that a couple of the basilar more prominent in the CNS.  IgG index was normal and the intrathecal IgG synthesis rate was normal. She has had some issues with an intraocular problem in the right eye she also has had cataract surgery on the right  She has noted that over the last 4 months she has had a translation of vertigo where she feels like she is being pushed a little bit to the one side. She notes this most when she is dancing and after a spin feels like she is being pushed one way. However, she has also  noted this while walking down a hallway.  Spinning sensation has bothered her more in the past couple months.   She notes that it will sometimes be worse if she lays a certain way in bed. Leaning over and getting up or rolling over in bed does not always cause her to have vertigo, but sometimes well.   She also notes some tinnitus.   She denies  any episodes of numbness or weakness.   However,  She has noted some tingling in both legs, below the knees with a tight sensation.     This comes and goes.  She does note some urinary dysfunction. Specifically she has problems with urinary hesitancy and she does not think that she completely empties. She notes that she may start and stop several times and that emptying her bladder may take 5 minutes or so. She saw a urologist. She had a cystoscopy and what sounds like urodynamic testing. She was told she had an overactive bladder and was placed on Myrbetriq.  In the past, she may of had a kidney stone.  She notes that for the past 2 weeks she has felt much more fatigued. She denies any depression or anxiety. Falls asleep easily. However, she wakes up twice most nights due to her bladder.     REVIEW OF SYSTEMS:  Constitutional: No  fevers, chills, sweats, or change in appetite.   Decreased energy is noted Eyes: No visual changes, double vision, eye pain Ear, nose and throat: No hearing loss, ear pain, nasal congestion, sore throat.   She has a spinning sensation and tinnitus.   Cardiovascular: No chest pain, palpitations Respiratory:  No shortness of breath at rest or with exertion.   No wheezes GastrointestinaI: No nausea, vomiting, diarrhea, abdominal pain, fecal incontinence Genitourinary:  as above. Musculoskeletal:  No neck pain, back pain Integumentary: No rash, pruritus, skin lesions Neurological: as above Psychiatric: No depression at this time.  No anxiety Endocrine: No palpitations, diaphoresis, change in appetite, change in weigh or increased thirst Hematologic/Lymphatic:  No anemia, purpura, petechiae. Allergic/Immunologic: No itchy/runny eyes, nasal congestion, recent allergic reactions, rashes  ALLERGIES: Allergies  Allergen Reactions  . Crestor [Rosuvastatin] Other (See Comments)    Leg cramps  . Latex Rash    HOME MEDICATIONS: Outpatient Prescriptions Prior to Visit  Medication Sig Dispense Refill  . aspirin 81 MG tablet Take 81 mg by mouth daily.     No facility-administered medications prior to visit.    PAST MEDICAL HISTORY: Past Medical History  Diagnosis Date  . COPD (chronic obstructive pulmonary disease)   . Hyperlipidemia   . Endometriosis     1986  . Coronary artery disease   . Diabetes mellitus without complication   . Gestational diabetes     age 34  . Optic neuritis     2015    PAST SURGICAL HISTORY: Past Surgical History  Procedure Laterality Date  . Eye surgery    . Cardiac catheterization    . Tubal ligation    . Minor hemorrhoidectomy    . Abdominal hysterectomy      FAMILY HISTORY: Family History  Problem Relation Age of Onset  . Cancer Mother     Breast   . Stroke Brother     open heart surgery     SOCIAL HISTORY:  History   Social History    . Marital Status: Married    Spouse Name: N/A    Number of Children: N/A  . Years of Education: N/A   Occupational History  . Not on file.   Social History Main Topics  . Smoking status: Former Smoker    Quit date: 02/05/2000  . Smokeless tobacco: Not on file  . Alcohol Use: 0.0 oz/week    0 Not specified per week     Comment: wine with dinner   . Drug Use: No  . Sexual Activity: No  Other Topics Concern  . Not on file   Social History Narrative     PHYSICAL EXAM  Filed Vitals:   12/19/14 0929  BP: 122/84  Pulse: 76  Resp: 16  Weight: 121 lb 9.6 oz (55.157 kg)    Body mass index is 20.86 kg/(m^2).   General: The patient is well-developed and well-nourished and in no acute distress  Eyes:  Funduscopic exam shows normal optic discs and retinal vessels.  Ears:   TMI.    Neck: The neck is supple, no carotid bruits are noted.  The neck is nontender.  Respiratory: The respiratory examination is clear.  Cardiovascular: The cardiovascular examination reveals a regular rate and rhythm, no murmurs, gallops or rubs are noted.  Skin: Extremities are without significant edema.  Neurologic Exam  Mental status: The patient is alert and oriented x 3 at the time of the examination. The patient has apparent normal recent and remote memory, with an apparently normal attention span and concentration ability.   Speech is normal.  Cranial nerves: Extraocular movements are full. Pupils are equal, round, and reactive to light and accomodation.  Visual fields are full.  Color vision is desaturated out of the left eye.   VA was 20/50 OS and 20/100 OD (actually worse on right),  Facial symmetry is present. There is good facial sensation to soft touch bilaterally.Facial strength is normal.  Trapezius and sternocleidomastoid strength is normal. No dysarthria is noted.  The tongue is midline, and the patient has symmetric elevation of the soft palate. No obvious hearing deficits are  noted.  Motor:  Muscle bulk and tone are normal. Strength is  5 / 5 in all 4 extremities.   Sensory: Sensory testing is intact to pinprick, soft touch, vibration sensation, and position sense in arms but she reports mild decreased touch sensation in the right lower leg.  Coordination: Cerebellar testing reveals good finger-nose-finger and heel-to-shin bilaterally.  Gait and station: Station and gait are normal. Tandem gait is normal. Romberg is negative.   She does not move or spin when marching in place with eyes closed  Reflexes: Deep tendon reflexes are symmetric and normal bilaterally. Plantar responses are normal.  Maneuvers:  Barany testing did not cause any vertical or rotatory nystagmus.    DIAGNOSTIC DATA (LABS, IMAGING, TESTING) - I reviewed patient records, labs, notes, testing and imaging myself where available.       Recent Results (from the past 2160 hour(s))  CBC     Status: Abnormal   Collection Time: 10/09/14  5:17 PM  Result Value Ref Range   WBC 5.8 4.0 - 10.5 K/uL   RBC 5.31 (H) 3.87 - 5.11 MIL/uL   Hemoglobin 14.7 12.0 - 15.0 g/dL   HCT 45.1 36.0 - 46.0 %   MCV 84.9 78.0 - 100.0 fL   MCH 27.7 26.0 - 34.0 pg   MCHC 32.6 30.0 - 36.0 g/dL   RDW 14.7 11.5 - 15.5 %   Platelets 320 150 - 400 K/uL  Basic metabolic panel     Status: Abnormal   Collection Time: 10/09/14  5:17 PM  Result Value Ref Range   Sodium 142 137 - 147 mEq/L   Potassium 4.1 3.7 - 5.3 mEq/L   Chloride 103 96 - 112 mEq/L   CO2 26 19 - 32 mEq/L   Glucose, Bld 96 70 - 99 mg/dL   BUN 17 6 - 23 mg/dL   Creatinine, Ser 0.86 0.50 - 1.10 mg/dL   Calcium  9.2 8.4 - 10.5 mg/dL   GFR calc non Af Amer 67 (L) >90 mL/min   GFR calc Af Amer 78 (L) >90 mL/min    Comment: (NOTE) The eGFR has been calculated using the CKD EPI equation. This calculation has not been validated in all clinical situations. eGFR's persistently <90 mL/min signify possible Chronic Kidney Disease.    Anion gap 13 5 - 15    Sedimentation rate     Status: None   Collection Time: 10/09/14  7:48 PM  Result Value Ref Range   Sed Rate 2 0 - 22 mm/hr  ANA     Status: None   Collection Time: 10/09/14  7:48 PM  Result Value Ref Range   ANA Ser Ql NEGATIVE NEGATIVE    Comment: Performed at [facility]  B. burgdorfi antibodies     Status: None   Collection Time: 10/09/14  7:48 PM  Result Value Ref Range   B burgdorferi Ab IgG+IgM 0.22 ISR    Comment: (NOTE) Antibody to Borrelia burgdorferi not detected.   ISR = Immune Status Ratio             <0.90         ISR       Negative             0.90 - 1.09   ISR       Equivocal             >=1.10        ISR       Positive Performed at [facility]   RPR     Status: None   Collection Time: 10/09/14  7:48 PM  Result Value Ref Range   RPR Ser Ql NON REAC NON REAC    Comment: Performed at [facility]  HIV antibody     Status: None   Collection Time: 10/09/14  7:48 PM  Result Value Ref Range   HIV 1&2 Ab, 4th Generation NONREACTIVE NONREACTIVE    Comment: (NOTE) A NONREACTIVE HIV Ag/Ab result does not exclude HIV infection since the time frame for seroconversion is variable. If acute HIV infection is suspected, a HIV-1 RNA Qualitative TMA test is recommended. HIV-1/2 Antibody Diff         Not indicated. HIV-1 RNA, Qual TMA           Not indicated. PLEASE NOTE: This information has been disclosed to you from records whose confidentiality may be protected by state law. If your state requires such protection, then the state law prohibits you from making any further disclosure of the information without the specific written consent of the person to whom it pertains, or as otherwise permitted by law. A general authorization for the release of medical or other information is NOT sufficient for this purpose. The performance of this assay has not been clinically validated in patients less than 58 years old. Performed at [facility]    Comprehensive metabolic panel     Status: Abnormal   Collection Time: 10/10/14  8:34 AM  Result Value Ref Range   Sodium 142 137 - 147 mEq/L   Potassium 4.2 3.7 - 5.3 mEq/L   Chloride 103 96 - 112 mEq/L   CO2 24 19 - 32 mEq/L   Glucose, Bld 189 (H) 70 - 99 mg/dL   BUN 17 6 - 23 mg/dL   Creatinine, Ser 0.74 0.50 - 1.10 mg/dL   Calcium 9.3 8.4 - 10.5 mg/dL  Total Protein 7.0 6.0 - 8.3 g/dL   Albumin 3.7 3.5 - 5.2 g/dL   AST 19 0 - 37 U/L   ALT 14 0 - 35 U/L   Alkaline Phosphatase 74 39 - 117 U/L   Total Bilirubin 0.3 0.3 - 1.2 mg/dL   GFR calc non Af Amer 84 (L) >90 mL/min   GFR calc Af Amer >90 >90 mL/min    Comment: (NOTE) The eGFR has been calculated using the CKD EPI equation. This calculation has not been validated in all clinical situations. eGFR's persistently <90 mL/min signify possible Chronic Kidney Disease.    Anion gap 15 5 - 15  CBC     Status: Abnormal   Collection Time: 10/10/14  8:34 AM  Result Value Ref Range   WBC 8.4 4.0 - 10.5 K/uL   RBC 5.68 (H) 3.87 - 5.11 MIL/uL   Hemoglobin 16.1 (H) 12.0 - 15.0 g/dL   HCT 49.0 (H) 36.0 - 46.0 %   MCV 86.3 78.0 - 100.0 fL   MCH 28.3 26.0 - 34.0 pg   MCHC 32.9 30.0 - 36.0 g/dL   RDW 14.4 11.5 - 15.5 %   Platelets 346 150 - 400 K/uL  C-reactive protein     Status: Abnormal   Collection Time: 10/10/14  8:34 AM  Result Value Ref Range   CRP <0.5 (L) <0.60 mg/dL    Comment: Performed at [facility]  Glucose, capillary     Status: Abnormal   Collection Time: 10/10/14  3:18 PM  Result Value Ref Range   Glucose-Capillary 169 (H) 70 - 99 mg/dL  Glucose, capillary     Status: Abnormal   Collection Time: 10/10/14  6:13 PM  Result Value Ref Range   Glucose-Capillary 176 (H) 70 - 99 mg/dL  Glucose, capillary     Status: Abnormal   Collection Time: 10/10/14 11:51 PM  Result Value Ref Range   Glucose-Capillary 182 (H) 70 - 99 mg/dL  Glucose, capillary     Status: Abnormal   Collection Time: 10/11/14  6:34 AM   Result Value Ref Range   Glucose-Capillary 163 (H) 70 - 99 mg/dL  Glucose, capillary     Status: Abnormal   Collection Time: 10/11/14  5:24 PM  Result Value Ref Range   Glucose-Capillary 173 (H) 70 - 99 mg/dL  Glucose, capillary     Status: Abnormal   Collection Time: 10/12/14  6:42 AM  Result Value Ref Range   Glucose-Capillary 152 (H) 70 - 99 mg/dL  Glucose, capillary     Status: Abnormal   Collection Time: 10/12/14 11:16 AM  Result Value Ref Range   Glucose-Capillary 142 (H) 70 - 99 mg/dL  Glucose, capillary     Status: Abnormal   Collection Time: 10/12/14  4:48 PM  Result Value Ref Range   Glucose-Capillary 134 (H) 70 - 99 mg/dL  Glucose, capillary     Status: Abnormal   Collection Time: 10/12/14  5:03 PM  Result Value Ref Range   Glucose-Capillary 151 (H) 70 - 99 mg/dL  Glucose, capillary     Status: Abnormal   Collection Time: 10/13/14  6:40 AM  Result Value Ref Range   Glucose-Capillary 149 (H) 70 - 99 mg/dL  Glucose, capillary     Status: Abnormal   Collection Time: 10/13/14 11:09 AM  Result Value Ref Range   Glucose-Capillary 151 (H) 70 - 99 mg/dL   Comment 1 Notify RN   Basic Metabolic Panel (BMET)  Status: None   Collection Time: 10/23/14  2:34 PM  Result Value Ref Range   Sodium 140 135 - 145 mEq/L   Potassium 4.0 3.5 - 5.1 mEq/L   Chloride 103 96 - 112 mEq/L   CO2 27 19 - 32 mEq/L   Glucose, Bld 96 70 - 99 mg/dL   BUN 18 6 - 23 mg/dL   Creatinine, Ser 0.9 0.4 - 1.2 mg/dL   Calcium 8.9 8.4 - 10.5 mg/dL   GFR 65.76 >60.00 mL/min  Lipid panel     Status: Abnormal   Collection Time: 10/23/14  2:34 PM  Result Value Ref Range   Cholesterol 278 (H) 0 - 200 mg/dL    Comment: ATP III Classification       Desirable:  < 200 mg/dL               Borderline High:  200 - 239 mg/dL          High:  > = 240 mg/dL   Triglycerides 122.0 0.0 - 149.0 mg/dL    Comment: Normal:  <150 mg/dLBorderline High:  150 - 199 mg/dL   HDL 59.80 >39.00 mg/dL   VLDL 24.4 0.0 - 40.0  mg/dL   LDL Cholesterol 194 (H) 0 - 99 mg/dL   Total CHOL/HDL Ratio 5     Comment:                Men          Women1/2 Average Risk     3.4          3.3Average Risk          5.0          4.42X Average Risk          9.6          7.13X Average Risk          15.0          11.0                       NonHDL 218.20     Comment: NOTE:  Non-HDL goal should be 30 mg/dL higher than patient's LDL goal (i.e. LDL goal of < 70 mg/dL, would have non-HDL goal of < 100 mg/dL)  Angiotensin converting enzyme     Status: None   Collection Time: 11/06/14 10:41 AM  Result Value Ref Range   Angiotensin-Converting Enzyme 51 8 - 52 U/L  CSF cell count with differential     Status: None   Collection Time: 12/04/14  2:01 PM  Result Value Ref Range   Tube # 3    Color, CSF COLORLESS COLORLESS   Appearance, CSF CLEAR CLEAR   WBC, CSF 0 0 - 5 cu mm   RBC Count, CSF 0 0 cu mm   Segmented Neutrophils-CSF NOT PERFORMED 0 - 6 %   Lymphs, CSF NOT PERFORMED 40 - 80 %   Monocyte/Macrophage NOT PERFORMED 15 - 45 %   Eosinophils, CSF NOT PERFORMED 0 - 1 %   Other Cells, CSF SEE NOTE     Comment: Too few to count, smear available for review Rare lymph and mono noted on smear.   Glucose, CSF     Status: None   Collection Time: 12/04/14  2:01 PM  Result Value Ref Range   Glucose, CSF 60 43 - 76 mg/dL  Protein, CSF     Status: Abnormal   Collection Time: 12/04/14  2:01 PM  Result Value Ref Range   Total Protein, CSF 48 (H) 15 - 45 mg/dL  CSF culture     Status: None   Collection Time: 12/04/14  2:01 PM  Result Value Ref Range   Gram Stain CYTOSPIN    Gram Stain No WBC Seen    Gram Stain No Organisms Seen    Organism ID, Bacteria NO GROWTH 3 DAYS   Angiotensin converting enzyme, CSF     Status: None   Collection Time: 12/04/14  2:01 PM  Result Value Ref Range   ACE, CSF 11 <=15 U/L  Oligoclonal bands, CSF + serm     Status: None   Collection Time: 12/04/14  2:01 PM  Result Value Ref Range   CSF Oligoclonal  Bands REPORT     Comment: Bands noted            Reference Range: No bands The patient's CSF contains >5 well defined gamma restriction bands that are also present in the patient's corresponding serum sample, but some bands in the CSF are more prominent.  This pattern is associated with Guillain-Barre's syndrome, peripheral neuropathy or increased permeability of the blood-brain barrier secondary to infection or trauma. Oligoclonal bands are present in the CSF of more than 85% of patients with clinically definite multiple sclerosis (MS). To distinguish between oligoclonal bands in the CSF due to a peripheral gammopathy and oligoclonal bands due to local production in the CNS, serum and CSF should be tested simultaneously. Oligoclonal bands can however be observed in a variety of other diseases, e.g., subacute sclerosing panen- cephalitis, inflammatory polyneuropathy, CNS lupus, and brain tumors and infarctions. The clinical significance of a numerical band count, determined by isoelectric foc using, has not been definitively defined. The data should be interpreted in conjunction with all pertinent clinical and laboratory data for this patient.   B. burgdorfi antibodies, CSF     Status: None   Collection Time: 12/04/14  2:01 PM  Result Value Ref Range   Lyme Ab SCREENED NEGATIVE     Comment: The complete series of assays could not be performed because the sample was negative for Lyme antibodies.    Control Ab Index NOT PERFORMED     Comment: TNP-TEST NOT PERFORMED    TEST NOT REQUIRED    Albumin Ratio NOT PERFORMED     Comment: TNP-TEST NOT PERFORMED    TEST NOT REQUIRED    Interpretation NOT PERFORMED     Comment: TNP-TEST NOT PERFORMED    TEST NOT REQUIRED REFERENCE RANGES:   LYME DISEASE ANTIBODY INDEX:     < or = 1.0 Negative      1.1 - 1.2 Equivocal     > or = 1.3 Positive   CONTROL ANTIBODY INDEX:     < or = 1.0   ALBUMIN RATIO:     <0.0078   The Lyme  Disease Antibody Index is used as an aid for the diagnosis of neuroborreliosis. An increased Lyme Disease Antibody Index (>1.2), accompanied by a Control Antibody Index of less than 1.0 and an Albumin Ratio of less than 0.0078, is strong evidence for intrathecal synthesis of organism-specific antibody, and thus CNS B. burgdorferi infection. Elevation of either the control antibody index, the Albumin Ratio, or both may indicate leakage of antibody across the blood-brain barrier which may falsely elevate the Lyme Disease Antibody Index.   CNS IgG synthesis rate, CSF+blood     Status: Abnormal   Collection Time: 12/04/14  2:01 PM  Result Value Ref Range   MS CNS IgG Synthesis Rate -3.1 -9.9 - 3.3 mg/24 h   IgG Index, CSF 0.40 <0.66   Albumin, CSF 23.8 8.0 - 42.0 mg/dL   IgG, CSF 1.5 0.8 - 7.7 mg/dL   IgG, Serum 613 (L) 694 - 1618 mg/dL   Albumin, Serum(Neph) 3.9 3.2 - 4.6 g/dL       ASSESSMENT AND PLAN   In summary, Wendy Patrick is a 69 year old woman with left optic neuritis that has mostly resolved since receiving several days of steroids. She also has numbness in her right leg and a spinning sensation.   By her report, her MRI showed a couple of white matter foci and I have asked her and her husband to bring the actual MRI images on CD to me so I can review. Optic neuritis does have an association MS. However, given her age idiopathic optic neuritis might be more likely.  The spinal fluid was more normal with an abnormal as the IgG index was normal and the bands noted in the CSF were also noted in the serum I'm not sure what to make of the comment that some of the bands were more intense.The optic neuritis treatment trial found that about 25% of people presenting with unilateral optic neuritis and a fairly normal MRI do not go on to get MS.   However, if she has 3 or more foci on MRI with an appearance consistent with MS, her odds could be 85 or 90%. At her age, a couple small hyperintense  foci and MRI would probably be within the normal limits. Her vertigo appears to be more positional than central  I will ask her to come back to see me in about 5 or 6 months for a follow-up. However, she is advised to call sooner if she has any new or worsening neurologic symptoms. We will recheck an MRI of the brain in 6-9 months. If there is progression in a pattern consistent with MS, then MS would be a more likely diagnosis and we would consider treatment.   Armeda Plumb A. Felecia Shelling, MD, PhD 4/65/0354, 6:56 AM Certified in Neurology, Clinical Neurophysiology, Sleep Medicine, Pain Medicine and Neuroimaging  [facility] [address], [place], [place] [address] [phone]  Addendum:    The images from the MRI of the brain and orbits became available for my review.   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  No atrophy.    No enhancement of any brain foci.  No abnl DWI  MRI of the orbits show enhancement of the left optic nerve.    Left ON is more likely to be idiopathic than be related to MS but chance is still about 25%.   We will re-check another MRI in 6 months or so to determine if there are any changes over time suggestive of demyelination.      --RAS

## 2014-12-25 ENCOUNTER — Encounter: Payer: Self-pay | Admitting: Cardiology

## 2014-12-26 NOTE — Assessment & Plan Note (Signed)
Reviewed pt's labs.  LDL 194 (goal<70).  She is hesitant to start any medications at this time since she is undergoing work up for possible MS.  Pt brought her pharmacogenomic profile into clinic.  She is e3/e4- which means she is somewhat of a hyperabsorber.  These patients typically respond better to Zetia, metamucil, or Benical chewable tablets.  Pt would prefer to try metamucil for now then if that doesn't work, go to [employer].  Also discussed decreasing fat content in food.  Suggested she also add fish oil 1 gm/day to help prevent Alzheimer's  in this population.

## 2014-12-26 NOTE — Progress Notes (Signed)
HPI  Wendy Patrick is a 69 yo F patient of Dr. Theodosia Blender who was referred to [facility] due to multiple statin intolerances.  She has been followed by Dr. Chancy Milroy in [place] but recently moved to [facility] and transferred care to our office.  She had a cath 5 years ago that showed 40% blockage of her RCA.   She recently underwent a lumbar puncture to determine if she has MS.    Family History: 1 brother with a PCI at 22 and another brother with CABG in his 63s and stroke.   Pt reports she has tried Crestor 5mg -20mg  daily, Lipitor ans simvastatin all with Dr. Chancy Milroy and had muscle pains with each of them.  She states she had some type of testing done at the facility she lives at and that test showed she was not a good candidate for statins.   Pt follows the [place] diet.  She has cut out all snack foods and lost 20lbs since June.  When she felt better, she liked to walk on the treadmill and dancing.   Current Outpatient Prescriptions  Medication Sig Dispense Refill  . aspirin 81 MG tablet Take 81 mg by mouth daily.     No current facility-administered medications for this visit.   Allergies  Allergen Reactions  . Crestor [Rosuvastatin] Other (See Comments)    Leg cramps  . Latex Rash

## 2015-01-10 ENCOUNTER — Ambulatory Visit (HOSPITAL_COMMUNITY): Payer: Medicare HMO

## 2015-02-05 ENCOUNTER — Ambulatory Visit: Payer: Medicare HMO | Admitting: Neurology

## 2015-02-08 ENCOUNTER — Encounter: Payer: Self-pay | Admitting: Neurology

## 2015-02-08 ENCOUNTER — Ambulatory Visit (INDEPENDENT_AMBULATORY_CARE_PROVIDER_SITE_OTHER): Payer: Medicare HMO | Admitting: Neurology

## 2015-02-08 VITALS — BP 126/64 | HR 70 | Resp 20 | Ht 64.0 in | Wt 125.7 lb

## 2015-02-08 DIAGNOSIS — R930 Abnormal findings on diagnostic imaging of skull and head, not elsewhere classified: Secondary | ICD-10-CM

## 2015-02-08 DIAGNOSIS — H8110 Benign paroxysmal vertigo, unspecified ear: Secondary | ICD-10-CM

## 2015-02-08 DIAGNOSIS — H469 Unspecified optic neuritis: Secondary | ICD-10-CM

## 2015-02-08 DIAGNOSIS — R9082 White matter disease, unspecified: Secondary | ICD-10-CM

## 2015-02-08 NOTE — Progress Notes (Signed)
NEUROLOGY FOLLOW UP OFFICE NOTE  Wendy Patrick [id number]  HISTORY OF PRESENT ILLNESS: Wendy Patrick is a 69 year old right-handed woman with COPD and CAD who follows up for optic neuritis and white matter abnormality of brain MRI.  She is accompanied by her husband who provides some history.  Records reviewed.  UPDATE: MRI of the cervical spine was unremarkable.  She underwent a lumbar puncture.  Results showed protein 48, glucose 60, 5 oligoclonal bands, normal IgG index of 0.40 with normal intrathecal IgG synthesis rate of -3.1, normal ACE level of 11 and negative Lyme antibody index.  She saw Dr. Felecia Shelling, an [place] specialist, in January for an evaluation for MS.  Plan is to re-image in 6 to 9 months to look for any increased hyperintense foci.    For her dizziness, she went to vestibular rehab, which helped.  HISTORY: In early November, she developed fairly sudden onset of left temporal head pain and left extraocular pain.  She thought it was sinus related due to experiencing sinus congestion. A few days later, she was watching football when she had sudden onset vision loss in the left eye, involving the central part of her field.  She was evaluated by her ophthalmologist, Dr. Katy Fitch, who suspected optic neuritis.  She had an MRI of the brain with and without contrast performed, which showed moderate swelling of the left optic nerve with contrast enhancement.  Non specific white matter hyperintensities were also seen, not enhancing.  She was admitted to the hospital on 10/09/14 for treatment and further evaluation.  ESR was 2 and CRP was <2.  ANA, HIV, RPR and Lyme were negative.  Carotid doppler showed no hemodynamically significant ICA stenosis.  2D echo showed LVEF 60-65% and no cardiac source of emboli.  CXR was unremarkable.  She was treated with IV Solumedrol $RemoveBefor'150mg'gMAEHwIBrAeh$  every 6 hours for 12 doses.  Since discharge, she has been doing well.  Her vision has returned.  She denies headache and eye pain.   She feels some diffuse weakness and feels a little unsteady on her feet at times.  She denies prior history of vision loss, except for some vision loss in her right eye related to cataract and epiretinal membrane surgery.  There is no family history of MS.  PAST MEDICAL HISTORY: Past Medical History  Diagnosis Date  . COPD (chronic obstructive pulmonary disease)   . Hyperlipidemia   . Endometriosis     1986  . Diabetes mellitus without complication   . Gestational diabetes     age 16  . Optic neuritis     2015  . Coronary artery disease 08/2009    40% mid left circ by cath in [place], [place]    MEDICATIONS: Current Outpatient Prescriptions on File Prior to Visit  Medication Sig Dispense Refill  . aspirin 81 MG tablet Take 81 mg by mouth daily.     No current facility-administered medications on file prior to visit.    ALLERGIES: Allergies  Allergen Reactions  . Crestor [Rosuvastatin] Other (See Comments)    Leg cramps  . Latex Rash    FAMILY HISTORY: Family History  Problem Relation Age of Onset  . Cancer Mother     Breast   . Stroke Brother     open heart surgery     SOCIAL HISTORY: History   Social History  . Marital Status: Married    Spouse Name: N/A  . Number of Children: N/A  . Years of Education:  N/A   Occupational History  . Not on file.   Social History Main Topics  . Smoking status: Former Smoker    Quit date: 02/05/2000  . Smokeless tobacco: Never Used  . Alcohol Use: 0.0 oz/week    0 Standard drinks or equivalent per week     Comment: wine with dinner   . Drug Use: No  . Sexual Activity: No   Other Topics Concern  . Not on file   Social History Narrative    REVIEW OF SYSTEMS: Constitutional: No fevers, chills, or sweats, no generalized fatigue, change in appetite Eyes: No visual changes, double vision, eye pain Ear, nose and throat: No hearing loss, ear pain, nasal congestion, sore throat Cardiovascular: No chest pain,  palpitations Respiratory:  No shortness of breath at rest or with exertion, wheezes GastrointestinaI: No nausea, vomiting, diarrhea, abdominal pain, fecal incontinence Genitourinary:  No dysuria, urinary retention or frequency Musculoskeletal:  No neck pain, back pain Integumentary: No rash, pruritus, skin lesions Neurological: as above Psychiatric: No depression, insomnia, anxiety Endocrine: No palpitations, fatigue, diaphoresis, mood swings, change in appetite, change in weight, increased thirst Hematologic/Lymphatic:  No anemia, purpura, petechiae. Allergic/Immunologic: no itchy/runny eyes, nasal congestion, recent allergic reactions, rashes  PHYSICAL EXAM: Filed Vitals:   02/08/15 1018  BP: 126/64  Pulse: 70  Resp: 20   General: No acute distress Head:  Normocephalic/atraumatic Eyes:  Fundoscopic exam unremarkable without vessel changes, exudates, hemorrhages or papilledema. Neck: supple, no paraspinal tenderness, full range of motion Heart:  Regular rate and rhythm Lungs:  Clear to auscultation bilaterally Back: No paraspinal tenderness Neurological Exam: alert and oriented to person, place, and time. Attention span and concentration intact, recent and remote memory intact, fund of knowledge intact.  Speech fluent and not dysarthric, language intact.  CN II-XII intact. Fundoscopic exam unremarkable without vessel changes, exudates, hemorrhages or papilledema.  Bulk and tone normal, muscle strength 5/5 throughout.  Sensation to light touch, temperature and vibration intact.  Deep tendon reflexes 2+ throughout.  Finger to nose testing intact.  Gait normal  IMPRESSION: Abnormal white matter on MRI of brain.  May be small vessel disease, but needs monitoring in light of the optic neuritis. Left optic neuritis BPPV, improved.  PLAN: Will follow up with Dr. Felecia Shelling as scheduled.  No follow up with me necessary.  Metta Clines, DO  CC:  Vertell Novak, MD

## 2015-02-08 NOTE — Patient Instructions (Signed)
Follow up with Dr. Felecia Shelling as scheduled.  You don't need to follow up with me.

## 2015-02-14 ENCOUNTER — Telehealth: Payer: Self-pay

## 2015-02-14 NOTE — Telephone Encounter (Signed)
Patient declines flu vaccine this season

## 2015-04-23 ENCOUNTER — Ambulatory Visit: Payer: Medicare HMO | Admitting: Internal Medicine

## 2015-04-26 ENCOUNTER — Ambulatory Visit (INDEPENDENT_AMBULATORY_CARE_PROVIDER_SITE_OTHER): Payer: Medicare HMO | Admitting: Internal Medicine

## 2015-04-26 ENCOUNTER — Encounter: Payer: Self-pay | Admitting: Internal Medicine

## 2015-04-26 VITALS — BP 98/60 | HR 71 | Temp 97.9°F | Resp 12 | Wt 126.0 lb

## 2015-04-26 DIAGNOSIS — H469 Unspecified optic neuritis: Secondary | ICD-10-CM | POA: Diagnosis not present

## 2015-04-26 NOTE — Progress Notes (Signed)
   Subjective:    Patient ID: Wendy Patrick, female    DOB: 07-08-1946, 69 y.o.   MRN: [id number]  HPI The patient is a 69 YO female who is coming in to follow up on her eye symptoms. She had a bout with optic neuritis last fall. She is still following with neurology and is due for CT scan in July to follow up. Her eye symptoms have resolved. Workup was fairly unremarkable. No new complaints.  Review of Systems  Constitutional: Negative for fever, activity change, appetite change, fatigue and unexpected weight change.  HENT: Negative.   Respiratory: Negative for cough, chest tightness, shortness of breath and wheezing.   Cardiovascular: Negative for chest pain, palpitations and leg swelling.  Gastrointestinal: Negative for nausea, abdominal pain, diarrhea, constipation and abdominal distention.  Musculoskeletal: Negative.   Skin: Negative.   Neurological: Negative.       Objective:   Physical Exam  Constitutional: She is oriented to person, place, and time. She appears well-developed and well-nourished.  HENT:  Head: Normocephalic and atraumatic.  Eyes: EOM are normal. Pupils are equal, round, and reactive to light. Right eye exhibits no discharge. Left eye exhibits no discharge.  Neck: Normal range of motion. Neck supple.  Cardiovascular: Normal rate and regular rhythm.   No murmur heard. Pulmonary/Chest: Effort normal and breath sounds normal. No respiratory distress. She has no wheezes. She has no rales.  Abdominal: Soft. Bowel sounds are normal. She exhibits no distension. There is no tenderness. There is no rebound.  Lymphadenopathy:    She has no cervical adenopathy.  Neurological: She is alert and oriented to person, place, and time. Coordination normal.  Skin: Skin is warm and dry.   Filed Vitals:   04/26/15 1453  BP: 98/60  Pulse: 71  Temp: 97.9 F (36.6 C)  TempSrc: Oral  Resp: 12  Weight: 126 lb (57.153 kg)  SpO2: 98%      Assessment & Plan:

## 2015-04-26 NOTE — Assessment & Plan Note (Signed)
Resolved now and 2 lesions on imaging. Due for repeat in July for follow for progression versus regression of lesions.

## 2015-04-26 NOTE — Progress Notes (Signed)
Pre visit review using our clinic review tool, if applicable. No additional management support is needed unless otherwise documented below in the visit note. 

## 2015-05-30 ENCOUNTER — Encounter: Payer: Self-pay | Admitting: Internal Medicine

## 2015-05-30 ENCOUNTER — Ambulatory Visit (INDEPENDENT_AMBULATORY_CARE_PROVIDER_SITE_OTHER): Payer: Medicare HMO | Admitting: Internal Medicine

## 2015-05-30 VITALS — BP 98/60 | HR 71 | Temp 97.7°F | Ht 64.0 in | Wt 127.8 lb

## 2015-05-30 DIAGNOSIS — Z1231 Encounter for screening mammogram for malignant neoplasm of breast: Secondary | ICD-10-CM

## 2015-05-30 DIAGNOSIS — J3089 Other allergic rhinitis: Secondary | ICD-10-CM

## 2015-05-30 DIAGNOSIS — Z0000 Encounter for general adult medical examination without abnormal findings: Secondary | ICD-10-CM

## 2015-05-30 MED ORDER — LEVOCETIRIZINE DIHYDROCHLORIDE 5 MG PO TABS: 5.0000 mg | ORAL_TABLET | Freq: Every evening | ORAL | Status: DC

## 2015-05-30 MED ORDER — BENZONATATE 100 MG PO CAPS: 100.0000 mg | ORAL_CAPSULE | Freq: Two times a day (BID) | ORAL | Status: DC | PRN

## 2015-05-30 NOTE — Patient Instructions (Addendum)
We have sent in some tessalon perles for cough that you can take 2-3 times a day to help with the cough.   Stop taking the loratadine for the allergies and start using xyzal. This is a prescription medicine we have called in to the pharmacy and you take once a day to help stop the drainage and get rid of the cough. It can take 2-3 weeks for the cough to be better after the drainage is gone.   Wendy Patrick , Thank you for taking time to come for your Medicare Wellness Visit. I appreciate your ongoing commitment to your health goals. Please review the following plan we discussed and let me know if I can assist you in the future.   Will fup on mammogram prior to July 31st of this year. Declines Prevnar 13;  These are the goals we discussed: Goals    . Reduce fat intake to X grams per day     Will continue on the [place] diet; continue with home exercise         This is a list of the screening recommended for you and due dates:  Health Maintenance  Topic Date Due  . Colon Cancer Screening  08/30/1994  . DEXA scan (bone density measurement)  08/30/2009  . Mammogram  09/28/2009  . Pneumonia vaccines (2 of 2 - PCV13) 12/01/2010  . Flu Shot  07/02/2015  . Tetanus Vaccine  12/01/2020  . Shingles Vaccine  Completed   Osteoporosis Throughout your life, your body breaks down old bone and replaces it with new bone. As you get older, your body does not replace bone as quickly as it breaks it down. By the age of 53 years, most people begin to gradually lose bone because of the imbalance between bone loss and replacement. Some people lose more bone than others. Bone loss beyond a specified normal degree is considered osteoporosis.  Osteoporosis affects the strength and durability of your bones. The inside of the ends of your bones and your flat bones, like the bones of your pelvis, look like honeycomb, filled with tiny open spaces. As bone loss occurs, your bones become less dense. This means that the  open spaces inside your bones become bigger and the walls between these spaces become thinner. This makes your bones weaker. Bones of a person with osteoporosis can become so weak that they can break (fracture) during minor accidents, such as a simple fall. CAUSES  The following factors have been associated with the development of osteoporosis:  Smoking.  Drinking more than 2 alcoholic drinks several days per week.  Long-term use of certain medicines:  Corticosteroids.  Chemotherapy medicines.  Thyroid medicines.  Antiepileptic medicines.  Gonadal hormone suppression medicine.  Immunosuppression medicine.  Being underweight.  Lack of physical activity.  Lack of exposure to the sun. This can lead to vitamin D deficiency.  Certain medical conditions:  Certain inflammatory bowel diseases, such as Crohn disease and ulcerative colitis.  Diabetes.  Hyperthyroidism.  Hyperparathyroidism. RISK FACTORS Anyone can develop osteoporosis. However, the following factors can increase your risk of developing osteoporosis:  Gender--Women are at higher risk than men.  Age--Being older than 50 years increases your risk.  Ethnicity--White and Asian people have an increased risk.  Weight --Being extremely underweight can increase your risk of osteoporosis.  Family history of osteoporosis--Having a family member who has developed osteoporosis can increase your risk. SYMPTOMS  Usually, people with osteoporosis have no symptoms.  DIAGNOSIS  Signs during a physical  exam that may prompt your caregiver to suspect osteoporosis include:  Decreased height. This is usually caused by the compression of the bones that form your spine (vertebrae) because they have weakened and become fractured.  A curving or rounding of the upper back (kyphosis). To confirm signs of osteoporosis, your caregiver may request a procedure that uses 2 low-dose X-ray beams with different levels of energy to measure  your bone mineral density (dual-energy X-ray absorptiometry [DXA]). Also, your caregiver may check your level of vitamin D. TREATMENT  The goal of osteoporosis treatment is to strengthen bones in order to decrease the risk of bone fractures. There are different types of medicines available to help achieve this goal. Some of these medicines work by slowing the processes of bone loss. Some medicines work by increasing bone density. Treatment also involves making sure that your levels of calcium and vitamin D are adequate. PREVENTION  There are things you can do to help prevent osteoporosis. Adequate intake of calcium and vitamin D can help you achieve optimal bone mineral density. Regular exercise can also help, especially resistance and weight-bearing activities. If you smoke, quitting smoking is an important part of osteoporosis prevention. MAKE SURE YOU:  Understand these instructions.  Will watch your condition.  Will get help right away if you are not doing well or get worse. FOR MORE INFORMATION www.osteo.org and EquipmentWeekly.com.ee Document Released: 08/27/2005 Document Revised: 03/14/2013 Document Reviewed: 11/01/2011 [facility] Patient Information 2015 [place], [place]. This information is not intended to replace advice given to you by your health care provider. Make sure you discuss any questions you have with your health care provider.   Health Maintenance Adopting a healthy lifestyle and getting preventive care can go a long way to promote health and wellness. Talk with your health care provider about what schedule of regular examinations is right for you. This is a good chance for you to check in with your provider about disease prevention and staying healthy. In between checkups, there are plenty of things you can do on your own. Experts have done a lot of research about which lifestyle changes and preventive measures are most likely to keep you healthy. Ask your health care provider for more  information. WEIGHT AND DIET  Eat a healthy diet  Be sure to include plenty of vegetables, fruits, low-fat dairy products, and lean protein.  Do not eat a lot of foods high in solid fats, added sugars, or salt.  Get regular exercise. This is one of the most important things you can do for your health.  Most adults should exercise for at least 150 minutes each week. The exercise should increase your heart rate and make you sweat (moderate-intensity exercise).  Most adults should also do strengthening exercises at least twice a week. This is in addition to the moderate-intensity exercise.  Maintain a healthy weight  Body mass index (BMI) is a measurement that can be used to identify possible weight problems. It estimates body fat based on height and weight. Your health care provider can help determine your BMI and help you achieve or maintain a healthy weight.  For females 54 years of age and older:   A BMI below 18.5 is considered underweight.  A BMI of 18.5 to 24.9 is normal.  A BMI of 25 to 29.9 is considered overweight.  A BMI of 30 and above is considered obese.  Watch levels of cholesterol and blood lipids  You should start having your blood tested for lipids and cholesterol  at 71 years of age, then have this test every 5 years.  You may need to have your cholesterol levels checked more often if:  Your lipid or cholesterol levels are high.  You are older than 71 years of age.  You are at high risk for heart disease.  CANCER SCREENING   Lung Cancer  Lung cancer screening is recommended for adults 37-30 years old who are at high risk for lung cancer because of a history of smoking.  A yearly low-dose CT scan of the lungs is recommended for people who:  Currently smoke.  Have quit within the past 15 years.  Have at least a 30-pack-year history of smoking. A pack year is smoking an average of one pack of cigarettes a day for 1 year.  Yearly screening should  continue until it has been 15 years since you quit.  Yearly screening should stop if you develop a health problem that would prevent you from having lung cancer treatment.  Breast Cancer  Practice breast self-awareness. This means understanding how your breasts normally appear and feel.  It also means doing regular breast self-exams. Let your health care provider know about any changes, no matter how small.  If you are in your 20s or 30s, you should have a clinical breast exam (CBE) by a health care provider every 1-3 years as part of a regular health exam.  If you are 50 or older, have a CBE every year. Also consider having a breast X-ray (mammogram) every year.  If you have a family history of breast cancer, talk to your health care provider about genetic screening.  If you are at high risk for breast cancer, talk to your health care provider about having an MRI and a mammogram every year.  Breast cancer gene (BRCA) assessment is recommended for women who have family members with BRCA-related cancers. BRCA-related cancers include:  Breast.  Ovarian.  Tubal.  Peritoneal cancers.  Results of the assessment will determine the need for genetic counseling and BRCA1 and BRCA2 testing. Cervical Cancer Routine pelvic examinations to screen for cervical cancer are no longer recommended for nonpregnant women who are considered low risk for cancer of the pelvic organs (ovaries, uterus, and vagina) and who do not have symptoms. A pelvic examination may be necessary if you have symptoms including those associated with pelvic infections. Ask your health care provider if a screening pelvic exam is right for you.   The Pap test is the screening test for cervical cancer for women who are considered at risk.  If you had a hysterectomy for a problem that was not cancer or a condition that could lead to cancer, then you no longer need Pap tests.  If you are older than 65 years, and you have had  normal Pap tests for the past 10 years, you no longer need to have Pap tests.  If you have had past treatment for cervical cancer or a condition that could lead to cancer, you need Pap tests and screening for cancer for at least 20 years after your treatment.  If you no longer get a Pap test, assess your risk factors if they change (such as having a new sexual partner). This can affect whether you should start being screened again.  Some women have medical problems that increase their chance of getting cervical cancer. If this is the case for you, your health care provider may recommend more frequent screening and Pap tests.  The human papillomavirus (HPV) test  is another test that may be used for cervical cancer screening. The HPV test looks for the virus that can cause cell changes in the cervix. The cells collected during the Pap test can be tested for HPV.  The HPV test can be used to screen women 26 years of age and older. Getting tested for HPV can extend the interval between normal Pap tests from three to five years.  An HPV test also should be used to screen women of any age who have unclear Pap test results.  After 71 years of age, women should have HPV testing as often as Pap tests.  Colorectal Cancer  This type of cancer can be detected and often prevented.  Routine colorectal cancer screening usually begins at 71 years of age and continues through 71 years of age.  Your health care provider may recommend screening at an earlier age if you have risk factors for colon cancer.  Your health care provider may also recommend using home test kits to check for hidden blood in the stool.  A small camera at the end of a tube can be used to examine your colon directly (sigmoidoscopy or colonoscopy). This is done to check for the earliest forms of colorectal cancer.  Routine screening usually begins at age 64.  Direct examination of the colon should be repeated every 5-10 years through  71 years of age. However, you may need to be screened more often if early forms of precancerous polyps or small growths are found. Skin Cancer  Check your skin from head to toe regularly.  Tell your health care provider about any new moles or changes in moles, especially if there is a change in a mole's shape or color.  Also tell your health care provider if you have a mole that is larger than the size of a pencil eraser.  Always use sunscreen. Apply sunscreen liberally and repeatedly throughout the day.  Protect yourself by wearing long sleeves, pants, a wide-brimmed hat, and sunglasses whenever you are outside. HEART DISEASE, DIABETES, AND HIGH BLOOD PRESSURE   Have your blood pressure checked at least every 1-2 years. High blood pressure causes heart disease and increases the risk of stroke.  If you are between 68 years and 3 years old, ask your health care provider if you should take aspirin to prevent strokes.  Have regular diabetes screenings. This involves taking a blood sample to check your fasting blood sugar level.  If you are at a normal weight and have a low risk for diabetes, have this test once every three years after 71 years of age.  If you are overweight and have a high risk for diabetes, consider being tested at a younger age or more often. PREVENTING INFECTION  Hepatitis B  If you have a higher risk for hepatitis B, you should be screened for this virus. You are considered at high risk for hepatitis B if:  You were born in a country where hepatitis B is common. Ask your health care provider which countries are considered high risk.  Your parents were born in a high-risk country, and you have not been immunized against hepatitis B (hepatitis B vaccine).  You have HIV or AIDS.  You use needles to inject street drugs.  You live with someone who has hepatitis B.  You have had sex with someone who has hepatitis B.  You get hemodialysis treatment.  You take  certain medicines for conditions, including cancer, organ transplantation, and autoimmune conditions. Hepatitis  C  Blood testing is recommended for:  Everyone born from [birth year] through 1967.  Anyone with known risk factors for hepatitis C. Sexually transmitted infections (STIs)  You should be screened for sexually transmitted infections (STIs) including gonorrhea and chlamydia if:  You are sexually active and are younger than 71 years of age.  You are older than 71 years of age and your health care provider tells you that you are at risk for this type of infection.  Your sexual activity has changed since you were last screened and you are at an increased risk for chlamydia or gonorrhea. Ask your health care provider if you are at risk.  If you do not have HIV, but are at risk, it may be recommended that you take a prescription medicine daily to prevent HIV infection. This is called pre-exposure prophylaxis (PrEP). You are considered at risk if:  You are sexually active and do not regularly use condoms or know the HIV status of your partner(s).  You take drugs by injection.  You are sexually active with a partner who has HIV. Talk with your health care provider about whether you are at high risk of being infected with HIV. If you choose to begin PrEP, you should first be tested for HIV. You should then be tested every 3 months for as long as you are taking PrEP.  PREGNANCY   If you are premenopausal and you may become pregnant, ask your health care provider about preconception counseling.  If you may become pregnant, take 400 to 800 micrograms (mcg) of folic acid every day.  If you want to prevent pregnancy, talk to your health care provider about birth control (contraception). OSTEOPOROSIS AND MENOPAUSE   Osteoporosis is a disease in which the bones lose minerals and strength with aging. This can result in serious bone fractures. Your risk for osteoporosis can be identified using a  bone density scan.  If you are 7 years of age or older, or if you are at risk for osteoporosis and fractures, ask your health care provider if you should be screened.  Ask your health care provider whether you should take a calcium or vitamin D supplement to lower your risk for osteoporosis.  Menopause may have certain physical symptoms and risks.  Hormone replacement therapy may reduce some of these symptoms and risks. Talk to your health care provider about whether hormone replacement therapy is right for you.  HOME CARE INSTRUCTIONS   Schedule regular health, dental, and eye exams.  Stay current with your immunizations.   Do not use any tobacco products including cigarettes, chewing tobacco, or electronic cigarettes.  If you are pregnant, do not drink alcohol.  If you are breastfeeding, limit how much and how often you drink alcohol.  Limit alcohol intake to no more than 1 drink per day for nonpregnant women. One drink equals 12 ounces of beer, 5 ounces of wine, or 1 ounces of hard liquor.  Do not use street drugs.  Do not share needles.  Ask your health care provider for help if you need support or information about quitting drugs.  Tell your health care provider if you often feel depressed.  Tell your health care provider if you have ever been abused or do not feel safe at home. Document Released: 06/02/2011 Document Revised: 04/03/2014 Document Reviewed: 10/19/2013 [facility] Patient Information 2015 [place], [place]. This information is not intended to replace advice given to you by your health care provider. Make sure you discuss any questions  you have with your health care provider.

## 2015-05-30 NOTE — Progress Notes (Signed)
Subjective:   Wendy Patrick is a 69 y.o. female who presents for Medicare Annual (Subsequent) preventive examination.  Review of Systems:   HRA assessment completed during visit;  Patient is here for Annual Wellness Assessment  Problem list review for risk CAD and stroke secondary to hyperlipidemia Lipids 10/23/2014; chol 278; Trig 122; HDL 59; LDL 194/ states she cannot take statin meds; referred to pharmacist who told her she cannot take statin drugs; but focuses on plant fat; low sugar diet Takes Metamucil but caused so much gas;  Has been on [place] diet; which suggest plant fat; no sugar; no bread;  3 meals a day; uses recipes from book; states she feels much better;   BMI: 21.8 Diet; [place];  Exercise; arms and legs; core work; was walking in the park; does not like treadmill;  Capillary glucose 96 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 Felecia Shelling, [place] for fup ? MS States she had pneumonia at [place] and not sure of year; Declines any more at this time Had 27 in 2011/    Family Hx  Social history: patient and spouse live in 68 apts;   Personalized education given regarding risk; Understands the LDL and is committed to diet Would like to go back to work; but understands limitations to doing that;   Psychosocial support; safe community; firearm safety; smoke alarms; Lives in 70 yo community [facility]   Discussed Goal to improve health based on risk       Objective:     Vitals: BP 98/60 mmHg  Pulse 71  Temp(Src) 97.7 F (36.5 C)  Ht 5\' 4"  (1.626 m)  Wt 127 lb 12 oz (57.947 kg)  BMI 21.92 kg/m2  SpO2 98%  Tobacco History  Smoking status  . Former Smoker -- 2.00 packs/day for 50  years  . Types: Cigarettes  . Quit date: 02/05/2000  Smokeless tobacco  . Never Used    Comment: Educated LDCT for lung Ca to discuss with MD if interested     Counseling given: Yes   Past Medical History  Diagnosis Date  . COPD (chronic obstructive pulmonary disease)   . Hyperlipidemia   . Endometriosis     1986  . Diabetes mellitus without complication   . Gestational diabetes     age 50  . Optic neuritis     2015  . Coronary artery disease 08/2009    40% mid left circ by cath in [place], [place]  . Optic neuritis Novembr   Past Surgical History  Procedure Laterality Date  . Eye surgery    . Cardiac catheterization    . Tubal ligation    . Minor hemorrhoidectomy    . Abdominal hysterectomy     Family History  Problem Relation Age of Onset  . Cancer Mother     Breast   . Stroke Brother     open heart surgery    History  Sexual Activity  . Sexual Activity: No    Outpatient Encounter Prescriptions as of 05/30/2015  Medication Sig  . aspirin 81 MG tablet Take 81 mg by mouth daily.   No facility-administered encounter medications on file as of 05/30/2015.    Activities of Daily Living In  your present state of health, do you have any difficulty performing the following activities: 05/30/2015 10/09/2014  Hearing? Y N  Vision? Y Y  Difficulty concentrating or making decisions? Y N  Walking or climbing stairs? N N  Dressing or bathing? N N  Doing errands, shopping? N N    Patient Care Team: Olga Millers, MD as PCP - General (Internal Medicine)    Assessment:    Objective:   The goal of the wellness visit is to assist the patient how to close the gaps in care and create a preventative care plan for the patient.  Personalized Education was given regarding: chol; diet and exericse  Pt determined a personalized goal; see patient goals; discussed; in process of being tx  Assessment included: smoking (secondary) educated as appropriate; including LDCT if  as appropriate and will discuss with md Bone density scan as appropriate/ osteopenic;  Calcium and Vit D as appropriate/ Osteoporosis risk reviewed Taking meds without issues; no barriers identified Labs were and fup visit noted with MD if labs are due to be re-drawn. Stress: Recommendations for managing stress if assessed as a factor;  No Risk for hepatitis or high risk social behavior identified via hepatitis screen Educated on shingles and follow up with insurance company for co-pays or charges applied to Part D benefit. Educated on Vaccines;  Declines prevnar at this time Safety issues reviewed; Cognition assessed by AD8; Score 0 Does c/o of some memory issues but will refer to neurologist; no major issues identified with ADL' IADL   Does c/o of some urinary issues followed in [place]; voids slow; takes time to empty     Exercise Activities and Dietary recommendations    Goals    . Reduce fat intake to X grams per day     Will continue on the [place] diet; continue with home exercise        Fall Risk Fall Risk  05/30/2015  Falls in the past year? Yes  Injury with Fall? No  Risk for fall due to : Other (Comment)  Risk for fall due to (comments): resolved    Depression Screen PHQ 2/9 Scores 05/30/2015  PHQ - 2 Score 0     Cognitive Testing MMSE - Mini Mental State Exam 05/30/2015  Not completed: Unable to complete    Immunization History  Administered Date(s) Administered  . Pneumococcal Polysaccharide-23 12/01/2009  . Tdap 12/01/2010  . Zoster 12/02/2011   Screening Tests Health Maintenance  Topic Date Due  . COLONOSCOPY  08/30/1994  . DEXA SCAN  08/30/2009  . MAMMOGRAM  09/28/2009  . PNA vac Low Risk Adult (2 of 2 - PCV13) 12/01/2010  . INFLUENZA VACCINE  07/02/2015  . TETANUS/TDAP  12/01/2020  . ZOSTAVAX  Completed      Plan:     Plan   The patient agrees to: Discuss ongoing osteopenia with MD Also will order mammogram this  visit   Advanced directive: discussed and will complete on her own    During the course of the visit the patient was educated and counseled about the following appropriate screening and preventive services:   Vaccines to include Pneumoccal, Influenza, Hepatitis B, Td, Zostavax, HCV  Electrocardiogram/ completed  Cardiovascular Disease; educated and being followed  Colorectal cancer screening/ declines; but had x 2/ discussed colo guard option  Bone density screening: will discuss ongoing tx with Dr. Doug Sou  Diabetes screening/ did have gestational dm   Glaucoma screening; completed  Mammography/ to be scheduled  Nutrition counseling   Patient Instructions (the written plan) was given to the patient.   Wynetta Fines, RN  05/30/2015

## 2015-05-30 NOTE — Progress Notes (Signed)
Medical screening examination/treatment/procedure(s) were performed by non-physician practitioner and as supervising physician I was immediately available for consultation/collaboration. I agree with above. Kollar, Elizabeth A, MD   

## 2015-05-30 NOTE — Progress Notes (Signed)
   Subjective:    Patient ID: Wendy Patrick, female    DOB: Apr 25, 1946, 69 y.o.   MRN: [id number]  HPI The patient is a 69 YO female coming in for acute visit same day as her medicare wellness for cough. Going on for 1-2 days, no fevers or chills. Some nasal congestion. No sore throat or SOB. Not productive. She is not taking anything for it except claritin which has not helped. Not worsening.   Review of Systems  HENT: Positive for congestion. Negative for ear discharge, ear pain, nosebleeds, postnasal drip, rhinorrhea, sinus pressure, sore throat and trouble swallowing.   Eyes: Negative.   Respiratory: Positive for cough. Negative for chest tightness, shortness of breath and wheezing.   Cardiovascular: Negative.   Gastrointestinal: Negative.       Objective:   Physical Exam  Constitutional: She appears well-developed and well-nourished.  HENT:  Head: Normocephalic and atraumatic.  Right Ear: External ear normal.  Left Ear: External ear normal.  Oropharynx with mild erythema, no purulent discharge.   Eyes: EOM are normal.  Neck: Normal range of motion. No JVD present.  Cardiovascular: Normal rate and regular rhythm.   Pulmonary/Chest: Effort normal and breath sounds normal.  Abdominal: Soft.  Lymphadenopathy:    She has no cervical adenopathy.   Filed Vitals:   05/30/15 0841  BP: 98/60  Pulse: 71  Temp: 97.7 F (36.5 C)  Height: 5\' 4"  (1.626 m)  Weight: 127 lb 12 oz (57.947 kg)  SpO2: 98%      Assessment & Plan:

## 2015-05-31 ENCOUNTER — Encounter: Payer: Self-pay | Admitting: Internal Medicine

## 2015-05-31 DIAGNOSIS — J309 Allergic rhinitis, unspecified: Secondary | ICD-10-CM | POA: Insufficient documentation

## 2015-05-31 NOTE — Assessment & Plan Note (Signed)
Failed claritin and will rx xyzal. Also rx for tessalon perles. No indication for antibiotics today.

## 2015-06-07 ENCOUNTER — Ambulatory Visit (INDEPENDENT_AMBULATORY_CARE_PROVIDER_SITE_OTHER): Payer: Medicare HMO | Admitting: Neurology

## 2015-06-07 ENCOUNTER — Encounter: Payer: Self-pay | Admitting: Neurology

## 2015-06-07 VITALS — BP 100/68 | HR 64 | Resp 14 | Ht 64.0 in | Wt 127.4 lb

## 2015-06-07 DIAGNOSIS — R269 Unspecified abnormalities of gait and mobility: Secondary | ICD-10-CM

## 2015-06-07 DIAGNOSIS — H8110 Benign paroxysmal vertigo, unspecified ear: Secondary | ICD-10-CM | POA: Diagnosis not present

## 2015-06-07 DIAGNOSIS — H469 Unspecified optic neuritis: Secondary | ICD-10-CM | POA: Diagnosis not present

## 2015-06-07 NOTE — Progress Notes (Signed)
Marland Kitchen   [facility]  PATIENT: Wendy Patrick DOB: 08-10-1946  REASON FOR VISIT: optic neuritis                                 HISTORICAL  CHIEF COMPLAINT:  Chief Complaint  Patient presents with  . History of Optic Neuritis    Sts. she is currently battling allergies--has much sinus congestion--has had some intermittent short-lived blurry vision in left eye while she is having these symptoms--otherwise has had no further trouble with vision./fim  . History of Optic Neuritis    **She saw her opthalmologist (Dr. Leanne Lovely) last week and sts. he found no problems/fim    HISTORY OF PRESENT ILLNESS:  Wendy Patrick is a 69 year old woman who had an episode of left optic neuritis in November 2015. After her last visit I reviewed the MRI of the brain that showed multiple deep white matter, subcortical and periventricular foci in a pattern more consistent with chronic small vessel ischemic changes than to multiple sclerosis.   Her spinal fluid was not truly consistent with MS. She had normal IgG index and normal oligoclonal bands were not present (the report states 5 bands appeared more prominent).    Since her last visit, she denies any new neurologic symptom lasting over a day.   She had another eye exam last month and was told everything looked good.     She had left eye pain x 1 hour but no change in vision a couplel months ago.     She feels her gait is mildly off and she veers to one side or the other.   THis has been going on about a year.     She often has ringing in her ears.     The vertigo is translational where she feels like she is being pushed a little bit to the one side (or the other). She notes this most when she is dancing and after a spin feels like she is being pushed one way. However, she has also  noted this while walking down a hallway.  These episodes are occurring less frequently than they used to. Occasionally she has tinnitus  REVIEW OF SYSTEMS:  Constitutional:  No fevers, chills, sweats, or change in appetite.   Decreased energy is noted Eyes: No visual changes, double vision, eye pain Ear, nose and throat: No hearing loss, ear pain, nasal congestion, sore throat.   She has a spinning sensation and tinnitus.   Cardiovascular: No chest pain, palpitations Respiratory:  No shortness of breath at rest or with exertion.   No wheezes GastrointestinaI: No nausea, vomiting, diarrhea, abdominal pain, fecal incontinence Genitourinary:  as above. Musculoskeletal:  No neck pain, back pain Integumentary: No rash, pruritus, skin lesions Neurological: as above Psychiatric: No depression at this time.  No anxiety Endocrine: No palpitations, diaphoresis, change in appetite, change in weigh or increased thirst Hematologic/Lymphatic:  No anemia, purpura, petechiae. Allergic/Immunologic: No itchy/runny eyes, nasal congestion, recent allergic reactions, rashes  ALLERGIES: Allergies  Allergen Reactions  . Crestor [Rosuvastatin] Other (See Comments)    Leg cramps  . Latex Rash    HOME MEDICATIONS: Outpatient Prescriptions Prior to Visit  Medication Sig Dispense Refill  . aspirin 81 MG tablet Take 81 mg by mouth daily.    . benzonatate (TESSALON) 100 MG capsule Take 1 capsule (100 mg total) by mouth 2 (two) times daily as needed for cough. 30 capsule  0  . levocetirizine (XYZAL) 5 MG tablet Take 1 tablet (5 mg total) by mouth every evening. 30 tablet 3   No facility-administered medications prior to visit.    PAST MEDICAL HISTORY: Past Medical History  Diagnosis Date  . COPD (chronic obstructive pulmonary disease)   . Hyperlipidemia   . Endometriosis     1986  . Diabetes mellitus without complication   . Gestational diabetes     age 29  . Optic neuritis     2015  . Coronary artery disease 08/2009    40% mid left circ by cath in [place], [place]  . Optic neuritis Novembr    PAST SURGICAL HISTORY: Past Surgical History  Procedure Laterality Date  .  Eye surgery    . Cardiac catheterization    . Tubal ligation    . Minor hemorrhoidectomy    . Abdominal hysterectomy      FAMILY HISTORY: Family History  Problem Relation Age of Onset  . Cancer Mother     Breast   . Stroke Brother     open heart surgery     SOCIAL HISTORY:  History   Social History  . Marital Status: Married    Spouse Name: N/A  . Number of Children: N/A  . Years of Education: N/A   Occupational History  . Not on file.   Social History Main Topics  . Smoking status: Former Smoker -- 2.00 packs/day for 50 years    Types: Cigarettes    Quit date: 02/05/2000  . Smokeless tobacco: Never Used     Comment: Educated LDCT for lung Ca to discuss with MD if interested  . Alcohol Use: 0.0 oz/week    0 Standard drinks or equivalent per week     Comment: wine with dinner   . Drug Use: No  . Sexual Activity: No   Other Topics Concern  . Not on file   Social History Narrative     PHYSICAL EXAM  Filed Vitals:   06/07/15 0935  BP: 100/68  Pulse: 64  Resp: 14  Height: 5\' 4"  (1.626 m)  Weight: 127 lb 6.4 oz (57.788 kg)    Body mass index is 21.86 kg/(m^2).   General: The patient is well-developed and well-nourished and in no acute distress  Eyes:  Funduscopic exam shows mild left optic disc pallor and normal  retinal vessels.  Ears:   Hearing symmetric and TMI.  Weber does not lateralize.     Neck: The neck has good ROM.  The neck is nontender.   Neurologic Exam  Mental status: The patient is alert and oriented x 3 at the time of the examination. The patient has apparent normal recent and remote memory, with an apparently normal attention span and concentration ability.   Speech is normal.  Cranial nerves: Extraocular movements are full. Pupils show 1+ left APD.  Visual fields are full.  Color vision is desaturated out of the left eye.    Facial symmetry is present. There is good facial sensation to soft touch bilaterally.Facial strength is  normal.  Trapezius and sternocleidomastoid strength is normal. No dysarthria is noted.  The tongue is midline, and the patient has symmetric elevation of the soft palate. No obvious hearing deficits are noted.  Motor:  Muscle bulk and tone are normal. Strength is  5 / 5 in all 4 extremities.   Sensory: Sensory testing is intact to  touch in all 4 extremities.  Coordination: Cerebellar testing reveals good finger-nose-finger bilaterally.  Gait and station: Station and gait are normal. Tandem gait is normal for age (minimaly wide). Romberg is negative.      Reflexes: Deep tendon reflexes are symmetric and normal bilaterally. Plantar responses are normal.     DIAGNOSTIC DATA (LABS, IMAGING, TESTING) - I reviewed patient records, labs, notes, testing and imaging myself where available.         ASSESSMENT AND PLAN  Optic neuritis - Plan: MR Brain W Wo Contrast  Benign paroxysmal positional vertigo, unspecified laterality - Plan: MR Brain W Wo Contrast  Gait disturbance   1.   MRI of the brain w/wo contrast to rule out subclinical progression that would be more concerning for multiple sclerosis. We discussed that if there has been no progression over 9 months or so, the likelihood of MS becomes even smaller 2.   She is advised to remain active and exercises as tolerated. Her gait is minimally wide and if this worsens, she should consider using a cane or other device. 3.   She will return to see me as needed advised to call if she has any new or worsening neurologic symptoms. I will schedule a follow-up if the MRI shows progression to go over her options.  Sanjuana Mruk A. Felecia Shelling, MD, PhD 04/03/6269, 35:00 AM Certified in Neurology, Clinical Neurophysiology, Sleep Medicine, Pain Medicine and Neuroimaging  [facility] [address] [place], [place] [address] [phone]  Addendum:    The images from the MRI of the brain and orbits became available for my review.    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  No atrophy.    No enhancement of any brain foci.  No abnl DWI  MRI of the orbits show enhancement of the left optic nerve.    Left ON is more likely to be idiopathic than be related to MS but chance is still about 25%.   We will re-check another MRI in 6 months or so to determine if there are any changes over time suggestive of demyelination.      --RAS

## 2015-07-09 ENCOUNTER — Ambulatory Visit
Admission: RE | Admit: 2015-07-09 | Discharge: 2015-07-09 | Disposition: A | Discharge status: Home / Self Care | Payer: Medicare HMO | Source: Ambulatory Visit | Attending: Neurology | Admitting: Neurology

## 2015-07-09 DIAGNOSIS — H8110 Benign paroxysmal vertigo, unspecified ear: Secondary | ICD-10-CM

## 2015-07-09 DIAGNOSIS — H469 Unspecified optic neuritis: Secondary | ICD-10-CM

## 2015-07-09 MED ORDER — GADOBENATE DIMEGLUMINE 529 MG/ML IV SOLN
13.0000 mL | Freq: Once | INTRAVENOUS | Status: AC | PRN
  Administered 2015-07-09: 13 mL via INTRAVENOUS

## 2015-07-13 ENCOUNTER — Telehealth: Payer: Self-pay | Admitting: *Deleted

## 2015-07-13 NOTE — Telephone Encounter (Signed)
-----   Message from Britt Bottom, MD sent at 07/11/2015  6:13 PM EDT ----- Please note that the MRI has not shown any changes when compared to the MRI from last year.

## 2015-07-13 NOTE — Telephone Encounter (Signed)
I have spoken with Wendy Patrick this morning and per RAS, advised that when compared, there are no changes in most recent mri brain and mri brain from last yr.  She verbalized understanding of same/fim

## 2015-07-17 ENCOUNTER — Ambulatory Visit (HOSPITAL_COMMUNITY)
Admission: RE | Admit: 2015-07-17 | Discharge: 2015-07-17 | Disposition: A | Discharge status: Home / Self Care | Payer: Medicare HMO | Source: Ambulatory Visit | Attending: Internal Medicine | Admitting: Internal Medicine

## 2015-07-17 DIAGNOSIS — Z1231 Encounter for screening mammogram for malignant neoplasm of breast: Secondary | ICD-10-CM | POA: Insufficient documentation

## 2015-07-20 ENCOUNTER — Encounter: Payer: Self-pay | Admitting: Internal Medicine

## 2015-07-20 ENCOUNTER — Ambulatory Visit (INDEPENDENT_AMBULATORY_CARE_PROVIDER_SITE_OTHER): Payer: Medicare HMO | Admitting: Internal Medicine

## 2015-07-20 VITALS — BP 112/72 | HR 70 | Temp 97.9°F | Resp 18 | Ht 64.0 in | Wt 131.4 lb

## 2015-07-20 DIAGNOSIS — J439 Emphysema, unspecified: Secondary | ICD-10-CM | POA: Diagnosis not present

## 2015-07-20 MED ORDER — AZITHROMYCIN 250 MG PO TABS: ORAL_TABLET | ORAL | Status: DC

## 2015-07-20 MED ORDER — PREDNISONE 20 MG PO TABS: 40.0000 mg | ORAL_TABLET | Freq: Every day | ORAL | Status: DC

## 2015-07-20 NOTE — Patient Instructions (Signed)
We have sent in 2 medicines for you. The first is prednisone, take 2 pills a day until gone. The other is the antibiotic called azithromycin (or z-pack). Take 2 pills the first day and then 1 pill a day after that.   I recommend stopping the xyzal to see if the spots on your hand go away. You can take something over the counter for the allergies if you need it.

## 2015-07-20 NOTE — Progress Notes (Signed)
Pre visit review using our clinic review tool, if applicable. No additional management support is needed unless otherwise documented below in the visit note. 

## 2015-07-21 NOTE — Progress Notes (Signed)
   Subjective:    Patient ID: Wendy Patrick, female    DOB: 10/31/1946, 69 y.o.   MRN: [id number]  HPI The patient is a 69 YO female coming in for follow up of a cough. She was seen about 2 months ago and we added xyzal as she had cough thought to be related to post-nasal drip. She has been taking the xyzal intermittently due to extreme sleepiness with taking (at night) even during the day. She notes that it does help with her nasal congestion but has never helped with the cough. She also thinks that it has given her some rash on her fingers which is new since starting the medicine. No nasal congestion now. Some SOB with walking from the lungs. Coughing so much that she sometimes retches. No fevers or chills but yellow sputum for the last 3-4 weeks.   Review of Systems  Constitutional: Positive for activity change and fatigue. Negative for fever, chills, appetite change and unexpected weight change.  HENT: Negative for congestion, ear discharge, ear pain, nosebleeds, postnasal drip, rhinorrhea, sinus pressure, sore throat and trouble swallowing.   Eyes: Negative.   Respiratory: Positive for cough, shortness of breath and wheezing. Negative for chest tightness.   Cardiovascular: Negative.   Gastrointestinal: Negative.       Objective:   Physical Exam  Constitutional: She appears well-developed and well-nourished.  HENT:  Head: Normocephalic and atraumatic.  Right Ear: External ear normal.  Left Ear: External ear normal.  Oropharynx with mild erythema, no purulent discharge.   Eyes: EOM are normal.  Neck: Normal range of motion. No JVD present.  Cardiovascular: Normal rate and regular rhythm.   Pulmonary/Chest: Effort normal.  Mild wheeze diffusely, does not clear with cough.  Abdominal: Soft.  Lymphadenopathy:    She has no cervical adenopathy.   Filed Vitals:   07/20/15 1452  BP: 112/72  Pulse: 70  Temp: 97.9 F (36.6 C)  TempSrc: Oral  Resp: 18  Height: 5\' 4"  (1.626 m)    Weight: 131 lb 6.4 oz (59.603 kg)  SpO2: 98%      Assessment & Plan:

## 2015-07-21 NOTE — Assessment & Plan Note (Signed)
Concern for exacerbation with increased SOB and cough and sputum. Will do short course prednisone and azithromycin. Will also advise that she stop the xyzal due to rash and extreme fatigue and return to OTC medication for her allergies if needed.

## 2015-10-12 ENCOUNTER — Ambulatory Visit (INDEPENDENT_AMBULATORY_CARE_PROVIDER_SITE_OTHER): Payer: Medicare HMO | Admitting: Internal Medicine

## 2015-10-12 ENCOUNTER — Encounter: Payer: Self-pay | Admitting: Internal Medicine

## 2015-10-12 ENCOUNTER — Ambulatory Visit (INDEPENDENT_AMBULATORY_CARE_PROVIDER_SITE_OTHER)
Admission: RE | Admit: 2015-10-12 | Discharge: 2015-10-12 | Disposition: A | Discharge status: Home / Self Care | Payer: Medicare HMO | Source: Ambulatory Visit | Attending: Internal Medicine | Admitting: Internal Medicine

## 2015-10-12 VITALS — BP 110/72 | HR 91 | Temp 98.3°F | Resp 12 | Ht 64.0 in | Wt 134.8 lb

## 2015-10-12 DIAGNOSIS — J3089 Other allergic rhinitis: Secondary | ICD-10-CM

## 2015-10-12 DIAGNOSIS — R05 Cough: Secondary | ICD-10-CM

## 2015-10-12 DIAGNOSIS — R059 Cough, unspecified: Secondary | ICD-10-CM

## 2015-10-12 NOTE — Progress Notes (Signed)
   Subjective:    Patient ID: Wendy Patrick, female    DOB: [birth year], 69 y.o.   MRN: [id number]  HPI The patient is a 69 YO female coming in for her cough and sinuses. She has had many problems with them. Treated with antibiotics and steroids back in August and did not get any relief. Has been taking loratadine over the counter (the only one that does not make her excessively tired including xyzal). She has tried nasal corticosteroids and atrovent nasal spray without relief. Mild headache, nasal congestion and drainage. Cough (worse at night time). No fevers or chills. No ear pain or drainage.  Review of Systems  Constitutional: Positive for activity change and fatigue. Negative for fever, chills, appetite change and unexpected weight change.  HENT: Positive for congestion, postnasal drip, rhinorrhea and sinus pressure. Negative for ear discharge, ear pain, nosebleeds, sore throat and trouble swallowing.   Eyes: Negative.   Respiratory: Positive for cough and shortness of breath. Negative for chest tightness and wheezing.        SOB better  Cardiovascular: Negative.   Gastrointestinal: Negative.       Objective:   Physical Exam  Constitutional: She appears well-developed and well-nourished.  HENT:  Head: Normocephalic and atraumatic.  Right Ear: External ear normal.  Left Ear: External ear normal.  Oropharynx with erythema, no purulent discharge. Turbinates swollen  Eyes: EOM are normal.  Neck: Normal range of motion. No JVD present.  Cardiovascular: Normal rate and regular rhythm.   Pulmonary/Chest: Effort normal.  Mild wheeze right clears with cough, left clear, improved since last visit  Abdominal: Soft.  Lymphadenopathy:    She has no cervical adenopathy.   Filed Vitals:   10/12/15 1408  BP: 110/72  Pulse: 91  Temp: 98.3 F (36.8 C)  TempSrc: Oral  Resp: 12  Height: 5\' 4"  (1.626 m)  Weight: 134 lb 12.8 oz (61.145 kg)  SpO2: 98%      Assessment & Plan:

## 2015-10-12 NOTE — Progress Notes (Signed)
Pre visit review using our clinic review tool, if applicable. No additional management support is needed unless otherwise documented below in the visit note. 

## 2015-10-12 NOTE — Patient Instructions (Signed)
We are getting the chest x-ray today and getting you in with the allergy doctor.   We will call you back with the results.

## 2015-10-13 NOTE — Assessment & Plan Note (Signed)
She has been unable to tolerate xyzal and does not wish to try nasal corticosteroids due to bad reaction in the past. She wishes referral to allergist to get more information about her possible trigger to remove that instead of using medication. Talked to them about sinus rinses and they are not interested.

## 2015-10-19 DIAGNOSIS — H469 Unspecified optic neuritis: Secondary | ICD-10-CM | POA: Diagnosis not present

## 2015-10-19 DIAGNOSIS — Z961 Presence of intraocular lens: Secondary | ICD-10-CM | POA: Diagnosis not present

## 2015-10-19 DIAGNOSIS — H10413 Chronic giant papillary conjunctivitis, bilateral: Secondary | ICD-10-CM | POA: Diagnosis not present

## 2015-10-19 DIAGNOSIS — H40013 Open angle with borderline findings, low risk, bilateral: Secondary | ICD-10-CM | POA: Diagnosis not present

## 2015-10-19 DIAGNOSIS — H2512 Age-related nuclear cataract, left eye: Secondary | ICD-10-CM | POA: Diagnosis not present

## 2015-11-01 ENCOUNTER — Ambulatory Visit (INDEPENDENT_AMBULATORY_CARE_PROVIDER_SITE_OTHER): Payer: Medicare HMO | Admitting: Internal Medicine

## 2015-11-01 ENCOUNTER — Encounter: Payer: Self-pay | Admitting: Internal Medicine

## 2015-11-01 VITALS — BP 120/70 | HR 88 | Ht 64.0 in | Wt 135.6 lb

## 2015-11-01 DIAGNOSIS — R05 Cough: Secondary | ICD-10-CM | POA: Diagnosis not present

## 2015-11-01 DIAGNOSIS — R058 Other specified cough: Secondary | ICD-10-CM | POA: Insufficient documentation

## 2015-11-01 MED ORDER — PREDNISONE 10 MG PO TABS: ORAL_TABLET | ORAL | Status: DC

## 2015-11-01 MED ORDER — PANTOPRAZOLE SODIUM 40 MG PO TBEC: 40.0000 mg | DELAYED_RELEASE_TABLET | Freq: Every day | ORAL | Status: DC

## 2015-11-01 MED ORDER — FAMOTIDINE 20 MG PO TABS: ORAL_TABLET | ORAL | Status: DC

## 2015-11-01 MED ORDER — TRAMADOL HCL 50 MG PO TABS: ORAL_TABLET | ORAL | Status: DC

## 2015-11-01 NOTE — Progress Notes (Signed)
Subjective:    Patient ID: Wendy Patrick, female    DOB: 1946/05/21,    MRN: [id number]  HPI  23 yowf quit smoking 2001 but no resp problems at all until 3/ 2016 with onset cough never resolved so referred to pulmonary clinic 11/01/2015  by Dr Sharlet Salina   11/01/2015 1st [facility] office visit/ Wendy Patrick   Chief Complaint  Patient presents with  . PULMONARY CONSULT    Pt referred by Dr. Sharlet Salina for cough: pt states shes had this cough for at least 4 months. pt c/o having a lot of clear phlem coming up and at times a little yellowish. pt states the cough strangles her and it goes on all night.  pt c/o SOB, wheezing, cough and some chest tightness.  gradual onset since 01/2015  persistent daily cough to point of sense of strangling worse at hs  Prev eval by [facility] pulmonary for pndrainage x 4 y prior to OV  rx nasal spray didn't work but the drainage resolved over several months  No better with abx / xyzal to date Present 24/7 / no better with prednisone/ saba either   No obvious other patterns in day to day or daytime variabilty or assoc   cp or chest tightness,  overt sinus or hb symptoms. No unusual exp hx or h/o childhood pna/ asthma or knowledge of premature birth.  Sleeping ok without nocturnal  or early am exacerbation  of respiratory  c/o's or need for noct saba. Also denies any obvious fluctuation of symptoms with weather or environmental changes or other aggravating or alleviating factors except as outlined above   Current Medications, Allergies, Complete Past Medical History, Past Surgical History, Family History, and Social History were reviewed in [employer] record.             Review of Systems  Constitutional: Negative for fever and unexpected weight change.  HENT: Positive for postnasal drip and sinus pressure. Negative for congestion, dental problem, ear pain, nosebleeds, rhinorrhea, sneezing, sore throat and trouble swallowing.   Eyes:  Positive for redness and itching.  Respiratory: Positive for cough, chest tightness, shortness of breath and wheezing.   Cardiovascular: Negative for palpitations and leg swelling.  Gastrointestinal: Negative for nausea and vomiting.  Genitourinary: Negative for dysuria.  Musculoskeletal: Negative for joint swelling.  Skin: Negative for rash.  Hematological: Bruises/bleeds easily.  Psychiatric/Behavioral: The patient is not nervous/anxious.        Objective:   Physical Exam  amb wf freq throat clearing   Wt Readings from Last 3 Encounters:  11/01/15 135 lb 9.6 oz (61.508 kg)  10/12/15 134 lb 12.8 oz (61.145 kg)  07/20/15 131 lb 6.4 oz (59.603 kg)    Vital signs reviewed    HEENT: nl dentition, turbinates, and oropharynx. Nl external ear canals without cough reflex   NECK :  without JVD/Nodes/TM/ nl carotid upstrokes bilaterally   LUNGS: no acc muscle use,  Nl contour chest which is clear to A and P bilaterally without cough on insp or exp maneuvers   CV:  RRR  no s3 or murmur or increase in P2, no edema   ABD:  soft and nontender with nl inspiratory excursion in the supine position. No bruits or organomegaly, bowel sounds nl  MS:  Nl gait/ ext warm without deformities, calf tenderness, cyanosis or clubbing No obvious joint restrictions   SKIN: warm and dry without lesions    NEURO:  alert, approp, nl sensorium with  no motor deficits      I personally reviewed images and agree with radiology impression as follows:  CXR:  10/12/15 1. COPD. No acute pulmonary disease.  2. Mild cardiomegaly. No pulmonary venous congestion      Assessment & Plan:

## 2015-11-01 NOTE — Patient Instructions (Addendum)
The key to effective treatment for your cough is eliminating the non-stop cycle of cough you're stuck in long enough to let your airway heal completely and then see if there is anything still making you cough once you stop the cough suppression, but this should take no more than 5 days to figure out  First take delsym two tsp every 12 hours and supplement if needed with  tramadol 50 mg up to 2 every 4 hours to suppress the urge to cough at all or even clear your throat. Swallowing water or using ice chips/non mint and menthol containing candies (such as lifesavers or sugarless jolly ranchers) are also effective.  You should rest your voice and avoid activities that you know make you cough.  Once you have eliminated the cough for 3 straight days try reducing the tramadol first,  then the delsym as tolerated.     Prednisone 10 mg take  4 each am x 2 days,   2 each am x 2 days,  1 each am x 2 days and stop (this is to eliminate allergies and inflammation from coughing)  Protonix (pantoprazole) Take 30-60 min before first meal of the day and Pepcid 20 mg one bedtime plus chlorpheniramine 4 mg x 2 at bedtime (both available over the counter)  until cough is completely gone for at least a week without the need for cough suppression ( this can be taken during the day but causes drowsiness in some people)  GERD (REFLUX)  is an extremely common cause of respiratory symptoms, many times with no significant heartburn at all.    It can be treated with medication, but also with lifestyle changes including avoidance of late meals, excessive alcohol, smoking cessation, and avoid fatty foods, chocolate, peppermint, colas, red wine, and acidic juices such as orange juice.  NO MINT OR MENTHOL PRODUCTS SO NO COUGH DROPS  USE HARD CANDY INSTEAD (jolley ranchers or Stover's or Lifesavers (all available in sugarless versions) NO OIL BASED VITAMINS - use powdered substitutes.   Please schedule a follow up office visit  in 2 weeks, sooner if needed with all medications in hand

## 2015-11-03 ENCOUNTER — Encounter: Payer: Self-pay | Admitting: Internal Medicine

## 2015-11-03 NOTE — Assessment & Plan Note (Addendum)
MRI  07/09/15 The paranasal sinuses appear normal  The most common causes of chronic cough in immunocompetent adults include the following: upper airway cough syndrome (UACS), previously referred to as postnasal drip syndrome (PNDS), which is caused by variety of rhinosinus conditions; (2) asthma; (3) GERD; (4) chronic bronchitis from cigarette smoking or other inhaled environmental irritants; (5) nonasthmatic eosinophilic bronchitis; and (6) bronchiectasis.   These conditions, singly or in combination, have accounted for up to 94% of the causes of chronic cough in prospective studies.   Other conditions have constituted no >6% of the causes in prospective studies These have included bronchogenic carcinoma, chronic interstitial pneumonia, sarcoidosis, left ventricular failure, ACEI-induced cough, and aspiration from a condition associated with pharyngeal dysfunction.    Chronic cough is often simultaneously caused by more than one condition. A single cause has been found from 38 to 82% of the time, multiple causes from 18 to 62%. Multiply caused cough has been the result of three diseases up to 42% of the time.       Based on hx and exam, this is most likely:  Classic Upper airway cough syndrome, so named because it's frequently impossible to sort out how much is  CR/sinusitis with freq throat clearing (which can be related to primary GERD)   vs  causing  secondary (" extra esophageal")  GERD from wide swings in gastric pressure that occur with throat clearing, often  promoting self use of mint and menthol lozenges that reduce the lower esophageal sphincter tone and exacerbate the problem further in a cyclical fashion.   These are the same pts (now being labeled as having "irritable larynx syndrome" by some cough centers) who not infrequently have a history of having failed to tolerate ace inhibitors,  dry powder inhalers or biphosphonates or report having atypical reflux symptoms that don't respond to  standard doses of PPI , and are easily confused as having aecopd or asthma flares by even experienced allergists/ pulmonologists.   The first step is to maximize acid suppression and eliminate cyclical coughing then regroup if the cough persists.  I had an extended discussion with the patient reviewing all relevant studies completed to date and  lasting 35 min  1) Explained: The standardized cough guidelines published in Chest by Lissa Morales in 2006 are still the best available and consist of a multiple step process (up to 12!) , not a single office visit,  and are intended  to address this problem logically,  with an alogrithm dependent on response to empiric treatment at  each progressive step  to determine a specific diagnosis with  minimal addtional testing needed. Therefore if adherence is an issue or can't be accurately verified,  it's very unlikely the standard evaluation and treatment will be successful here.    Furthermore, response to therapy (other than acute cough suppression, which should only be used short term with avoidance of narcotic containing cough syrups if possible), can be a gradual process for which the patient may perceive immediate benefit.  Unlike going to an eye doctor where the best perscription is almost always the first one and is immediately effective, this is almost never the case in the management of chronic cough syndromes. Therefore the patient needs to commit up front to consistently adhere to recommendations  for up to 6 weeks of therapy directed at the likely underlying problem(s) before the response can be reasonably evaluated.     2) Each maintenance medication was reviewed in detail including most importantly  the difference between maintenance and prns and under what circumstances the prns are to be triggered using an action plan format that is not reflected in the computer generated alphabetically organized AVS.    Please see instructions for details which  were reviewed in writing and the patient given a copy highlighting the part that I personally wrote and discussed at today's ov.   See instructions for specific recommendations which were reviewed directly with the patient who was given a copy with highlighter outlining the key components.

## 2015-11-23 ENCOUNTER — Other Ambulatory Visit (INDEPENDENT_AMBULATORY_CARE_PROVIDER_SITE_OTHER): Payer: Medicare HMO

## 2015-11-23 ENCOUNTER — Encounter: Payer: Self-pay | Admitting: Internal Medicine

## 2015-11-23 ENCOUNTER — Ambulatory Visit (INDEPENDENT_AMBULATORY_CARE_PROVIDER_SITE_OTHER): Payer: Medicare HMO | Admitting: Internal Medicine

## 2015-11-23 DIAGNOSIS — R05 Cough: Secondary | ICD-10-CM

## 2015-11-23 DIAGNOSIS — R058 Other specified cough: Secondary | ICD-10-CM

## 2015-11-23 LAB — CBC WITH DIFFERENTIAL/PLATELET
BASOS PCT: 1 % (ref 0.0–3.0)
Basophils Absolute: 0.1 10*3/uL (ref 0.0–0.1)
EOS ABS: 0.3 10*3/uL (ref 0.0–0.7)
Eosinophils Relative: 4.2 % (ref 0.0–5.0)
HCT: 47.9 % — ABNORMAL HIGH (ref 36.0–46.0)
Hemoglobin: 15.7 g/dL — ABNORMAL HIGH (ref 12.0–15.0)
LYMPHS ABS: 1.5 10*3/uL (ref 0.7–4.0)
Lymphocytes Relative: 22.6 % (ref 12.0–46.0)
MCHC: 32.8 g/dL (ref 30.0–36.0)
MCV: 84.7 fl (ref 78.0–100.0)
MONO ABS: 0.8 10*3/uL (ref 0.1–1.0)
Monocytes Relative: 11.1 % (ref 3.0–12.0)
NEUTROS ABS: 4.1 10*3/uL (ref 1.4–7.7)
Neutrophils Relative %: 61.1 % (ref 43.0–77.0)
PLATELETS: 312 10*3/uL (ref 150.0–400.0)
RBC: 5.66 Mil/uL — ABNORMAL HIGH (ref 3.87–5.11)
RDW: 14.7 % (ref 11.5–15.5)
WBC: 6.8 10*3/uL (ref 4.0–10.5)

## 2015-11-23 MED ORDER — BENZONATATE 200 MG PO CAPS: ORAL_CAPSULE | ORAL | Status: DC

## 2015-11-23 MED ORDER — TRAMADOL HCL 50 MG PO TABS: ORAL_TABLET | ORAL | Status: DC

## 2015-11-23 MED ORDER — PREDNISONE 10 MG PO TABS: ORAL_TABLET | ORAL | Status: DC

## 2015-11-23 NOTE — Progress Notes (Signed)
Subjective:    Patient ID: Wendy Patrick, female    DOB: Jan 18, 1946,    MRN: [id number]    Brief patient profile:  56 yowf quit smoking 2001 but no resp problems at all until  01/2015 with onset cough never resolved so referred to pulmonary clinic 11/01/2015  by Dr Sharlet Salina   History of Present Illness  11/01/2015 1st [facility] office visit/ Ender Rorke   Chief Complaint  Patient presents with  . PULMONARY CONSULT    Pt referred by Dr. Sharlet Salina for cough: pt states shes had this cough for at least 4 months. pt c/o having a lot of clear phlem coming up and at times a little yellowish. pt states the cough strangles her and it goes on all night.  pt c/o SOB, wheezing, cough and some chest tightness.  gradual onset since 01/2015  persistent daily cough to point of sense of strangling worse at hs  Prev eval by [facility] pulmonary for pnd drainage x 4 y prior to [place]  rx nasal spray didn't work but the drainage resolved over several months  No better with abx / xyzal to date Present 24/7 / no better with prednisone/ saba either  rec Cyclical cough rx   [id number]  f/u ov/Cheetara Hoge re: has changed hx:  cough onset was a week of eye surgery Nov 2015  better on tramadol but never stopped  Chief Complaint  Patient presents with  . Follow-up    Pt states her cough has improved but then worsened again when she ran out of tramadol approx 5 days ago. Cough is non prod at this point. She c/o nasal congestion at night, not sleeping well.   did not use tramadol correctly, never took enough to eliminated cyclical coughing   No obvious day to day or daytime variability or assoc sob or cp or chest tightness, subjective wheeze or overt  hb symptoms. No unusual exp hx or h/o childhood pna/ asthma or knowledge of premature birth.  Sleeping ok without nocturnal  or early am exacerbation  of respiratory  c/o's or need for noct saba. Also denies any obvious fluctuation of symptoms with weather or environmental changes  or other aggravating or alleviating factors except as outlined above   Current Medications, Allergies, Complete Past Medical History, Past Surgical History, Family History, and Social History were reviewed in [employer] record.  ROS  The following are not active complaints unless bolded sore throat, dysphagia, dental problems, itching, sneezing,  nasal congestion or excess/ purulent secretions, ear ache,   fever, chills, sweats, unintended wt loss, classically pleuritic or exertional cp, hemoptysis,  orthopnea pnd or leg swelling, presyncope, palpitations, abdominal pain, anorexia, nausea, vomiting, diarrhea  or change in bowel or bladder habits, change in stools or urine, dysuria,hematuria,  rash, arthralgias, visual complaints, headache, numbness, weakness or ataxia or problems with walking or coordination,  change in mood/affect or memory.           Objective:   Physical Exam  amb wf freq throat clearing   11/23/2015      135     11/01/15 135 lb 9.6 oz (61.508 kg)  10/12/15 134 lb 12.8 oz (61.145 kg)  07/20/15 131 lb 6.4 oz (59.603 kg)    Vital signs reviewed    HEENT: nl dentition, turbinates, and oropharynx. Nl external ear canals without cough reflex   NECK :  without JVD/Nodes/TM/ nl carotid upstrokes bilaterally   LUNGS: no acc muscle use,  Nl contour  chest which is clear to A and P bilaterally without cough on insp or exp maneuvers   CV:  RRR  no s3 or murmur or increase in P2, no edema   ABD:  soft and nontender with nl inspiratory excursion in the supine position. No bruits or organomegaly, bowel sounds nl  MS:  Nl gait/ ext warm without deformities, calf tenderness, cyanosis or clubbing No obvious joint restrictions   SKIN: warm and dry without lesions    NEURO:  alert, approp, nl sensorium with  no motor deficits    Labs ordered 11/23/2015 allergy profile/ cbc with diff   I personally reviewed images and agree with radiology  impression as follows:  CXR:  10/12/15 1. COPD. No acute pulmonary disease.  2. Mild cardiomegaly. No pulmonary venous congestion      Assessment & Plan:

## 2015-11-23 NOTE — Patient Instructions (Addendum)
Please see patient coordinator before you leave today  to schedule sinus ct   Please remember to go to the lab   department downstairs for your tests - we will call you with the results when they are available.  The key to effective treatment for your cough is eliminating the non-stop cycle of cough you're stuck in long enough to let your airway heal completely and then see if there is anything still making you cough once you stop the cough suppression, but this should take no more than 5 days to figure out  First take tessilon every 4-6 hours   and supplement if needed with  tramadol 50 mg up to 2 every 4 hours to suppress EVEN  the urge to cough at all or even clear your throat. Swallowing water or using ice chips/non mint and menthol containing candies (such as lifesavers or sugarless jolly ranchers) are also effective.  You should rest your voice and avoid activities that you know make you cough.  Once you have eliminated the cough for 3 straight days try reducing the tramadol first,  then the tesslion as tolerated.     Prednisone 10 mg take  4 each am x 2 days,   2 each am x 2 days,  1 each am x 2 days and stop (this is to eliminate allergies and inflammation from coughing)  Protonix (pantoprazole) Take 30-60 min before first meal of the day and Pepcid 20 mg one bedtime plus chlorpheniramine 4 mg x 2 at bedtime (both available over the counter)  until cough is completely gone for at least a week without the need for cough suppression ( this can be taken during the day but causes drowsiness in some people)  For drainage / throat tickle try take CHLORPHENIRAMINE  4 mg - take one every 4 hours as needed - available over the counter- may cause drowsiness so start with just a bedtime dose or two and see how you tolerate it before trying in daytime    GERD (REFLUX)  is an extremely common cause of respiratory symptoms, many times with no significant heartburn at all.    It can be treated with  medication, but also with lifestyle changes including avoidance of late meals, excessive alcohol, smoking cessation, and avoid fatty foods, chocolate, peppermint, colas, red wine, and acidic juices such as orange juice.  NO MINT OR MENTHOL PRODUCTS SO NO COUGH DROPS  USE HARD CANDY INSTEAD (jolley ranchers or Stover's or Lifesavers (all available in sugarless versions) NO OIL BASED VITAMINS - use powdered substitutes.   Please schedule a follow up office visit in 2 weeks, sooner if needed with all medications in hand

## 2015-11-26 NOTE — Assessment & Plan Note (Addendum)
MRI  07/09/15 The paranasal sinuses appear normal - cyclical cough regimen [id number] >>> - Allergy profile 11/23/2015 >  Eos 0.3 >>> - sinus ct 12/06/2015  Still strongly favor chronic uacs.  I had an extended discussion with the patient reviewing all relevant studies completed to date and  lasting 15 to 20 minutes of a 25 minute visit on the following ongoing concerns:   1) The standardized cough guidelines published in Chest by Lissa Morales in 2006 are still the best available and consist of a multiple step process (up to 12!) , not a single office visit,  and are intended  to address this problem logically,  with an alogrithm dependent on response to empiric treatment at  each progressive step  to determine a specific diagnosis with  minimal addtional testing needed. Therefore if adherence is an issue or can't be accurately verified,  it's very unlikely the standard evaluation and treatment will be successful here.    Furthermore, response to therapy (other than acute cough suppression, which should only be used short term with avoidance of narcotic containing cough syrups if possible), can be a gradual process for which the patient may perceive immediate benefit.  Unlike going to an eye doctor where the best perscription is almost always the first one and is immediately effective, this is almost never the case in the management of chronic cough syndromes. Therefore the patient needs to commit up front to consistently adhere to recommendations  for up to 6 weeks of therapy directed at the likely underlying problem(s) before the response can be reasonably evaluated.   2) Each maintenance medication was reviewed in detail including most importantly the difference between maintenance and as needed and under what circumstances the prns are to be used.  Please see instructions for details which were reviewed in writing and the patient given a copy.    3) proceed with allergy testing and sinus ct and if  latter neg >  then methacholine challenge  vs empirical gabapentin rx next

## 2015-11-27 LAB — ALLERGY FULL PROFILE
Alternaria Alternata: <0.1 kU/L
Box Elder IgE: <0.1 kU/L
Candida Albicans: <0.1 kU/L
Cat Dander: <0.1 kU/L
Curvularia lunata: <0.1 kU/L
Elm IgE: <0.1 kU/L
Fescue: <0.1 kU/L
G009 Red Top: <0.1 kU/L
Goldenrod: <0.1 kU/L
HOUSE DUST HOLLISTER: 0.22 kU/L — AB
Helminthosporium halodes: <0.1 kU/L
IGE (IMMUNOGLOBULIN E), SERUM: 69 kU/L (ref <115)
Lamb's Quarters: <0.1 kU/L
Plantain: <0.1 kU/L

## 2015-12-02 HISTORY — PX: EYE SURGERY: SHX253

## 2015-12-06 ENCOUNTER — Ambulatory Visit (INDEPENDENT_AMBULATORY_CARE_PROVIDER_SITE_OTHER)
Admission: RE | Admit: 2015-12-06 | Discharge: 2015-12-06 | Disposition: A | Discharge status: Home / Self Care | Payer: Medicare HMO | Source: Ambulatory Visit | Attending: Internal Medicine | Admitting: Internal Medicine

## 2015-12-06 DIAGNOSIS — R0981 Nasal congestion: Secondary | ICD-10-CM | POA: Diagnosis not present

## 2015-12-06 DIAGNOSIS — R058 Other specified cough: Secondary | ICD-10-CM

## 2015-12-06 DIAGNOSIS — R05 Cough: Secondary | ICD-10-CM

## 2015-12-07 ENCOUNTER — Other Ambulatory Visit: Payer: Self-pay

## 2015-12-07 MED ORDER — AMOXICILLIN-POT CLAVULANATE 875-125 MG PO TABS: 1.0000 | ORAL_TABLET | Freq: Two times a day (BID) | ORAL | Status: DC

## 2015-12-11 ENCOUNTER — Ambulatory Visit: Payer: Medicare HMO | Admitting: Internal Medicine

## 2015-12-19 ENCOUNTER — Telehealth: Payer: Self-pay | Admitting: Internal Medicine

## 2015-12-19 NOTE — Telephone Encounter (Signed)
Attempted to contact pt. No answer, no option to leave a message. Will try back.  

## 2015-12-19 NOTE — Telephone Encounter (Signed)
augmentin was to treat the sinuses - this sounds like the flu or a possible side effect of augmentin and needs to be eval asap by primary or UC and to ER if worsens (we don't have the testing in the office to sort it out but I would stop augmentin at this point regardless as symptoms started while on it it and refer to ENT to do f/u before further abx) Dr Victorio Palm group

## 2015-12-19 NOTE — Telephone Encounter (Signed)
Pt still on 20 day augmentin rx, but for the past 2 days has had diarrhea, chills, low grade temp, chills, body aches, nonprod cough, sinus congestion, PND, stuffy nose. Pt uses [place].   MW please advise on recs.  Thanks.

## 2015-12-20 NOTE — Telephone Encounter (Signed)
Spoke with pt. She is aware of MW's recommendations. States that she is feeling much better today. Will go to an urgent care or ER if her symptoms return. Nothing further was needed.

## 2015-12-31 ENCOUNTER — Ambulatory Visit (INDEPENDENT_AMBULATORY_CARE_PROVIDER_SITE_OTHER): Payer: Medicare HMO | Admitting: Internal Medicine

## 2015-12-31 ENCOUNTER — Encounter: Payer: Self-pay | Admitting: Internal Medicine

## 2015-12-31 VITALS — BP 102/60 | HR 89

## 2015-12-31 DIAGNOSIS — R058 Other specified cough: Secondary | ICD-10-CM

## 2015-12-31 DIAGNOSIS — R05 Cough: Secondary | ICD-10-CM | POA: Diagnosis not present

## 2015-12-31 NOTE — Patient Instructions (Addendum)
Please see patient coordinator before you leave today  to schedule repeat  sinus ct    Protonix (pantoprazole) Take 30-60 min before first meal of the day and Pepcid 20 mg one bedtime plus chlorpheniramine 4 mg x 2 at bedtime (both available over the counter)  until cough is completely gone for at least a week without the need for cough suppression ( this can be taken during the day but causes drowsiness in some people)  For drainage / throat tickle try take CHLORPHENIRAMINE  4 mg - take one every 4 hours as needed - available over the counter- may cause drowsiness so start with just a bedtime dose or two and see how you tolerate it before trying in daytime    GERD (REFLUX)  is an extremely common cause of respiratory symptoms, many times with no significant heartburn at all.    It can be treated with medication, but also with lifestyle changes including avoidance of late meals, excessive alcohol, smoking cessation, and avoid fatty foods, chocolate, peppermint, colas, red wine, and acidic juices such as orange juice.  NO MINT OR MENTHOL PRODUCTS SO NO COUGH DROPS  USE HARD CANDY INSTEAD (jolley ranchers or Stover's or Lifesavers (all available in sugarless versions) NO OIL BASED VITAMINS - use powdered substitutes.   See Tammy NP   2 weeks or next available after that   with all your medications, even over the counter meds, separated in two separate bags, the ones you take no matter what vs the ones you stop once you feel better and take only as needed when you feel you need them.   Tammy  will generate for you a new user friendly medication calendar that will put [place] all on the same page re: your medication use.     Without this process, it simply isn't possible to assure that we are providing  your outpatient care  with  the attention to detail we feel you deserve.   If we cannot assure that you're getting that kind of care,  then we cannot manage your problem effectively from this clinic.  Once  you have seen Tammy and we are sure that we're all on the same page with your medication use she will arrange follow up with me.    Please schedule a follow up office visit in 2 weeks, sooner if needed with all medications in hand

## 2015-12-31 NOTE — Progress Notes (Signed)
Subjective:    Patient ID: Wendy Patrick, female    DOB: [birth year]    MRN: [id number]    Brief patient profile:  61 yowf quit smoking 2001 but no resp problems at all until  01/2015 with onset cough never resolved so referred to pulmonary clinic 11/01/2015  by Dr Sharlet Salina   History of Present Illness  11/01/2015 1st [facility] office visit/ Evanna Washinton   Chief Complaint  Patient presents with  . PULMONARY CONSULT    Pt referred by Dr. Sharlet Salina for cough: pt states shes had this cough for at least 4 months. pt c/o having a lot of clear phlem coming up and at times a little yellowish. pt states the cough strangles her and it goes on all night.  pt c/o SOB, wheezing, cough and some chest tightness.  gradual onset since 01/2015  persistent daily cough to point of sense of strangling worse at hs  Prev eval by [facility] pulmonary for pnd drainage x 4 y prior to [place]  rx nasal spray didn't work but the drainage resolved over several months  No better with abx / xyzal to date Present 24/7 / no better with prednisone/ saba either  rec Cyclical cough rx   [id number]  f/u ov/Ghazi Rumpf re: has changed hx:  cough onset was a week of eye surgery Nov 2015  better on tramadol but never stopped  Chief Complaint  Patient presents with  . Follow-up    Pt states her cough has improved but then worsened again when she ran out of tramadol approx 5 days ago. Cough is non prod at this point. She c/o nasal congestion at night, not sleeping well.   did not use tramadol correctly, never took enough to eliminated cyclical coughing  rec Please see patient coordinator before you leave today  to schedule sinus ct > pos acute sinusitis > augmentin x 20 days then ov  Please remember to go to the lab   department downstairs for your tests - we will call you with the results when they are available. The key to effective treatment for your cough is eliminating the non-stop cycle of cough you're stuck in long enough to let your  airway heal completely and then see if there is anything still making you cough once you stop the cough suppression, but this should take no more than 5 days to figure out First take tessilon every 4-6 hours   and supplement if needed with  tramadol 50 mg up to 2 every 4 hours to suppress EVEN  the urge to cough at all or even clear your throat. Swallowing water or using ice chips/non mint and menthol containing candies (such as lifesavers or sugarless jolly ranchers) are also effective.  You should rest your voice and avoid activities that you know make you cough. Once you have eliminated the cough for 3 straight days try reducing the tramadol first,  then the tesslion as tolerated.   Prednisone 10 mg take  4 each am x 2 days,   2 each am x 2 days,  1 each am x 2 days and stop (this is to eliminate allergies and inflammation from coughing) Protonix (pantoprazole) Take 30-60 min before first meal of the day and Pepcid 20 mg one bedtime plus chlorpheniramine 4 mg x 2 at bedtime (both available over the counter)  until cough is completely gone for at least a week without the need for cough suppression ( this can be taken during the day  but causes drowsiness in some people) For drainage / throat tickle try take CHLORPHENIRAMINE  4 mg - take one every 4 hours as needed - available over the counter- may cause drowsiness so start with just a bedtime dose or two and see how you tolerate it before trying in daytime   GERD  Diet  Please schedule a follow up office visit in 2 weeks, sooner if needed with all medications in hand    12/31/2015  f/u ov/Takoda Siedlecki re: cough since 10/2014 / did not bring all meds  Chief Complaint  Patient presents with  . Follow-up    Cough has improved some- some days does not cough at all. Pt states "I feel better than I have in a while".   not taking h1 as rec/ never completely eliminated the cough as rec but cough is better p 20 d augmentin   No obvious day to day or daytime  variability or assoc sob or cp or chest tightness, subjective wheeze or overt  hb symptoms. No unusual exp hx or h/o childhood pna/ asthma or knowledge of premature birth.  Sleeping ok without nocturnal  or early am exacerbation  of respiratory  c/o's or need for noct saba. Also denies any obvious fluctuation of symptoms with weather or environmental changes or other aggravating or alleviating factors except as outlined above   Current Medications, Allergies, Complete Past Medical History, Past Surgical History, Family History, and Social History were reviewed in [employer] record.  ROS  The following are not active complaints unless bolded sore throat, dysphagia, dental problems, itching, sneezing,  nasal congestion or excess/ purulent secretions, ear ache,   fever, chills, sweats, unintended wt loss, classically pleuritic or exertional cp, hemoptysis,  orthopnea pnd or leg swelling, presyncope, palpitations, abdominal pain, anorexia, nausea, vomiting, diarrhea  or change in bowel or bladder habits, change in stools or urine, dysuria,hematuria,  rash, arthralgias, visual complaints, headache, numbness, weakness or ataxia or problems with walking or coordination,  change in mood/affect or memory.           Objective:   Physical Exam  amb wf occ throat clearing persists   11/23/2015      135  > 12/31/2015  135    11/01/15 135 lb 9.6 oz (61.508 kg)  10/12/15 134 lb 12.8 oz (61.145 kg)  07/20/15 131 lb 6.4 oz (59.603 kg)    Vital signs reviewed    HEENT: nl dentition, turbinates, and oropharynx. Nl external ear canals without cough reflex   NECK :  without JVD/Nodes/TM/ nl carotid upstrokes bilaterally   LUNGS: no acc muscle use,  Nl contour chest which is clear to A and P bilaterally without cough on insp or exp maneuvers   CV:  RRR  no s3 or murmur or increase in P2, no edema   ABD:  soft and nontender with nl inspiratory excursion in the supine position. No  bruits or organomegaly, bowel sounds nl  MS:  Nl gait/ ext warm without deformities, calf tenderness, cyanosis or clubbing No obvious joint restrictions   SKIN: warm and dry without lesions    NEURO:  alert, approp, nl sensorium with  no motor deficits      I personally reviewed images and agree with radiology impression as follows:  CXR:  10/12/15 1. COPD. No acute pulmonary disease. 2. Mild cardiomegaly. No pulmonary venous congestion      Assessment & Plan:

## 2016-01-02 ENCOUNTER — Encounter: Payer: Self-pay | Admitting: Internal Medicine

## 2016-01-02 NOTE — Assessment & Plan Note (Signed)
-   cyclical cough regimen [id number] >>> - Allergy profile 11/23/2015 >  Eos 0.3 /  IgE  69 pos only to dust - sinus ct 12/06/2015> Air-fluid level over the right maxillary sinus with minimal opacification over the frontal ethmoidal recesses new compared to the prior exam and may represent mild acute sinusitis> rx with augmentin x 20 days and ov in 21 days  Not clear whether the source for the cough has been eradicated this point but she continues to have pattern that would suggest a cyclical upper airway cough regardless of the source.  The best option therefore is to continue cyclical cough regimen and repeat a sinus CT scan. If it still shows air-fluid levels the next step would be sinus evaluation by ENT.  I had an extended discussion with the patient reviewing all relevant studies completed to date and  lasting 15 to 20 minutes of a 25 minute visit    Each maintenance medication was reviewed in detail including most importantly the difference between maintenance and prns and under what circumstances the prns are to be triggered using an action plan format that is not reflected in the computer generated alphabetically organized AVS.    Please see instructions for details which were reviewed in writing and the patient given a copy highlighting the part that I personally wrote and discussed at today's ov.

## 2016-01-03 ENCOUNTER — Inpatient Hospital Stay: Admission: RE | Admit: 2016-01-03 | Payer: Medicare HMO | Source: Ambulatory Visit

## 2016-01-07 ENCOUNTER — Inpatient Hospital Stay: Admission: RE | Admit: 2016-01-07 | Payer: Medicare HMO | Source: Ambulatory Visit

## 2016-01-08 ENCOUNTER — Other Ambulatory Visit: Payer: Self-pay | Admitting: Internal Medicine

## 2016-01-08 DIAGNOSIS — R05 Cough: Secondary | ICD-10-CM

## 2016-01-08 DIAGNOSIS — R058 Other specified cough: Secondary | ICD-10-CM

## 2016-01-11 ENCOUNTER — Inpatient Hospital Stay: Admission: RE | Admit: 2016-01-11 | Payer: Medicare HMO | Source: Ambulatory Visit

## 2016-01-14 ENCOUNTER — Encounter: Payer: Medicare HMO | Admitting: Adult Health

## 2016-01-18 DIAGNOSIS — K219 Gastro-esophageal reflux disease without esophagitis: Secondary | ICD-10-CM | POA: Insufficient documentation

## 2016-01-18 DIAGNOSIS — J320 Chronic maxillary sinusitis: Secondary | ICD-10-CM | POA: Diagnosis not present

## 2016-01-18 DIAGNOSIS — J0141 Acute recurrent pansinusitis: Secondary | ICD-10-CM | POA: Insufficient documentation

## 2016-01-18 DIAGNOSIS — R05 Cough: Secondary | ICD-10-CM | POA: Diagnosis not present

## 2016-01-18 DIAGNOSIS — R053 Chronic cough: Secondary | ICD-10-CM | POA: Insufficient documentation

## 2016-02-19 DIAGNOSIS — R05 Cough: Secondary | ICD-10-CM | POA: Diagnosis not present

## 2016-02-19 DIAGNOSIS — J342 Deviated nasal septum: Secondary | ICD-10-CM | POA: Diagnosis not present

## 2016-02-19 DIAGNOSIS — K219 Gastro-esophageal reflux disease without esophagitis: Secondary | ICD-10-CM | POA: Diagnosis not present

## 2016-02-19 DIAGNOSIS — J0141 Acute recurrent pansinusitis: Secondary | ICD-10-CM | POA: Diagnosis not present

## 2016-02-19 DIAGNOSIS — J0191 Acute recurrent sinusitis, unspecified: Secondary | ICD-10-CM | POA: Diagnosis not present

## 2016-02-28 ENCOUNTER — Telehealth: Payer: Self-pay | Admitting: *Deleted

## 2016-02-28 NOTE — Telephone Encounter (Signed)
I called pt to see if she has received her 2016-17 Flu vaccine. She declines flu vaccine at this time.  

## 2016-04-09 DIAGNOSIS — L603 Nail dystrophy: Secondary | ICD-10-CM | POA: Diagnosis not present

## 2016-04-09 DIAGNOSIS — L309 Dermatitis, unspecified: Secondary | ICD-10-CM | POA: Diagnosis not present

## 2016-04-09 DIAGNOSIS — L859 Epidermal thickening, unspecified: Secondary | ICD-10-CM | POA: Diagnosis not present

## 2016-04-09 DIAGNOSIS — D485 Neoplasm of uncertain behavior of skin: Secondary | ICD-10-CM | POA: Diagnosis not present

## 2016-04-18 DIAGNOSIS — H10413 Chronic giant papillary conjunctivitis, bilateral: Secondary | ICD-10-CM | POA: Diagnosis not present

## 2016-04-18 DIAGNOSIS — H2512 Age-related nuclear cataract, left eye: Secondary | ICD-10-CM | POA: Diagnosis not present

## 2016-04-18 DIAGNOSIS — H40013 Open angle with borderline findings, low risk, bilateral: Secondary | ICD-10-CM | POA: Diagnosis not present

## 2016-04-18 DIAGNOSIS — H469 Unspecified optic neuritis: Secondary | ICD-10-CM | POA: Diagnosis not present

## 2016-04-18 DIAGNOSIS — Z961 Presence of intraocular lens: Secondary | ICD-10-CM | POA: Diagnosis not present

## 2016-06-05 ENCOUNTER — Other Ambulatory Visit (INDEPENDENT_AMBULATORY_CARE_PROVIDER_SITE_OTHER): Payer: Medicare HMO

## 2016-06-05 ENCOUNTER — Ambulatory Visit (INDEPENDENT_AMBULATORY_CARE_PROVIDER_SITE_OTHER): Payer: Medicare HMO | Admitting: Internal Medicine

## 2016-06-05 ENCOUNTER — Encounter: Payer: Self-pay | Admitting: Internal Medicine

## 2016-06-05 VITALS — BP 112/62 | HR 72 | Temp 97.8°F | Resp 16 | Ht 64.0 in | Wt 136.0 lb

## 2016-06-05 DIAGNOSIS — R058 Other specified cough: Secondary | ICD-10-CM

## 2016-06-05 DIAGNOSIS — Z0000 Encounter for general adult medical examination without abnormal findings: Secondary | ICD-10-CM

## 2016-06-05 DIAGNOSIS — E785 Hyperlipidemia, unspecified: Secondary | ICD-10-CM

## 2016-06-05 DIAGNOSIS — Z1159 Encounter for screening for other viral diseases: Secondary | ICD-10-CM | POA: Diagnosis not present

## 2016-06-05 DIAGNOSIS — Z23 Encounter for immunization: Secondary | ICD-10-CM

## 2016-06-05 DIAGNOSIS — R05 Cough: Secondary | ICD-10-CM | POA: Diagnosis not present

## 2016-06-05 DIAGNOSIS — H469 Unspecified optic neuritis: Secondary | ICD-10-CM

## 2016-06-05 DIAGNOSIS — Z1211 Encounter for screening for malignant neoplasm of colon: Secondary | ICD-10-CM

## 2016-06-05 LAB — LIPID PANEL
CHOLESTEROL: 266 mg/dL — AB (ref 0–200)
HDL: 64.1 mg/dL (ref >39.00)
LDL CALC: 166 mg/dL — AB (ref 0–99)
NONHDL: 201.7
Total CHOL/HDL Ratio: 4
Triglycerides: 179 mg/dL — ABNORMAL HIGH (ref 0.0–149.0)
VLDL: 35.8 mg/dL (ref 0.0–40.0)

## 2016-06-05 LAB — COMPREHENSIVE METABOLIC PANEL
ALK PHOS: 83 U/L (ref 39–117)
ALT: 16 U/L (ref 0–35)
AST: 21 U/L (ref 0–37)
Albumin: 4.1 g/dL (ref 3.5–5.2)
BUN: 14 mg/dL (ref 6–23)
CHLORIDE: 106 meq/L (ref 96–112)
CO2: 27 mEq/L (ref 19–32)
Calcium: 9.5 mg/dL (ref 8.4–10.5)
Creatinine, Ser: 1.06 mg/dL (ref 0.40–1.20)
GFR: 54.2 mL/min — AB (ref >60.00)
GLUCOSE: 101 mg/dL — AB (ref 70–99)
POTASSIUM: 4.4 meq/L (ref 3.5–5.1)
SODIUM: 141 meq/L (ref 135–145)
TOTAL PROTEIN: 6.7 g/dL (ref 6.0–8.3)
Total Bilirubin: 0.4 mg/dL (ref 0.2–1.2)

## 2016-06-05 LAB — CBC
HEMATOCRIT: 46 % (ref 36.0–46.0)
Hemoglobin: 14.9 g/dL (ref 12.0–15.0)
MCHC: 32.5 g/dL (ref 30.0–36.0)
MCV: 82.2 fl (ref 78.0–100.0)
Platelets: 315 10*3/uL (ref 150.0–400.0)
RBC: 5.59 Mil/uL — AB (ref 3.87–5.11)
RDW: 15.9 % — AB (ref 11.5–15.5)
WBC: 7.4 10*3/uL (ref 4.0–10.5)

## 2016-06-05 LAB — HEMOGLOBIN A1C: Hgb A1c MFr Bld: 6.1 % (ref 4.6–6.5)

## 2016-06-05 NOTE — Assessment & Plan Note (Signed)
Prevanar given at visit to update her immunizations. Checking labs. Referral to GI for colonoscopy. Needs dexa but with her cataracts as well she declines today. Hep c screening ordered. Counseled her on the need for exercise regularly. Given 10 year screening recommendations.

## 2016-06-05 NOTE — Assessment & Plan Note (Signed)
Cannot take statins, checking lipid panel and expect it to be high. We have discussed the adverse risk to her heart with high cholesterol and she will try to start exercising again.

## 2016-06-05 NOTE — Patient Instructions (Addendum)
We have given you the pneumonia booster shot today.   We will check the labs and get you in with the GI doctor about the colonoscopy.   Health Maintenance, Female Adopting a healthy lifestyle and getting preventive care can go a long way to promote health and wellness. Talk with your health care provider about what schedule of regular examinations is right for you. This is a good chance for you to check in with your provider about disease prevention and staying healthy. In between checkups, there are plenty of things you can do on your own. Experts have done a lot of research about which lifestyle changes and preventive measures are most likely to keep you healthy. Ask your health care provider for more information. WEIGHT AND DIET  Eat a healthy diet  Be sure to include plenty of vegetables, fruits, low-fat dairy products, and lean protein.  Do not eat a lot of foods high in solid fats, added sugars, or salt.  Get regular exercise. This is one of the most important things you can do for your health.  Most adults should exercise for at least 150 minutes each week. The exercise should increase your heart rate and make you sweat (moderate-intensity exercise).  Most adults should also do strengthening exercises at least twice a week. This is in addition to the moderate-intensity exercise.  Maintain a healthy weight  Body mass index (BMI) is a measurement that can be used to identify possible weight problems. It estimates body fat based on height and weight. Your health care provider can help determine your BMI and help you achieve or maintain a healthy weight.  For females 8 years of age and older:   A BMI below 18.5 is considered underweight.  A BMI of 18.5 to 24.9 is normal.  A BMI of 25 to 29.9 is considered overweight.  A BMI of 30 and above is considered obese.  Watch levels of cholesterol and blood lipids  You should start having your blood tested for lipids and cholesterol  at 72 years of age, then have this test every 5 years.  You may need to have your cholesterol levels checked more often if:  Your lipid or cholesterol levels are high.  You are older than 72 years of age.  You are at high risk for heart disease.  CANCER SCREENING   Lung Cancer  Lung cancer screening is recommended for adults 57-61 years old who are at high risk for lung cancer because of a history of smoking.  A yearly low-dose CT scan of the lungs is recommended for people who:  Currently smoke.  Have quit within the past 15 years.  Have at least a 30-pack-year history of smoking. A pack year is smoking an average of one pack of cigarettes a day for 1 year.  Yearly screening should continue until it has been 15 years since you quit.  Yearly screening should stop if you develop a health problem that would prevent you from having lung cancer treatment.  Breast Cancer  Practice breast self-awareness. This means understanding how your breasts normally appear and feel.  It also means doing regular breast self-exams. Let your health care provider know about any changes, no matter how small.  If you are in your 20s or 30s, you should have a clinical breast exam (CBE) by a health care provider every 1-3 years as part of a regular health exam.  If you are 8 or older, have a CBE every year. Also consider  having a breast X-ray (mammogram) every year.  If you have a family history of breast cancer, talk to your health care provider about genetic screening.  If you are at high risk for breast cancer, talk to your health care provider about having an MRI and a mammogram every year.  Breast cancer gene (BRCA) assessment is recommended for women who have family members with BRCA-related cancers. BRCA-related cancers include:  Breast.  Ovarian.  Tubal.  Peritoneal cancers.  Results of the assessment will determine the need for genetic counseling and BRCA1 and BRCA2  testing. Cervical Cancer Your health care provider may recommend that you be screened regularly for cancer of the pelvic organs (ovaries, uterus, and vagina). This screening involves a pelvic examination, including checking for microscopic changes to the surface of your cervix (Pap test). You may be encouraged to have this screening done every 3 years, beginning at age 39.  For women ages 2-65, health care providers may recommend pelvic exams and Pap testing every 3 years, or they may recommend the Pap and pelvic exam, combined with testing for human papilloma virus (HPV), every 5 years. Some types of HPV increase your risk of cervical cancer. Testing for HPV may also be done on women of any age with unclear Pap test results.  Other health care providers may not recommend any screening for nonpregnant women who are considered low risk for pelvic cancer and who do not have symptoms. Ask your health care provider if a screening pelvic exam is right for you.  If you have had past treatment for cervical cancer or a condition that could lead to cancer, you need Pap tests and screening for cancer for at least 20 years after your treatment. If Pap tests have been discontinued, your risk factors (such as having a new sexual partner) need to be reassessed to determine if screening should resume. Some women have medical problems that increase the chance of getting cervical cancer. In these cases, your health care provider may recommend more frequent screening and Pap tests. Colorectal Cancer  This type of cancer can be detected and often prevented.  Routine colorectal cancer screening usually begins at 72 years of age and continues through 72 years of age.  Your health care provider may recommend screening at an earlier age if you have risk factors for colon cancer.  Your health care provider may also recommend using home test kits to check for hidden blood in the stool.  A small camera at the end of a  tube can be used to examine your colon directly (sigmoidoscopy or colonoscopy). This is done to check for the earliest forms of colorectal cancer.  Routine screening usually begins at age 73.  Direct examination of the colon should be repeated every 5-10 years through 72 years of age. However, you may need to be screened more often if early forms of precancerous polyps or small growths are found. Skin Cancer  Check your skin from head to toe regularly.  Tell your health care provider about any new moles or changes in moles, especially if there is a change in a mole's shape or color.  Also tell your health care provider if you have a mole that is larger than the size of a pencil eraser.  Always use sunscreen. Apply sunscreen liberally and repeatedly throughout the day.  Protect yourself by wearing long sleeves, pants, a wide-brimmed hat, and sunglasses whenever you are outside. HEART DISEASE, DIABETES, AND HIGH BLOOD PRESSURE   High blood  pressure causes heart disease and increases the risk of stroke. High blood pressure is more likely to develop in:  People who have blood pressure in the high end of the normal range (130-139/85-89 mm Hg).  People who are overweight or obese.  People who are African American.  If you are 48-67 years of age, have your blood pressure checked every 3-5 years. If you are 48 years of age or older, have your blood pressure checked every year. You should have your blood pressure measured twice--once when you are at a hospital or clinic, and once when you are not at a hospital or clinic. Record the average of the two measurements. To check your blood pressure when you are not at a hospital or clinic, you can use:  An automated blood pressure machine at a pharmacy.  A home blood pressure monitor.  If you are between 85 years and 8 years old, ask your health care provider if you should take aspirin to prevent strokes.  Have regular diabetes screenings. This  involves taking a blood sample to check your fasting blood sugar level.  If you are at a normal weight and have a low risk for diabetes, have this test once every three years after 72 years of age.  If you are overweight and have a high risk for diabetes, consider being tested at a younger age or more often. PREVENTING INFECTION  Hepatitis B  If you have a higher risk for hepatitis B, you should be screened for this virus. You are considered at high risk for hepatitis B if:  You were born in a country where hepatitis B is common. Ask your health care provider which countries are considered high risk.  Your parents were born in a high-risk country, and you have not been immunized against hepatitis B (hepatitis B vaccine).  You have HIV or AIDS.  You use needles to inject street drugs.  You live with someone who has hepatitis B.  You have had sex with someone who has hepatitis B.  You get hemodialysis treatment.  You take certain medicines for conditions, including cancer, organ transplantation, and autoimmune conditions. Hepatitis C  Blood testing is recommended for:  Everyone born from [birth year] through 1967.  Anyone with known risk factors for hepatitis C. Sexually transmitted infections (STIs)  You should be screened for sexually transmitted infections (STIs) including gonorrhea and chlamydia if:  You are sexually active and are younger than 72 years of age.  You are older than 73 years of age and your health care provider tells you that you are at risk for this type of infection.  Your sexual activity has changed since you were last screened and you are at an increased risk for chlamydia or gonorrhea. Ask your health care provider if you are at risk.  If you do not have HIV, but are at risk, it may be recommended that you take a prescription medicine daily to prevent HIV infection. This is called pre-exposure prophylaxis (PrEP). You are considered at risk if:  You are  sexually active and do not regularly use condoms or know the HIV status of your partner(s).  You take drugs by injection.  You are sexually active with a partner who has HIV. Talk with your health care provider about whether you are at high risk of being infected with HIV. If you choose to begin PrEP, you should first be tested for HIV. You should then be tested every 3 months for as long as  you are taking PrEP.  PREGNANCY   If you are premenopausal and you may become pregnant, ask your health care provider about preconception counseling.  If you may become pregnant, take 400 to 800 micrograms (mcg) of folic acid every day.  If you want to prevent pregnancy, talk to your health care provider about birth control (contraception). OSTEOPOROSIS AND MENOPAUSE   Osteoporosis is a disease in which the bones lose minerals and strength with aging. This can result in serious bone fractures. Your risk for osteoporosis can be identified using a bone density scan.  If you are 21 years of age or older, or if you are at risk for osteoporosis and fractures, ask your health care provider if you should be screened.  Ask your health care provider whether you should take a calcium or vitamin D supplement to lower your risk for osteoporosis.  Menopause may have certain physical symptoms and risks.  Hormone replacement therapy may reduce some of these symptoms and risks. Talk to your health care provider about whether hormone replacement therapy is right for you.  HOME CARE INSTRUCTIONS   Schedule regular health, dental, and eye exams.  Stay current with your immunizations.   Do not use any tobacco products including cigarettes, chewing tobacco, or electronic cigarettes.  If you are pregnant, do not drink alcohol.  If you are breastfeeding, limit how much and how often you drink alcohol.  Limit alcohol intake to no more than 1 drink per day for nonpregnant women. One drink equals 12 ounces of beer, 5  ounces of wine, or 1 ounces of hard liquor.  Do not use street drugs.  Do not share needles.  Ask your health care provider for help if you need support or information about quitting drugs.  Tell your health care provider if you often feel depressed.  Tell your health care provider if you have ever been abused or do not feel safe at home.   This information is not intended to replace advice given to you by your health care provider. Make sure you discuss any questions you have with your health care provider.   Document Released: 06/02/2011 Document Revised: 12/08/2014 Document Reviewed: 10/19/2013 Elsevier Interactive Patient Education [employer].

## 2016-06-05 NOTE — Progress Notes (Signed)
   Subjective:    Patient ID: Wendy Patrick, female    DOB: 17-Apr-1946, 70 y.o.   MRN: [id number]  HPI Here for medicare wellness, no new complaints. Please see A/P for status and treatment of chronic medical problems.   Diet: heart healthy Physical activity: sedentary Depression/mood screen: negative Hearing: intact to whispered voice, some loss Visual acuity: grossly normal with lens, cataracts, performs annual eye exam  ADLs: capable Fall risk: none Home safety: good Cognitive evaluation: intact to orientation, naming, recall and repetition EOL planning: adv directives discussed, in place  I have personally reviewed and have noted 1. The patient's medical and social history - reviewed today no changes 2. Their use of alcohol, tobacco or illicit drugs 3. Their current medications and supplements 4. The patient's functional ability including ADL's, fall risks, home safety risks and hearing or visual impairment. 5. Diet and physical activities 6. Evidence for depression or mood disorders 7. Care team reviewed and updated (available in snapshot)  Review of Systems  Constitutional: Positive for activity change. Negative for appetite change, fatigue and unexpected weight change.       Not exercising  HENT: Positive for congestion. Negative for ear discharge, ear pain, nosebleeds, postnasal drip, rhinorrhea, sinus pressure, sore throat and trouble swallowing.   Eyes: Negative.   Respiratory: Positive for cough. Negative for chest tightness, shortness of breath and wheezing.   Cardiovascular: Negative.   Gastrointestinal: Negative.   Musculoskeletal: Positive for arthralgias.  Neurological: Negative.   Psychiatric/Behavioral: Negative.       Objective:   Physical Exam  Constitutional: She is oriented to person, place, and time. She appears well-developed and well-nourished.  HENT:  Head: Normocephalic and atraumatic.  Right Ear: External ear normal.  Left Ear: External ear  normal.  Oropharynx with mild erythema  Eyes: EOM are normal.  Neck: Normal range of motion. No JVD present.  Cardiovascular: Normal rate and regular rhythm.   Carotids without murmur bilaterally  Pulmonary/Chest: Effort normal and breath sounds normal. No respiratory distress. She has no wheezes.  Abdominal: Soft. Bowel sounds are normal. She exhibits no distension. There is no tenderness. There is no rebound.  Lymphadenopathy:    She has no cervical adenopathy.  Neurological: She is alert and oriented to person, place, and time. Coordination normal.  Skin: Skin is warm and dry.  Psychiatric: She has a normal mood and affect.   Filed Vitals:   06/05/16 0804  BP: 112/62  Pulse: 72  Temp: 97.8 F (36.6 C)  TempSrc: Oral  Resp: 16  Height: 5\' 4"  (1.626 m)  Weight: 136 lb (61.689 kg)  SpO2: 96%      Assessment & Plan:  Prevnar 13 given at visit.

## 2016-06-05 NOTE — Progress Notes (Signed)
Pre visit review using our clinic review tool, if applicable. No additional management support is needed unless otherwise documented below in the visit note. 

## 2016-06-05 NOTE — Assessment & Plan Note (Signed)
At this time resolved and MRI without signs of progression and prn follow up with neurology.

## 2016-06-05 NOTE — Assessment & Plan Note (Signed)
Had almost fully resolved but exacerbated by allergies and those are flared right now.

## 2016-06-06 LAB — HEPATITIS C ANTIBODY: HCV AB: NEGATIVE

## 2016-06-16 ENCOUNTER — Encounter: Payer: Self-pay | Admitting: Gastroenterology

## 2016-07-29 ENCOUNTER — Ambulatory Visit (AMBULATORY_SURGERY_CENTER): Payer: Self-pay

## 2016-07-29 VITALS — Ht 64.0 in | Wt 136.6 lb

## 2016-07-29 DIAGNOSIS — Z1211 Encounter for screening for malignant neoplasm of colon: Secondary | ICD-10-CM

## 2016-07-29 MED ORDER — SUPREP BOWEL PREP KIT 17.5-3.13-1.6 GM/177ML PO SOLN: 1.0000 | Freq: Once | ORAL | 0 refills | Status: AC

## 2016-07-29 NOTE — Progress Notes (Signed)
No allergies to eggs or soy No diet meds No home oxygen No past problems with anesthesia  Has email and internet; declined emmi 

## 2016-07-31 ENCOUNTER — Encounter: Payer: Self-pay | Admitting: Gastroenterology

## 2016-08-13 ENCOUNTER — Ambulatory Visit (AMBULATORY_SURGERY_CENTER): Payer: Medicare HMO | Admitting: Gastroenterology

## 2016-08-13 ENCOUNTER — Encounter: Payer: Self-pay | Admitting: Gastroenterology

## 2016-08-13 VITALS — BP 133/70 | HR 75 | Temp 98.6°F | Resp 15 | Ht 64.0 in | Wt 136.0 lb

## 2016-08-13 DIAGNOSIS — Z1211 Encounter for screening for malignant neoplasm of colon: Secondary | ICD-10-CM

## 2016-08-13 DIAGNOSIS — D125 Benign neoplasm of sigmoid colon: Secondary | ICD-10-CM | POA: Diagnosis not present

## 2016-08-13 DIAGNOSIS — D124 Benign neoplasm of descending colon: Secondary | ICD-10-CM

## 2016-08-13 DIAGNOSIS — K635 Polyp of colon: Secondary | ICD-10-CM | POA: Diagnosis not present

## 2016-08-13 DIAGNOSIS — E119 Type 2 diabetes mellitus without complications: Secondary | ICD-10-CM | POA: Diagnosis not present

## 2016-08-13 DIAGNOSIS — D123 Benign neoplasm of transverse colon: Secondary | ICD-10-CM | POA: Diagnosis not present

## 2016-08-13 DIAGNOSIS — J449 Chronic obstructive pulmonary disease, unspecified: Secondary | ICD-10-CM | POA: Diagnosis not present

## 2016-08-13 DIAGNOSIS — D122 Benign neoplasm of ascending colon: Secondary | ICD-10-CM | POA: Diagnosis not present

## 2016-08-13 DIAGNOSIS — I2510 Atherosclerotic heart disease of native coronary artery without angina pectoris: Secondary | ICD-10-CM | POA: Diagnosis not present

## 2016-08-13 MED ORDER — SODIUM CHLORIDE 0.9 % IV SOLN: 500.0000 mL | INTRAVENOUS | Status: DC

## 2016-08-13 NOTE — Progress Notes (Signed)
Called to room to assist during endoscopic procedure.  Patient ID and intended procedure confirmed with present staff. Received instructions for my participation in the procedure from the performing physician.  

## 2016-08-13 NOTE — Op Note (Signed)
[facility] Patient Name: Wendy Patrick Procedure Date: 08/13/2016 10:39 AM MRN: [id number] Endoscopist: Mauri Pole , MD Age: 70 Referring MD:  Date of Birth: 04/30/1946 Gender: Female Account #: [id number] Procedure:                Colonoscopy Indications:              Screening for colorectal malignant neoplasm, Last                            colonoscopy: date unknown (unable to locate last                            colonoscopy report) Medicines:                Monitored Anesthesia Care Procedure:                Pre-Anesthesia Assessment:                           - Prior to the procedure, a History and Physical                            was performed, and patient medications and                            allergies were reviewed. The patient's tolerance of                            previous anesthesia was also reviewed. The risks                            and benefits of the procedure and the sedation                            options and risks were discussed with the patient.                            All questions were answered, and informed consent                            was obtained. Prior Anticoagulants: The patient has                            taken no previous anticoagulant or antiplatelet                            agents. ASA Grade Assessment: II - A patient with                            mild systemic disease. After reviewing the risks                            and benefits, the patient was deemed in  satisfactory condition to undergo the procedure.                           After obtaining informed consent, the colonoscope                            was passed under direct vision. Throughout the                            procedure, the patient's blood pressure, pulse, and                            oxygen saturations were monitored continuously. The                            Model CF-HQ190L [id number]) scope was  introduced                            through the anus and advanced to the the terminal                            ileum, with identification of the appendiceal                            orifice and IC valve. The colonoscopy was performed                            without difficulty. The patient tolerated the                            procedure well. The quality of the bowel                            preparation was good. The terminal ileum, ileocecal                            valve, appendiceal orifice, and rectum were                            photographed. Scope In: 10:45:21 AM Scope Out: 11:02:46 AM Scope Withdrawal Time: 0 hours 14 minutes 12 seconds  Total Procedure Duration: 0 hours 17 minutes 25 seconds  Findings:                 The perianal and digital rectal examinations were                            normal.                           A 12 mm polyp was found in the transverse colon.                            The polyp was sessile. The polyp was removed with a  hot snare. Resection and retrieval were complete.                           A 3 mm polyp was found in the descending colon. The                            polyp was sessile. The polyp was removed with a                            cold biopsy forceps. Resection and retrieval were                            complete.                           A 5 mm polyp was found in the sigmoid colon. The                            polyp was sessile. The polyp was removed with a                            cold snare. Resection and retrieval were complete.                           Non-bleeding internal hemorrhoids were found during                            retroflexion. The hemorrhoids were medium-sized.                           The exam was otherwise without abnormality. Complications:            No immediate complications. Estimated Blood Loss:     Estimated blood loss was minimal. Impression:                - One 12 mm polyp in the transverse colon, removed                            with a hot snare. Resected and retrieved.                           - One 3 mm polyp in the descending colon, removed                            with a cold biopsy forceps. Resected and retrieved.                           - One 5 mm polyp in the sigmoid colon, removed with                            a cold snare. Resected and retrieved.                           -  Non-bleeding internal hemorrhoids.                           - The examination was otherwise normal. Recommendation:           - Patient has a contact number available for                            emergencies. The signs and symptoms of potential                            delayed complications were discussed with the                            patient. Return to normal activities tomorrow.                            Written discharge instructions were provided to the                            patient.                           - Resume previous diet.                           - Continue present medications.                           - Await pathology results.                           - Repeat colonoscopy in 3 years for surveillance.                           - Return to GI clinic PRN. Mauri Pole, MD 08/13/2016 11:08:45 AM This report has been signed electronically.

## 2016-08-13 NOTE — Progress Notes (Signed)
To recovery, report to Hodges, RN, VSS 

## 2016-08-13 NOTE — Patient Instructions (Signed)
YOU HAD AN ENDOSCOPIC PROCEDURE TODAY AT [facility]:   Refer to the procedure report that was given to you for any specific questions about what was found during the examination.  If the procedure report does not answer your questions, please call your gastroenterologist to clarify.  If you requested that your care partner not be given the details of your procedure findings, then the procedure report has been included in a sealed envelope for you to review at your convenience later.  YOU SHOULD EXPECT: Some feelings of bloating in the abdomen. Passage of more gas than usual.  Walking can help get rid of the air that was put into your GI tract during the procedure and reduce the bloating. If you had a lower endoscopy (such as a colonoscopy or flexible sigmoidoscopy) you may notice spotting of blood in your stool or on the toilet paper. If you underwent a bowel prep for your procedure, you may not have a normal bowel movement for a few days.  Please Note:  You might notice some irritation and congestion in your nose or some drainage.  This is from the oxygen used during your procedure.  There is no need for concern and it should clear up in a day or so.  SYMPTOMS TO REPORT IMMEDIATELY:   Following lower endoscopy (colonoscopy or flexible sigmoidoscopy):  Excessive amounts of blood in the stool  Significant tenderness or worsening of abdominal pains  Swelling of the abdomen that is new, acute  Fever of 100F or higher   For urgent or emergent issues, a gastroenterologist can be reached at any hour by calling [phone].   DIET:  We do recommend a small meal at first, but then you may proceed to your regular diet.  Drink plenty of fluids but you should avoid alcoholic beverages for 24 hours.  Try to increase the fiber in your diet, and drink plenty of water to help you hemorrhoids.  ACTIVITY:  You should plan to take it easy for the rest of today and you should NOT DRIVE or use  heavy machinery until tomorrow (because of the sedation medicines used during the test).    FOLLOW UP: Our staff will call the number listed on your records the next business day following your procedure to check on you and address any questions or concerns that you may have regarding the information given to you following your procedure. If we do not reach you, we will leave a message.  However, if you are feeling well and you are not experiencing any problems, there is no need to return our call.  We will assume that you have returned to your regular daily activities without incident.  If any biopsies were taken you will be contacted by phone or by letter within the next 1-3 weeks.  Please call us at [phone] if you have not heard about the biopsies in 3 weeks.    SIGNATURES/CONFIDENTIALITY: You and/or your care partner have signed paperwork which will be entered into your electronic medical record.  These signatures attest to the fact that that the information above on your After Visit Summary has been reviewed and is understood.  Full responsibility of the confidentiality of this discharge information lies with you and/or your care-partner.  Read all of the handouts given to you by your recovery room nurse.  Thank-you for choosing [place] for your healthcare needs today.

## 2016-08-14 ENCOUNTER — Telehealth: Payer: Self-pay

## 2016-08-14 NOTE — Telephone Encounter (Signed)
  Follow up Call-  Call back number 08/13/2016  Post procedure Call Back phone  # [phone]  Permission to leave phone message Yes  Some recent data might be hidden     Patient questions:  Do you have a fever, pain , or abdominal swelling? No. Pain Score  0 *  Have you tolerated food without any problems? Yes.    Have you been able to return to your normal activities? Yes.    Do you have any questions about your discharge instructions: Diet   No. Medications  No. Follow up visit  No.  Do you have questions or concerns about your Care? No.  Actions: * If pain score is 4 or above: No action needed, pain <4.

## 2016-08-18 ENCOUNTER — Encounter: Payer: Self-pay | Admitting: Gastroenterology

## 2016-09-15 ENCOUNTER — Other Ambulatory Visit: Payer: Self-pay | Admitting: Internal Medicine

## 2016-09-15 DIAGNOSIS — Z1231 Encounter for screening mammogram for malignant neoplasm of breast: Secondary | ICD-10-CM

## 2016-09-22 ENCOUNTER — Ambulatory Visit
Admission: RE | Admit: 2016-09-22 | Discharge: 2016-09-22 | Disposition: A | Discharge status: Home / Self Care | Payer: Medicare HMO | Source: Ambulatory Visit | Attending: Internal Medicine | Admitting: Internal Medicine

## 2016-09-22 DIAGNOSIS — Z1231 Encounter for screening mammogram for malignant neoplasm of breast: Secondary | ICD-10-CM | POA: Diagnosis not present

## 2016-09-30 ENCOUNTER — Ambulatory Visit (INDEPENDENT_AMBULATORY_CARE_PROVIDER_SITE_OTHER): Payer: Medicare HMO | Admitting: Internal Medicine

## 2016-09-30 ENCOUNTER — Other Ambulatory Visit: Payer: Medicare HMO

## 2016-09-30 ENCOUNTER — Ambulatory Visit (INDEPENDENT_AMBULATORY_CARE_PROVIDER_SITE_OTHER)
Admission: RE | Admit: 2016-09-30 | Discharge: 2016-09-30 | Disposition: A | Discharge status: Home / Self Care | Payer: Medicare HMO | Source: Ambulatory Visit | Attending: Internal Medicine | Admitting: Internal Medicine

## 2016-09-30 ENCOUNTER — Encounter: Payer: Self-pay | Admitting: Internal Medicine

## 2016-09-30 VITALS — BP 132/80 | HR 89 | Temp 97.7°F | Wt 135.0 lb

## 2016-09-30 DIAGNOSIS — R682 Dry mouth, unspecified: Secondary | ICD-10-CM

## 2016-09-30 DIAGNOSIS — R059 Cough, unspecified: Secondary | ICD-10-CM

## 2016-09-30 DIAGNOSIS — J440 Chronic obstructive pulmonary disease with acute lower respiratory infection: Secondary | ICD-10-CM | POA: Diagnosis not present

## 2016-09-30 DIAGNOSIS — R05 Cough: Secondary | ICD-10-CM

## 2016-09-30 MED ORDER — PREDNISONE 20 MG PO TABS: 40.0000 mg | ORAL_TABLET | Freq: Every day | ORAL | 0 refills | Status: DC

## 2016-09-30 NOTE — Progress Notes (Signed)
Pre visit review using our clinic review tool, if applicable. No additional management support is needed unless otherwise documented below in the visit note. 

## 2016-09-30 NOTE — Patient Instructions (Addendum)
You likely have a viral infection which is worsening the cough.   We are checking a chest x-ray and you can keep doing the rinses and delsym.  We have sent in prednisone which you will take 2 pills daily for 6 days.   We are also checking labs for the sjogrens.

## 2016-10-01 LAB — ANA: ANA: NEGATIVE

## 2016-10-01 LAB — SJOGRENS SYNDROME-B EXTRACTABLE NUCLEAR ANTIBODY: SSB (La) (ENA) Antibody, IgG: <1

## 2016-10-01 LAB — SJOGRENS SYNDROME-A EXTRACTABLE NUCLEAR ANTIBODY: SSA (RO) (ENA) ANTIBODY, IGG: NEGATIVE

## 2016-10-03 NOTE — Progress Notes (Signed)
   Subjective:    Patient ID: Wendy Patrick, female    DOB: 12-28-1945, 70 y.o.   MRN: [id number]  HPI The patient is a 70 YO female coming in for cough which is ongoing over the last several years. She has had worsening for about 2-3 weeks with viral illness. She is feeling some better from that but still coughing a lot and some SOB with exertion. She denies fevers or chills. She is using sinus rinses and delsym at home which is helping some.  She is also having some dental dryness still and someone suggested that she be checked for sjogren's and she would like to have that.   Review of Systems  Constitutional: Positive for activity change. Negative for appetite change, chills, fatigue, fever and unexpected weight change.  HENT: Positive for congestion, postnasal drip, rhinorrhea and sore throat. Negative for ear discharge, ear pain, nosebleeds, sinus pressure and trouble swallowing.        Dry mouth  Eyes: Negative.   Respiratory: Positive for cough and shortness of breath. Negative for chest tightness and wheezing.   Cardiovascular: Negative.   Gastrointestinal: Negative.   Musculoskeletal: Negative.   Skin: Negative.       Objective:   Physical Exam  Constitutional: She is oriented to person, place, and time. She appears well-developed and well-nourished.  HENT:  Head: Normocephalic and atraumatic.  Right Ear: External ear normal.  Left Ear: External ear normal.  Oropharynx with redness and clear drainage.   Eyes: EOM are normal.  Neck: Normal range of motion.  Cardiovascular: Normal rate and regular rhythm.   Pulmonary/Chest: Effort normal. No respiratory distress. She has wheezes. She has no rales. She exhibits no tenderness.  Some coarse wheezing which partially clears with cough.  Abdominal: Soft. She exhibits no distension. There is no tenderness. There is no rebound.  Lymphadenopathy:    She has no cervical adenopathy.  Neurological: She is alert and oriented to person,  place, and time.  Skin: Skin is warm and dry.   Vitals:   09/30/16 1339  BP: 132/80  Pulse: 89  Temp: 97.7 F (36.5 C)  SpO2: 97%  Weight: 135 lb (61.2 kg)      Assessment & Plan:

## 2016-10-03 NOTE — Assessment & Plan Note (Addendum)
With flare today and will treat with prednisone for 6 days. She is not able to take allergy medication and flonase was ineffective so will have her continue with rinses and delsym. Does have albuterol at home which she knows to use when needed. Checking CXR, if any signs of infection will add antibiotics as well.

## 2016-10-20 DIAGNOSIS — Z961 Presence of intraocular lens: Secondary | ICD-10-CM | POA: Diagnosis not present

## 2016-10-20 DIAGNOSIS — H40013 Open angle with borderline findings, low risk, bilateral: Secondary | ICD-10-CM | POA: Diagnosis not present

## 2016-10-20 DIAGNOSIS — H469 Unspecified optic neuritis: Secondary | ICD-10-CM | POA: Diagnosis not present

## 2016-10-20 DIAGNOSIS — H2512 Age-related nuclear cataract, left eye: Secondary | ICD-10-CM | POA: Diagnosis not present

## 2016-10-20 DIAGNOSIS — H10413 Chronic giant papillary conjunctivitis, bilateral: Secondary | ICD-10-CM | POA: Diagnosis not present

## 2016-11-10 DIAGNOSIS — H2512 Age-related nuclear cataract, left eye: Secondary | ICD-10-CM | POA: Diagnosis not present

## 2016-11-13 DIAGNOSIS — H2512 Age-related nuclear cataract, left eye: Secondary | ICD-10-CM | POA: Diagnosis not present

## 2017-04-08 ENCOUNTER — Encounter: Payer: Self-pay | Admitting: Cardiology

## 2017-04-13 DIAGNOSIS — H01025 Squamous blepharitis left lower eyelid: Secondary | ICD-10-CM | POA: Diagnosis not present

## 2017-04-13 DIAGNOSIS — Z961 Presence of intraocular lens: Secondary | ICD-10-CM | POA: Diagnosis not present

## 2017-04-13 DIAGNOSIS — H01022 Squamous blepharitis right lower eyelid: Secondary | ICD-10-CM | POA: Diagnosis not present

## 2017-04-13 DIAGNOSIS — H01024 Squamous blepharitis left upper eyelid: Secondary | ICD-10-CM | POA: Diagnosis not present

## 2017-04-13 DIAGNOSIS — H10413 Chronic giant papillary conjunctivitis, bilateral: Secondary | ICD-10-CM | POA: Diagnosis not present

## 2017-04-13 DIAGNOSIS — H40013 Open angle with borderline findings, low risk, bilateral: Secondary | ICD-10-CM | POA: Diagnosis not present

## 2017-04-13 DIAGNOSIS — H469 Unspecified optic neuritis: Secondary | ICD-10-CM | POA: Diagnosis not present

## 2017-04-13 DIAGNOSIS — H01021 Squamous blepharitis right upper eyelid: Secondary | ICD-10-CM | POA: Diagnosis not present

## 2017-04-29 ENCOUNTER — Encounter: Payer: Self-pay | Admitting: Cardiology

## 2017-04-29 ENCOUNTER — Encounter (INDEPENDENT_AMBULATORY_CARE_PROVIDER_SITE_OTHER): Payer: Self-pay

## 2017-04-29 ENCOUNTER — Telehealth (HOSPITAL_COMMUNITY): Payer: Self-pay | Admitting: *Deleted

## 2017-04-29 ENCOUNTER — Ambulatory Visit (INDEPENDENT_AMBULATORY_CARE_PROVIDER_SITE_OTHER): Payer: Medicare HMO | Admitting: Cardiology

## 2017-04-29 VITALS — BP 130/70 | HR 73 | Ht 64.0 in | Wt 132.0 lb

## 2017-04-29 DIAGNOSIS — E7800 Pure hypercholesterolemia, unspecified: Secondary | ICD-10-CM | POA: Diagnosis not present

## 2017-04-29 DIAGNOSIS — R079 Chest pain, unspecified: Secondary | ICD-10-CM | POA: Diagnosis not present

## 2017-04-29 DIAGNOSIS — I2510 Atherosclerotic heart disease of native coronary artery without angina pectoris: Secondary | ICD-10-CM | POA: Diagnosis not present

## 2017-04-29 NOTE — Patient Instructions (Signed)
Medication Instructions:  Your physician recommends that you continue on your current medications as directed. Please refer to the Current Medication list given to you today.   Labwork: You will have FASTING LABS the same day as your stress test. Please do not eat after midnight prior to your appointment time.  Testing/Procedures: Dr. Radford Pax recommends you have a NUCLEAR STRESS TEST.  Follow-Up: Your physician wants you to follow-up in: 1 year with Dr. Radford Pax. You will receive a reminder letter in the mail two months in advance. If you don't receive a letter, please call our office to schedule the follow-up appointment.   Any Other Special Instructions Will Be Listed Below (If Applicable).     If you need a refill on your cardiac medications before your next appointment, please call your pharmacy.

## 2017-04-29 NOTE — Progress Notes (Signed)
Cardiology Office Note    Date:  04/29/2017   ID:  Wendy Patrick, Wendy Patrick [birth year], MRN [id number]  PCP:  Hoyt Koch, MD  Cardiologist:  Fransico Him, MD   Chief Complaint  Patient presents with  . Coronary Artery Disease  . Hyperlipidemia    History of Present Illness:  Wendy Patrick is a 71 y.o. 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  She has not tolerated statin drugs including pravastatin/lipitor/simvastatin and crestor due to muscle aches.  She is here today for followup and is doing well.  She has had discomfort in her chest that she thinks is indigestion.  She says if she takes antacids and it completely resolves. She has not had any significant SOB except when her allergies flare and she starts to cough.  She denies any significant DOE (except with extreme exertion from her COPD).  She denies any PND, orthopnea,  LE edema, palpitations or syncope.  She says that she has had some left arm pain in the bone and gets weak that she thinks is associated with the discomfort in her chest but is not completely sure of that.     Past Medical History:  Diagnosis Date  . COPD (chronic obstructive pulmonary disease) ([place])   . Coronary artery disease 08/2009   40% mid left circ by cath in [place], [place]  . Diabetes mellitus without complication ([place])    gestational  . Endometriosis    1986  . Gestational diabetes    age 71  . Granuloma annulare   . Hyperlipidemia   . Optic neuritis    2015  . Optic neuritis Novembr  . Vaginal anomaly    tears and bleeds easily    Past Surgical History:  Procedure Laterality Date  . ABDOMINAL HYSTERECTOMY    . CARDIAC CATHETERIZATION    . COLONOSCOPY    . EYE SURGERY    . MANDIBLE SURGERY    . MINOR HEMORRHOIDECTOMY    . TUBAL LIGATION    . UPPER GASTROINTESTINAL ENDOSCOPY      Current Medications: Current Meds  Medication Sig  . aspirin 81 MG tablet Take 81 mg by mouth daily.    . Chlorpheniramine Maleate (ALLERGY PO) Take 10 mg by mouth daily.  . Omega-3 Fatty Acids (FISH OIL) 500 MG CAPS Take 500 mg by mouth daily.    Allergies:   Statins; Crestor [rosuvastatin]; and Latex   Social History   Social History  . Marital status: Married    Spouse name: N/A  . Number of children: N/A  . Years of education: N/A   Social History Main Topics  . Smoking status: Former Smoker    Packs/day: 2.00    Years: 50.00    Types: Cigarettes    Quit date: 02/05/2000  . Smokeless tobacco: Never Used     Comment: Educated LDCT for lung Ca to discuss with MD if interested  . Alcohol use 1.8 oz/week    3 Glasses of wine per week     Comment: wine with dinner   . Drug use: No  . Sexual activity: No   Other Topics Concern  . None   Social History Narrative  . None     Family History:  The patient's family history includes Cancer in her mother; Stroke in her brother.   ROS:   Please see the history of present illness.    Review of Systems  HENT:  Positive for hearing loss.   Neurological: Positive for loss of balance.   All other systems reviewed and are negative.  No flowsheet data found.     PHYSICAL EXAM:   VS:  BP 130/70   Pulse 73   Ht 5\' 4"  (1.626 m)   Wt 132 lb (59.9 kg)   SpO2 98%   BMI 22.66 kg/m    GEN: Well nourished, well developed, in no acute distress  HEENT: normal  Neck: no JVD, carotid bruits, or masses Cardiac: RRR; no murmurs, rubs, or gallops,no edema.  Intact distal pulses bilaterally.  Respiratory:  clear to auscultation bilaterally, normal work of breathing GI: soft, nontender, nondistended, + BS MS: no deformity or atrophy  Skin: warm and dry, no rash Neuro:  Alert and Oriented x 3, Strength and sensation are intact Psych: euthymic mood, full affect  Wt Readings from Last 3 Encounters:  04/29/17 132 lb (59.9 kg)  09/30/16 135 lb (61.2 kg)  08/13/16 136 lb (61.7 kg)      Studies/Labs Reviewed:   EKG:  EKG is ordered  today.  The ekg ordered today demonstrates NSR at 73bpm with no ST changes  Recent Labs: 06/05/2016: ALT 16; BUN 14; Creatinine, Ser 1.06; Hemoglobin 14.9; Platelets 315.0; Potassium 4.4; Sodium 141   Lipid Panel    Component Value Date/Time   CHOL 266 (H) 06/05/2016 0843   TRIG 179.0 (H) 06/05/2016 0843   HDL 64.10 06/05/2016 0843   CHOLHDL 4 06/05/2016 0843   VLDL 35.8 06/05/2016 0843   LDLCALC 166 (H) 06/05/2016 0843    Additional studies/ records that were reviewed today include:  none    ASSESSMENT:    1. Coronary artery disease involving native coronary artery of native heart without angina pectoris   2. Pure hypercholesterolemia   3. Chest pain, unspecified type      PLAN:  In order of problems listed above:  1.  Nonobstructive ASCAD with 40% RCA by remote cath.  She has has some problems with occasional atypical chest discomfort.  It is left sided as a deep ache with some pain in her left arm at the same time but resolves with antacids.  She is statin intolerant and her LDL has been very high in the past.  Her EKG today is nonischemic.  Given her known CAD and high LDL on no statin due to intolerance she may have progression of CAD.  I have recommended that we get a stress myoview to rule out ischemia.  She will continue on ASA 81mg  daily.   2.  Hyperlipidemia with LDL goal < 70.  She is statin intolerant.  Her last LDL was 166.  I will repeat an FLP and ALT and if elevated will refer to lipid clniic to consider PCSK9 drug.  3.  Chest pain - somewhat atypical but with associated left arm pain - nuclear stress test as stated in #1.   Medication Adjustments/Labs and Tests Ordered: Current medicines are reviewed at length with the patient today.  Concerns regarding medicines are outlined above.  Medication changes, Labs and Tests ordered today are listed in the Patient Instructions below.  There are no Patient Instructions on file for this visit.   Signed, Fransico Him, MD  04/29/2017 10:04 AM    [address], [place], [place]  [address] Phone: [phone]; Fax: [phone]

## 2017-04-29 NOTE — Telephone Encounter (Signed)
Patient given detailed instructions per Myocardial Perfusion Study Information Sheet for the test on 05/04/17 at 0730. Patient notified to arrive 15 minutes early and that it is imperative to arrive on time for appointment to keep from having the test rescheduled.  If you need to cancel or reschedule your appointment, please call the office within 24 hours of your appointment. . Patient verbalized understanding.Wendy Patrick, Ranae [place]

## 2017-05-04 ENCOUNTER — Other Ambulatory Visit: Payer: Medicare HMO | Admitting: *Deleted

## 2017-05-04 ENCOUNTER — Ambulatory Visit (HOSPITAL_COMMUNITY): Payer: Medicare HMO | Attending: Cardiovascular Disease

## 2017-05-04 DIAGNOSIS — R079 Chest pain, unspecified: Secondary | ICD-10-CM

## 2017-05-04 DIAGNOSIS — E7800 Pure hypercholesterolemia, unspecified: Secondary | ICD-10-CM | POA: Diagnosis not present

## 2017-05-04 LAB — HEPATIC FUNCTION PANEL
ALBUMIN: 3.9 g/dL (ref 3.5–4.8)
ALT: 12 IU/L (ref 0–32)
AST: 22 IU/L (ref 0–40)
Alkaline Phosphatase: 72 IU/L (ref 39–117)
Bilirubin Total: 0.2 mg/dL (ref 0.0–1.2)
Bilirubin, Direct: 0.07 mg/dL (ref 0.00–0.40)
Total Protein: 5.9 g/dL — ABNORMAL LOW (ref 6.0–8.5)

## 2017-05-04 LAB — LIPID PANEL
CHOL/HDL RATIO: 4.5 ratio — AB (ref 0.0–4.4)
Cholesterol, Total: 270 mg/dL — ABNORMAL HIGH (ref 100–199)
HDL: 60 mg/dL (ref >39)
LDL CALC: 178 mg/dL — AB (ref 0–99)
TRIGLYCERIDES: 158 mg/dL — AB (ref 0–149)
VLDL CHOLESTEROL CAL: 32 mg/dL (ref 5–40)

## 2017-05-04 LAB — MYOCARDIAL PERFUSION IMAGING
CHL CUP NUCLEAR SDS: 4
CSEPEDS: 30 s
CSEPHR: 101 %
Estimated workload: 6.4 METS
Exercise duration (min): 4 min
LVDIAVOL: 48 mL (ref 46–106)
LVSYSVOL: 17 mL
MPHR: 148 {beats}/min
Peak HR: 150 {beats}/min
RATE: 0.31
Rest HR: 69 {beats}/min
SRS: 1
SSS: 5
TID: 0.96

## 2017-05-04 IMAGING — NM NM MISC PROCEDURE
9 series · 54 of 54 positions shown · non-contrast
Comparison: none

[Series 1: wbr_s-proj_st stress-sum-em · 6.40mm/px · 6 of 64 frames shown]
[frame 6/64]
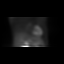
[frame 16/64]
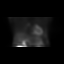
[frame 27/64]
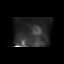
[frame 38/64]
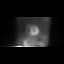
[frame 48/64]
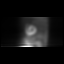
[frame 59/64]
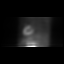

[Series 1: rest_(id)_sa · 6.4mm · 6.40mm/px · 6 of 64 frames shown]
[frame 6/64]
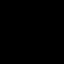
[frame 16/64]
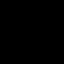
[frame 27/64]
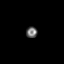
[frame 38/64]
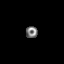
[frame 48/64]
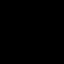
[frame 59/64]
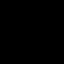

[Series 1: wbr_s-proj_st stress-gsp · 6.40mm/px · 6 of 512 frames shown]
[frame 43/512]
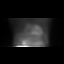
[frame 128/512]
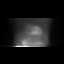
[frame 214/512]
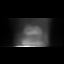
[frame 299/512]
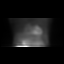
[frame 384/512]
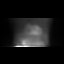
[frame 470/512]
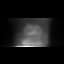

[Series 1: stress-sum-em_(id)_sa · 6.4mm · 6.40mm/px · 6 of 64 frames shown]
[frame 6/64]
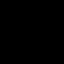
[frame 16/64]
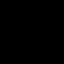
[frame 27/64]
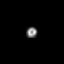
[frame 38/64]
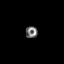
[frame 48/64]
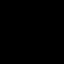
[frame 59/64]
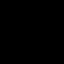

[Series 1: stress-gsp_(id)_sa · 6.4mm · 6.40mm/px · 6 of 512 frames shown]
[frame 43/512]
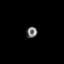
[frame 128/512]
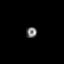
[frame 214/512]
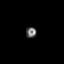
[frame 299/512]
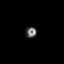
[frame 384/512]
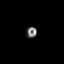
[frame 470/512]
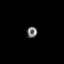

[Series 1: stress-sum-em · 6.40mm/px · 6 of 64 frames shown]
[frame 6/64]
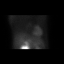
[frame 16/64]
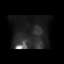
[frame 27/64]
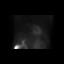
[frame 38/64]
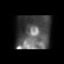
[frame 48/64]
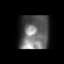
[frame 59/64]
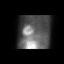

[Series 1: rest · 6.40mm/px · 6 of 64 frames shown]
[frame 6/64]
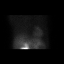
[frame 16/64]
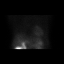
[frame 27/64]
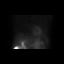
[frame 38/64]
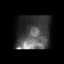
[frame 48/64]
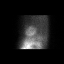
[frame 59/64]
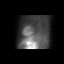

[Series 1: stress-gsp · 6.40mm/px · 6 of 512 frames shown]
[frame 43/512]
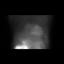
[frame 128/512]
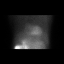
[frame 214/512]
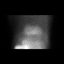
[frame 299/512]
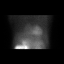
[frame 384/512]
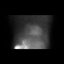
[frame 470/512]
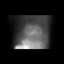

[Series 1: wbr_r-proj_st rest · 6.40mm/px · 6 of 64 frames shown]
[frame 6/64]
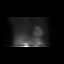
[frame 16/64]
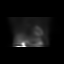
[frame 27/64]
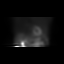
[frame 38/64]
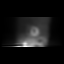
[frame 48/64]
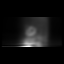
[frame 59/64]
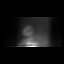

[54 of 54 positions shown; findings below may reference images not displayed]

Canned report from images found in remote index.

Refer to host system for actual result text.

## 2017-05-04 MED ORDER — TECHNETIUM TC 99M TETROFOSMIN IV KIT
10.7000 | PACK | Freq: Once | INTRAVENOUS | Status: AC | PRN
  Administered 2017-05-04: 10.7 via INTRAVENOUS
  Filled 2017-05-04: qty 11

## 2017-05-04 MED ORDER — TECHNETIUM TC 99M TETROFOSMIN IV KIT
32.9000 | PACK | Freq: Once | INTRAVENOUS | Status: AC | PRN
  Administered 2017-05-04: 32.9 via INTRAVENOUS
  Filled 2017-05-04: qty 33

## 2017-05-19 ENCOUNTER — Telehealth: Payer: Self-pay

## 2017-05-19 NOTE — Telephone Encounter (Signed)
Pt has an appt scheduled with Sharlet Salina but may want to see Sharee Pimple instead. We do not know when PCP will return.

## 2017-05-20 ENCOUNTER — Ambulatory Visit (INDEPENDENT_AMBULATORY_CARE_PROVIDER_SITE_OTHER): Payer: Medicare HMO | Admitting: Pharmacist

## 2017-05-20 VITALS — Wt 132.0 lb

## 2017-05-20 DIAGNOSIS — E7800 Pure hypercholesterolemia, unspecified: Secondary | ICD-10-CM

## 2017-05-20 NOTE — Patient Instructions (Addendum)
Focus on lifestyle changes - start taking the stairs more   Cut back on pork in your diet  Recheck labs in August with your primary care visit  Please strongly consider trying a medicine for your cholesterol since your LDL is 100 points is higher than we would like it to be (goal < 70). We would like to start Zetia 10mg  daily if you are willing.

## 2017-05-20 NOTE — Progress Notes (Signed)
Patient ID: Wendy Patrick                 DOB: [birth year]                    MRN: [id number]     HPI: Wendy Patrick is a 71 y.o. female patient referred to lipid clinic by Dr Radford Pax. PMH is significant for COPD, nonobstructive ASCVD with 40% RCA by cath and HLD. She has a history of statin intolerance to Crestor, Lipitor, and simvastatin. 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 Because of this, she may be more of a hyperabsorber and respond well to lipid-lowering agents that work in the gut.  Pt reports feeling ok overall. She had a tooth extraction in February that caused pain after. This affected her eating and she lost 10 lbs after. Over the past month, she has been able to eat like normal again. She would like to start the [place] diet again with her husband. States she recently started this and has been eating more vegetables which has caused her to lose 5 lbs.  Pt states her husband's medical bills are expensive > $300 per month. She is concerned with the cost of medication and does not think she can afford to take any more medications. She has a strong preference for making lifestyle changes to lower her cholesterol and is not open to trying any medication. She has previously taken multiple statins and experienced myalgias, however she has not tried Zetia or PCSK9i. Given pt's ApoE3/E4 mutation, she would likely respond well to Zetia. Discussed this with pt. When medications were mentioned to pt, she started listing reasons why she did not want to try any, including other medical problems like optic neuritis, a cough that she has had for 2 years, allergies, and balance problems. She again states that cost of medications would be prohibitive, but does not want an rx sent to her pharmacy to even check on the price of Zetia.  Current Medications: fish oil 500mg  daily Intolerances: Crestor 5-20mg  daily, Lipitor, simvastatin -  myalgias, Metamucil - gas Risk Factors: CAD, age, former smoker LDL goal: 70mg /dL  Diet: Likes yogurt and fresh fruit. Eats [place] burgers. Is working on cutting back on pork.  Exercise: Line dances once a week. Lives on the 3rd floor and wants to start taking the stairs a instead of the elevator.  Family History: 1 brother with a PCI at 57 and another brother with CABG in his 68s and stroke.   Social History: Pt has previously followed the [employer]. She had cut out all snack foods and lost 20 lbs since June. Quit smoking 15 years ago.  Labs: 05/04/17: TC 270, TG 158, HDL 60, LDL 178 (fish oil 500mg  daily)  Past Medical History:  Diagnosis Date  . COPD (chronic obstructive pulmonary disease) ([facility])   . Coronary artery disease 08/2009   40% mid left circ by cath in [place], [place]  . Diabetes mellitus without complication ([facility])    gestational  . Endometriosis    1986  . Gestational diabetes    age 51  . Granuloma annulare   . Hyperlipidemia   . Optic neuritis    2015  . Optic neuritis Novembr  . Vaginal anomaly    tears and bleeds easily    Current Outpatient Prescriptions on File Prior to Visit  Medication Sig Dispense Refill  . aspirin 81 MG  tablet Take 81 mg by mouth daily.    . Chlorpheniramine Maleate (ALLERGY PO) Take 10 mg by mouth daily.    . Omega-3 Fatty Acids (FISH OIL) 500 MG CAPS Take 500 mg by mouth daily.     No current facility-administered medications on file prior to visit.     Allergies  Allergen Reactions  . Statins     Leg cramps  . Crestor [Rosuvastatin] Other (See Comments)    Leg cramps  . Latex Rash    Assessment/Plan:  1. Hyperlipidemia - LDL is currently 178mg /dL, far above goal < 70mg /dL given CAD. Pt is unwilling to take any medications for her cholesterol because she would like to focus on lifestyle changes including taking the stairs more and eating less pork. Had a lengthy discussion with pt that this will not bring LDL close to  goal but she remains resistant to starting any medications. Ideally would start pt on Zetia 10mg  daily given multiple statin intolerances and ApoE3/E4 mutation which may result in more efficacious LDL lowering with medications that work in the gut. She states cost as a barrier to medications but refuses to have rx for Zetia sent to pharmacy to check on the price. She also lists many other chronic issues such as dizziness, cough, and allergies as reason that she does not want to take any cholesterol medication. Spent close to an hour with pt attempting to convince her to try Zetia for her cholesterol but was unsuccessful. PCP will check lipids in 3 months to assess efficacy of lifestyle modifications. Will bring up Zetia as an option at that time.   Kalid Ghan E. Monterio Bob, PharmD, CPP, [facility] [address], [place], [place] [address] Phone: [phone]; Fax: [phone] 05/20/2017 2:11 PM

## 2017-06-08 ENCOUNTER — Encounter: Payer: Medicare HMO | Admitting: Internal Medicine

## 2017-08-17 ENCOUNTER — Encounter: Payer: Self-pay | Admitting: Internal Medicine

## 2017-08-17 ENCOUNTER — Ambulatory Visit (INDEPENDENT_AMBULATORY_CARE_PROVIDER_SITE_OTHER): Payer: Medicare HMO | Admitting: Internal Medicine

## 2017-08-17 ENCOUNTER — Other Ambulatory Visit (INDEPENDENT_AMBULATORY_CARE_PROVIDER_SITE_OTHER): Payer: Medicare HMO

## 2017-08-17 VITALS — BP 130/70 | HR 74 | Temp 97.8°F | Ht 64.0 in | Wt 133.0 lb

## 2017-08-17 DIAGNOSIS — R1011 Right upper quadrant pain: Secondary | ICD-10-CM

## 2017-08-17 DIAGNOSIS — R05 Cough: Secondary | ICD-10-CM | POA: Diagnosis not present

## 2017-08-17 DIAGNOSIS — R058 Other specified cough: Secondary | ICD-10-CM

## 2017-08-17 DIAGNOSIS — H469 Unspecified optic neuritis: Secondary | ICD-10-CM | POA: Diagnosis not present

## 2017-08-17 LAB — CBC
HEMATOCRIT: 45.9 % (ref 36.0–46.0)
Hemoglobin: 14.9 g/dL (ref 12.0–15.0)
MCHC: 32.4 g/dL (ref 30.0–36.0)
MCV: 85.4 fl (ref 78.0–100.0)
Platelets: 288 10*3/uL (ref 150.0–400.0)
RBC: 5.38 Mil/uL — ABNORMAL HIGH (ref 3.87–5.11)
RDW: 14.3 % (ref 11.5–15.5)
WBC: 5.7 10*3/uL (ref 4.0–10.5)

## 2017-08-17 LAB — COMPREHENSIVE METABOLIC PANEL
ALT: 13 U/L (ref 0–35)
AST: 19 U/L (ref 0–37)
Albumin: 4 g/dL (ref 3.5–5.2)
Alkaline Phosphatase: 64 U/L (ref 39–117)
BILIRUBIN TOTAL: 0.4 mg/dL (ref 0.2–1.2)
BUN: 16 mg/dL (ref 6–23)
CO2: 28 meq/L (ref 19–32)
CREATININE: 0.99 mg/dL (ref 0.40–1.20)
Calcium: 9.3 mg/dL (ref 8.4–10.5)
Chloride: 106 mEq/L (ref 96–112)
GFR: 58.44 mL/min — ABNORMAL LOW (ref >60.00)
GLUCOSE: 95 mg/dL (ref 70–99)
Potassium: 4 mEq/L (ref 3.5–5.1)
Sodium: 142 mEq/L (ref 135–145)
Total Protein: 6.6 g/dL (ref 6.0–8.3)

## 2017-08-17 LAB — TSH: TSH: 1.79 u[IU]/mL (ref 0.35–4.50)

## 2017-08-17 LAB — VITAMIN D 25 HYDROXY (VIT D DEFICIENCY, FRACTURES): VITD: 24.34 ng/mL — AB (ref 30.00–100.00)

## 2017-08-17 LAB — VITAMIN B12: VITAMIN B 12: 397 pg/mL (ref 211–911)

## 2017-08-17 LAB — MAGNESIUM: MAGNESIUM: 2.3 mg/dL (ref 1.5–2.5)

## 2017-08-17 NOTE — Assessment & Plan Note (Signed)
She is having worsening of this or possibly a new problem. Offered neurology referral today but she wants to follow up with her eye specialist to see if they think it is necessary as money is tight right now. This is impairing her vision at this time.

## 2017-08-17 NOTE — Assessment & Plan Note (Signed)
Checking labs for cause as well as US abdomen to rule out gallstones. She has not had imaging of her stomach recently. She is asked to reduce the fat in her diet to see if this helps. She declines need for medication at this time.

## 2017-08-17 NOTE — Assessment & Plan Note (Signed)
She is having worsening lately due to allergies and she feels unable to take anything except chlorpheniramine which makes her drowsy. To keep the cough gone she needs to be on maintenance for allergies and discussed that with her today. She declines at this time and will just cough. Prior CXR without findings and non-smoker. Declines low dose CT screening (past smoker). No weight change, night sweats, blood in sputum.

## 2017-08-17 NOTE — Patient Instructions (Signed)
We are checking the ultrasound of the stomach when you are able to look at the stomach pain.  We are checking the labs today and will call you back about the results.   Think about getting a flu shot before December.

## 2017-08-17 NOTE — Progress Notes (Signed)
   Subjective:    Patient ID: Wendy Patrick, female    DOB: 08/13/1946, 71 y.o.   MRN: [id number]  HPI Here for medicare wellness and physical, but due to new problems this was postponed. She is having new RUQ pain (going on for several years intermittent but more persistent in the last 6 months, sometimes worse with eating, nothing makes it better, 6/10 at worst, dull pain in the RUQ which comes and goes, lasts an hour or so when present) as well as her cough (ongoing and having drainage as well, uses chlorpheniramine which is the only thing she thinks has helped with drainage but makes her drowsy so she can only take sometimes, denies GERD symptoms but does sometimes have with eating, not taking anything for GERD) as well as her vision (she is having vision changes, saw the eye doctor several months ago and they think there could be another problem with the vision, previously with optic neuritis, she cannot recall the name of what they thought it was, some blurring of objects and problems with focusing, no double vision, this is affecting her QOL, not having headaches lately). No other new concerns.   Review of Systems  Constitutional: Positive for activity change. Negative for appetite change, chills, fatigue, fever and unexpected weight change.  HENT: Positive for congestion, postnasal drip and rhinorrhea. Negative for sinus pain, sinus pressure, sneezing, sore throat, trouble swallowing and voice change.   Eyes: Negative.   Respiratory: Positive for cough. Negative for chest tightness, shortness of breath and wheezing.   Cardiovascular: Negative.   Gastrointestinal: Positive for abdominal distention and abdominal pain. Negative for anal bleeding, blood in stool, constipation, diarrhea, nausea and vomiting.       Mild distention with the pain sometimes.   Musculoskeletal: Negative for arthralgias, back pain, gait problem and myalgias.  Skin: Negative.   Neurological: Negative for dizziness,  seizures, weakness, numbness and headaches.  Psychiatric/Behavioral: Negative.       Objective:   Physical Exam  Constitutional: She is oriented to person, place, and time. She appears well-developed and well-nourished.  Overweight  HENT:  Head: Normocephalic and atraumatic.  Oropharynx with redness and clear drainage.   Eyes: EOM are normal.  Neck: Normal range of motion. No JVD present.  Cardiovascular: Normal rate and regular rhythm.   Pulmonary/Chest: Effort normal and breath sounds normal. No respiratory distress. She has no wheezes. She has no rales.  Abdominal: Soft. Bowel sounds are normal. She exhibits no distension and no mass. There is no tenderness. There is no rebound and no guarding.  No tenderness on exam today  Musculoskeletal: She exhibits no edema.  Lymphadenopathy:    She has no cervical adenopathy.  Neurological: She is alert and oriented to person, place, and time. Coordination normal.  Skin: Skin is warm and dry.  Psychiatric: She has a normal mood and affect.   Vitals:   08/17/17 0809  BP: 130/70  Pulse: 74  Temp: 97.8 F (36.6 C)  TempSrc: Oral  SpO2: 99%  Weight: 133 lb (60.3 kg)  Height: 5\' 4"  (1.626 m)      Assessment & Plan:

## 2017-09-21 ENCOUNTER — Ambulatory Visit
Admission: RE | Admit: 2017-09-21 | Discharge: 2017-09-21 | Disposition: A | Discharge status: Home / Self Care | Payer: Medicare HMO | Source: Ambulatory Visit | Attending: Internal Medicine | Admitting: Internal Medicine

## 2017-09-21 DIAGNOSIS — K7689 Other specified diseases of liver: Secondary | ICD-10-CM | POA: Diagnosis not present

## 2017-09-21 DIAGNOSIS — R1011 Right upper quadrant pain: Secondary | ICD-10-CM

## 2017-10-08 DIAGNOSIS — R69 Illness, unspecified: Secondary | ICD-10-CM | POA: Diagnosis not present

## 2017-10-19 DIAGNOSIS — Z961 Presence of intraocular lens: Secondary | ICD-10-CM | POA: Diagnosis not present

## 2017-10-19 DIAGNOSIS — H469 Unspecified optic neuritis: Secondary | ICD-10-CM | POA: Diagnosis not present

## 2017-10-19 DIAGNOSIS — H40013 Open angle with borderline findings, low risk, bilateral: Secondary | ICD-10-CM | POA: Diagnosis not present

## 2017-10-19 DIAGNOSIS — H01024 Squamous blepharitis left upper eyelid: Secondary | ICD-10-CM | POA: Diagnosis not present

## 2017-10-19 DIAGNOSIS — H01025 Squamous blepharitis left lower eyelid: Secondary | ICD-10-CM | POA: Diagnosis not present

## 2017-10-19 DIAGNOSIS — H01022 Squamous blepharitis right lower eyelid: Secondary | ICD-10-CM | POA: Diagnosis not present

## 2017-10-19 DIAGNOSIS — H01021 Squamous blepharitis right upper eyelid: Secondary | ICD-10-CM | POA: Diagnosis not present

## 2017-10-19 DIAGNOSIS — H10413 Chronic giant papillary conjunctivitis, bilateral: Secondary | ICD-10-CM | POA: Diagnosis not present

## 2017-11-16 ENCOUNTER — Other Ambulatory Visit: Payer: Self-pay | Admitting: Internal Medicine

## 2017-11-16 ENCOUNTER — Telehealth: Payer: Self-pay | Admitting: Internal Medicine

## 2017-11-16 DIAGNOSIS — Z1231 Encounter for screening mammogram for malignant neoplasm of breast: Secondary | ICD-10-CM

## 2017-11-16 DIAGNOSIS — N644 Mastodynia: Secondary | ICD-10-CM

## 2017-11-16 NOTE — Telephone Encounter (Signed)
Copied from [place]. Topic: Quick Communication - See Telephone Encounter >> Nov 16, 2017 10:39 AM [place], Laban Emperor wrote: CRM for notification. See Telephone encounter for:  Pt would like Dr. Sharlet Salina to put an order in to have her right breast looked at.  11/16/17.

## 2017-11-16 NOTE — Telephone Encounter (Signed)
Patient informed. 

## 2017-11-16 NOTE — Telephone Encounter (Signed)
Orders placed.

## 2017-12-02 ENCOUNTER — Ambulatory Visit
Admission: RE | Admit: 2017-12-02 | Discharge: 2017-12-02 | Disposition: A | Discharge status: Home / Self Care | Payer: Medicare HMO | Source: Ambulatory Visit | Attending: Internal Medicine | Admitting: Internal Medicine

## 2017-12-02 DIAGNOSIS — R922 Inconclusive mammogram: Secondary | ICD-10-CM | POA: Diagnosis not present

## 2017-12-02 DIAGNOSIS — N644 Mastodynia: Secondary | ICD-10-CM

## 2017-12-02 DIAGNOSIS — N6489 Other specified disorders of breast: Secondary | ICD-10-CM | POA: Diagnosis not present

## 2017-12-17 DIAGNOSIS — R69 Illness, unspecified: Secondary | ICD-10-CM | POA: Diagnosis not present

## 2018-01-18 DIAGNOSIS — R69 Illness, unspecified: Secondary | ICD-10-CM | POA: Diagnosis not present

## 2018-01-25 DIAGNOSIS — R69 Illness, unspecified: Secondary | ICD-10-CM | POA: Diagnosis not present

## 2018-02-03 DIAGNOSIS — R69 Illness, unspecified: Secondary | ICD-10-CM | POA: Diagnosis not present

## 2018-02-09 DIAGNOSIS — R69 Illness, unspecified: Secondary | ICD-10-CM | POA: Diagnosis not present

## 2018-03-08 ENCOUNTER — Ambulatory Visit (INDEPENDENT_AMBULATORY_CARE_PROVIDER_SITE_OTHER)
Admission: RE | Admit: 2018-03-08 | Discharge: 2018-03-08 | Disposition: A | Discharge status: Home / Self Care | Payer: Medicare HMO | Source: Ambulatory Visit | Attending: Family | Admitting: Family

## 2018-03-08 ENCOUNTER — Other Ambulatory Visit (INDEPENDENT_AMBULATORY_CARE_PROVIDER_SITE_OTHER): Payer: Medicare HMO

## 2018-03-08 ENCOUNTER — Encounter: Payer: Self-pay | Admitting: Family

## 2018-03-08 ENCOUNTER — Ambulatory Visit (INDEPENDENT_AMBULATORY_CARE_PROVIDER_SITE_OTHER): Payer: Medicare HMO | Admitting: Family

## 2018-03-08 VITALS — BP 128/78 | HR 72 | Temp 97.8°F | Ht 64.0 in | Wt 130.1 lb

## 2018-03-08 DIAGNOSIS — R197 Diarrhea, unspecified: Secondary | ICD-10-CM | POA: Diagnosis not present

## 2018-03-08 LAB — CBC WITH DIFFERENTIAL/PLATELET
Basophils Absolute: 0 10*3/uL (ref 0.0–0.1)
Basophils Relative: 0.4 % (ref 0.0–3.0)
EOS ABS: 0.2 10*3/uL (ref 0.0–0.7)
EOS PCT: 3.2 % (ref 0.0–5.0)
HEMATOCRIT: 43.2 % (ref 36.0–46.0)
HEMOGLOBIN: 14.3 g/dL (ref 12.0–15.0)
LYMPHS PCT: 23.7 % (ref 12.0–46.0)
Lymphs Abs: 1.6 10*3/uL (ref 0.7–4.0)
MCHC: 33 g/dL (ref 30.0–36.0)
MCV: 83.8 fl (ref 78.0–100.0)
MONO ABS: 0.7 10*3/uL (ref 0.1–1.0)
Monocytes Relative: 10.3 % (ref 3.0–12.0)
Neutro Abs: 4.3 10*3/uL (ref 1.4–7.7)
Neutrophils Relative %: 62.4 % (ref 43.0–77.0)
Platelets: 296 10*3/uL (ref 150.0–400.0)
RBC: 5.15 Mil/uL — AB (ref 3.87–5.11)
RDW: 14.1 % (ref 11.5–15.5)
WBC: 7 10*3/uL (ref 4.0–10.5)

## 2018-03-08 LAB — COMPREHENSIVE METABOLIC PANEL
ALBUMIN: 3.8 g/dL (ref 3.5–5.2)
ALK PHOS: 57 U/L (ref 39–117)
ALT: 13 U/L (ref 0–35)
AST: 18 U/L (ref 0–37)
BUN: 12 mg/dL (ref 6–23)
CALCIUM: 8.9 mg/dL (ref 8.4–10.5)
CO2: 27 mEq/L (ref 19–32)
Chloride: 106 mEq/L (ref 96–112)
Creatinine, Ser: 0.98 mg/dL (ref 0.40–1.20)
GFR: 59.04 mL/min — ABNORMAL LOW (ref >60.00)
Glucose, Bld: 77 mg/dL (ref 70–99)
POTASSIUM: 4 meq/L (ref 3.5–5.1)
SODIUM: 142 meq/L (ref 135–145)
TOTAL PROTEIN: 6.4 g/dL (ref 6.0–8.3)
Total Bilirubin: 0.4 mg/dL (ref 0.2–1.2)

## 2018-03-08 MED ORDER — CIPROFLOXACIN HCL 500 MG PO TABS: 500.0000 mg | ORAL_TABLET | Freq: Two times a day (BID) | ORAL | 0 refills | Status: DC

## 2018-03-08 NOTE — Progress Notes (Signed)
Wendy Patrick is a 72 y.o. female with the following history as recorded in EpicCare:  Patient Active Problem List   Diagnosis Date Noted  . RUQ pain 08/17/2017  . Routine general medical examination at a health care facility 06/05/2016  . Upper airway cough syndrome 11/01/2015  . Gait disturbance 06/07/2015  . Allergic rhinitis 05/31/2015  . Hyperlipidemia 10/24/2014  . 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 COPD (chronic obstructive pulmonary disease) ([place]) 10/09/2014  . Coronary artery disease 08/01/2009    Current Outpatient Medications  Medication Sig Dispense Refill  . aspirin 81 MG tablet Take 81 mg by mouth daily.    . cholecalciferol (VITAMIN D) 1000 units tablet Take 1,000 Units by mouth daily.    . Omega-3 Fatty Acids (FISH OIL) 500 MG CAPS Take 500 mg by mouth daily.    . ciprofloxacin (CIPRO) 500 MG tablet Take 1 tablet (500 mg total) by mouth 2 (two) times daily. 10 tablet 0   No current facility-administered medications for this visit.     Allergies: Statins; Crestor [rosuvastatin]; and Latex  Past Medical History:  Diagnosis Date  . COPD (chronic obstructive pulmonary disease) ([place])   . Coronary artery disease 08/2009   40% mid left circ by cath in [place], [place]  . Diabetes mellitus without complication ([place])    gestational  . Endometriosis    1986  . Gestational diabetes    age 25  . Granuloma annulare   . Hyperlipidemia   . Optic neuritis    2015  . Optic neuritis Novembr  . Vaginal anomaly    tears and bleeds easily    Past Surgical History:  Procedure Laterality Date  . ABDOMINAL HYSTERECTOMY    . CARDIAC CATHETERIZATION    . COLONOSCOPY    . EYE SURGERY    . MANDIBLE SURGERY    . MINOR HEMORRHOIDECTOMY    . TUBAL LIGATION    . UPPER GASTROINTESTINAL ENDOSCOPY      Family History  Problem Relation Age of Onset  . Cancer Mother        Breast   . Breast cancer Mother   . Stroke Brother        open heart surgery   . Colon cancer Neg Hx   .  Esophageal cancer Neg Hx   . Rectal cancer Neg Hx   . Stomach cancer Neg Hx     Social History   Tobacco Use  . Smoking status: Former Smoker    Packs/day: 2.00    Years: 50.00    Pack years: 100.00    Types: Cigarettes    Last attempt to quit: 02/05/2000    Years since quitting: 18.0  . Smokeless tobacco: Never Used  . Tobacco comment: Educated LDCT for lung Ca to discuss with MD if interested  Substance Use Topics  . Alcohol use: Yes    Alcohol/week: 1.8 oz    Types: 3 Glasses of wine per week    Comment: wine with dinner     Subjective:  Patient presents with concerns for 1 week history of diarrhea- "started out of nowhere." Denies any fever or abdominal pain; no blood in the stool; no vomiting; no blood in the stool; no family members with similar symptoms; colonoscopy up to date as of 2017; has not made any dietary changes in the past week to treat the diarrhea- "eating like I normally do." Has taken Imodium with no benefit; no recent travel or antibiotic use;   Objective:  Vitals:  03/08/18 0849  BP: 128/78  Pulse: 72  Temp: 97.8 F (36.6 C)  TempSrc: Oral  SpO2: 99%  Weight: 130 lb 1.9 oz (59 kg)  Height: '5\' 4"'$  (1.626 m)    General: Well developed, well nourished, in no acute distress  Skin : Warm and dry.  Head: Normocephalic and atraumatic  Lungs: Respirations unlabored; clear to auscultation bilaterally without wheeze, rales, rhonchi  CVS exam: normal rate and regular rhythm.  Abdomen: Soft; nontender; nondistended; normoactive bowel sounds; no masses or hepatosplenomegaly  Neurologic: Alert and oriented; speech intact; face symmetrical; moves all extremities well; CNII-XII intact without focal deficit  Assessment:  1. Diarrhea, unspecified type     Plan:  Suspect infectious source; check CBC, CMP and X-ray today to rule out obstruction; BRAT diet, need for rehydration discussed; will go ahead and start Cipro 500 mg bid x 5 days due to length of time symptoms  present; may need to consider stool studies; follow-up to be determined.   No follow-ups on file.  Orders Placed This Encounter  Procedures  . DG Abd 2 Views    Standing Status:   Future    Number of Occurrences:   1    Standing Expiration Date:   05/09/2019    Order Specific Question:   Reason for Exam (SYMPTOM  OR DIAGNOSIS REQUIRED)    Answer:   abdominal pain    Order Specific Question:   Preferred imaging location?    Answer:   [facility]    Order Specific Question:   Radiology Contrast Protocol - do NOT remove file path    Answer:   \\[email or web address]  . CBC w/Diff    Standing Status:   Future    Number of Occurrences:   1    Standing Expiration Date:   03/08/2019  . Comp Met (CMET)    Standing Status:   Future    Number of Occurrences:   1    Standing Expiration Date:   03/08/2019    Requested Prescriptions   Signed Prescriptions Disp Refills  . ciprofloxacin (CIPRO) 500 MG tablet 10 tablet 0    Sig: Take 1 tablet (500 mg total) by mouth 2 (two) times daily.

## 2018-03-09 NOTE — Progress Notes (Signed)
Spoke with patient today. She felt somewhat better yesterday but stated today she was so so. She feels like she really hasn't seen a big improvement with taking the Cipro as of yet. She also said she was reading the side-effects and one talked about nerves and tendonitis; She is concerned because of her hx of joint pain and nerve damage and wanted to make sure this wasn't going to cause her anymore nerve issues.  Please advise

## 2018-03-24 ENCOUNTER — Other Ambulatory Visit: Payer: Medicare HMO

## 2018-03-24 ENCOUNTER — Ambulatory Visit (INDEPENDENT_AMBULATORY_CARE_PROVIDER_SITE_OTHER): Payer: Medicare HMO | Admitting: Family

## 2018-03-24 ENCOUNTER — Encounter: Payer: Self-pay | Admitting: Family

## 2018-03-24 ENCOUNTER — Ambulatory Visit: Payer: Self-pay | Admitting: *Deleted

## 2018-03-24 ENCOUNTER — Ambulatory Visit (INDEPENDENT_AMBULATORY_CARE_PROVIDER_SITE_OTHER)
Admission: RE | Admit: 2018-03-24 | Discharge: 2018-03-24 | Disposition: A | Discharge status: Home / Self Care | Payer: Medicare HMO | Source: Ambulatory Visit | Attending: Family | Admitting: Family

## 2018-03-24 VITALS — BP 108/68 | HR 69 | Temp 97.8°F | Ht 64.0 in | Wt 128.0 lb

## 2018-03-24 DIAGNOSIS — R05 Cough: Secondary | ICD-10-CM

## 2018-03-24 DIAGNOSIS — R197 Diarrhea, unspecified: Secondary | ICD-10-CM | POA: Diagnosis not present

## 2018-03-24 DIAGNOSIS — R053 Chronic cough: Secondary | ICD-10-CM

## 2018-03-24 NOTE — Telephone Encounter (Signed)
Duplicate Encounter/Please Disregard 

## 2018-03-24 NOTE — Progress Notes (Signed)
Wendy Patrick is a 72 y.o. female with the following history as recorded in EpicCare:  Patient Active Problem List   Diagnosis Date Noted  . RUQ pain 08/17/2017  . Routine general medical examination at a health care facility 06/05/2016  . Upper airway cough syndrome 11/01/2015  . Gait disturbance 06/07/2015  . Allergic rhinitis 05/31/2015  . Hyperlipidemia 10/24/2014  . 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 COPD (chronic obstructive pulmonary disease) ([place]) 10/09/2014  . Coronary artery disease 08/01/2009    Current Outpatient Medications  Medication Sig Dispense Refill  . aspirin 81 MG tablet Take 81 mg by mouth daily.    . cholecalciferol (VITAMIN D) 1000 units tablet Take 1,000 Units by mouth daily.    . Omega-3 Fatty Acids (FISH OIL) 500 MG CAPS Take 500 mg by mouth daily.    . ciprofloxacin (CIPRO) 500 MG tablet Take 1 tablet (500 mg total) by mouth 2 (two) times daily. (Patient not taking: Reported on 03/24/2018) 10 tablet 0   No current facility-administered medications for this visit.     Allergies: Statins; Crestor [rosuvastatin]; and Latex  Past Medical History:  Diagnosis Date  . COPD (chronic obstructive pulmonary disease) ([place])   . Coronary artery disease 08/2009   40% mid left circ by cath in [place], [place]  . Diabetes mellitus without complication ([place])    gestational  . Endometriosis    1986  . Gestational diabetes    age 50  . Granuloma annulare   . Hyperlipidemia   . Optic neuritis    2015  . Optic neuritis Novembr  . Vaginal anomaly    tears and bleeds easily    Past Surgical History:  Procedure Laterality Date  . ABDOMINAL HYSTERECTOMY    . CARDIAC CATHETERIZATION    . COLONOSCOPY    . EYE SURGERY    . MANDIBLE SURGERY    . MINOR HEMORRHOIDECTOMY    . TUBAL LIGATION    . UPPER GASTROINTESTINAL ENDOSCOPY      Family History  Problem Relation Age of Onset  . Cancer Mother        Breast   . Breast cancer Mother   . Stroke Brother        open heart  surgery   . Colon cancer Neg Hx   . Esophageal cancer Neg Hx   . Rectal cancer Neg Hx   . Stomach cancer Neg Hx     Social History   Tobacco Use  . Smoking status: Former Smoker    Packs/day: 2.00    Years: 50.00    Pack years: 100.00    Types: Cigarettes    Last attempt to quit: 02/05/2000    Years since quitting: 18.1  . Smokeless tobacco: Never Used  . Tobacco comment: Educated LDCT for lung Ca to discuss with MD if interested  Substance Use Topics  . Alcohol use: Yes    Alcohol/week: 1.8 oz    Types: 3 Glasses of wine per week    Comment: wine with dinner     Subjective:  Patient presents with recurrent symptoms of diarrhea; was treated earlier this month with Cipro x 5 days- symptoms resolved and patient felt much better until last week; symptoms began again last Friday; denies any blood in stool; no vomiting; no fever;   Objective:  Vitals:   03/24/18 1550  BP: 108/68  Pulse: 69  Temp: 97.8 F (36.6 C)  TempSrc: Oral  SpO2: 97%  Weight: 128 lb (58.1 kg)  Height: 5\' 4"  (1.626 m)    General: Well developed, well nourished, in no acute distress  Skin : Warm and dry.  Head: Normocephalic and atraumatic  Lungs: Respirations unlabored; clear to auscultation bilaterally without wheeze, rales, rhonchi  CVS exam: normal rate and regular rhythm.  Abdomen: Soft; nontender; nondistended; normoactive bowel sounds; no masses or hepatosplenomegaly  Musculoskeletal: No deformities; no active joint inflammation  Extremities: No edema, cyanosis, clubbing  Vessels: Symmetric bilaterally  Neurologic: Alert and oriented; speech intact; face symmetrical; moves all extremities well; CNII-XII intact without focal deficit  Assessment:  1. Diarrhea, unspecified type   2. Chronic cough     Plan:  1. Update stool studies; refer to GI; encouraged patient to limit intake of so many processed foods; follow-up to be determined; 2. Update CXR today;   No follow-ups on file.  Orders  Placed This Encounter  Procedures  . Stool C-Diff Toxin Assay    Standing Status:   Future    Standing Expiration Date:   03/24/2019  . DG Chest 2 View    Standing Status:   Future    Number of Occurrences:   1    Standing Expiration Date:   05/25/2019    Order Specific Question:   Reason for Exam (SYMPTOM  OR DIAGNOSIS REQUIRED)    Answer:   chronic cough    Order Specific Question:   Preferred imaging location?    Answer:   [facility]    Order Specific Question:   Radiology Contrast Protocol - do NOT remove file path    Answer:   \\[email or web address]  . Gastrointestinal Pathogen Panel PCR    Standing Status:   Future    Standing Expiration Date:   03/24/2019  . Ambulatory referral to Gastroenterology    Referral Priority:   Urgent    Referral Type:   Consultation    Referral Reason:   Specialty Services Required    Referred to Provider:   Clarene Essex, MD    Number of Visits Requested:   1    Requested Prescriptions    No prescriptions requested or ordered in this encounter

## 2018-03-24 NOTE — Telephone Encounter (Signed)
Pt  Seen earlier this  Month  Was  Put on  Anti  Biotics  For  Diarrhea. Got  Better  After  Anti biotics  Came  Back   About  5  Days  Ago.  Appointment made  Today  With Jodi Mourning   Reason for Disposition . [1] MODERATE diarrhea (e.g., 4-6 times / day more than normal) AND [2] age > 70 years  Answer Assessment - Initial Assessment Questions 1. DIARRHEA SEVERITY: "How bad is the diarrhea?" "How many extra stools have you had in the past 24 hours than normal?"    - MILD: Few loose or mushy BMs; increase of 1-3 stools over normal daily number of stools; mild increase in ostomy output.   - MODERATE: Increase of 4-6 stools daily over normal; moderate increase in ostomy output.   - SEVERE (or Worst Possible): Increase of 7 or more stools daily over normal; moderate increase in ostomy output; incontinence.      Perhaps  6   2. ONSET: "When did the diarrhea begin?"        5  Days  Ago  Getting  Worse  Today   3. BM CONSISTENCY: "How loose or watery is the diarrhea?"       Watery   4. VOMITING: "Are you also vomiting?" If so, ask: "How many times in the past 24 hours?"      no 5. ABDOMINAL PAIN: "Are you having any abdominal pain?" If yes: "What does it feel like?" (e.g., crampy, dull, intermittent, constant)         Slightly Sev  Days  -  None  Today   6. ABDOMINAL PAIN SEVERITY: If present, ask: "How bad is the pain?"  (e.g., Scale 1-10; mild, moderate, or severe)    - MILD (1-3): doesn't interfere with normal activities, abdomen soft and not tender to touch     - MODERATE (4-7): interferes with normal activities or awakens from sleep, tender to touch     - SEVERE (8-10): excruciating pain, doubled over, unable to do any normal activities          no 7. ORAL INTAKE: If vomiting, "Have you been able to drink liquids?" "How much fluids have you had in the past 24 hours?"         Has  Been  Drinking water  Cocanut  Water    32  Oz     8. HYDRATION: "Any signs of dehydration?" (e.g., dry mouth  [not just dry lips], too weak to stand, dizziness, new weight loss) "When did you last urinate?"     History  Of  Dry  Mouth  Bp has   Been lower  Than  Normal over last  Several  Weeks  -  Has  Been  Dizzy  And  Weak  -  Worse  Over  Last  Several  Days   45  mins  Ago   9. EXPOSURE: "Have you traveled to a foreign country recently?" "Have you been exposed to anyone with diarrhea?" "Could you have eaten any food that was spoiled?"     *No Answer* 10. OTHER SYMPTOMS: "Do you have any other symptoms?" (e.g., fever, blood in stool)       *No Answer* 11. PREGNANCY: "Is there any chance you are pregnant?" "When was your last menstrual period?"       *No Answer*  Protocols used: DIARRHEA-A-AH

## 2018-03-24 NOTE — Patient Instructions (Signed)
Food Choices to Help Relieve Diarrhea, Adult  When you have diarrhea, the foods you eat and your eating habits are very important. Choosing the right foods and drinks can help:  · Relieve diarrhea.  · Replace lost fluids and nutrients.  · Prevent dehydration.    What general guidelines should I follow?  Relieving diarrhea  · Choose foods with less than 2 g or .07 oz. of fiber per serving.  · Limit fats to less than 8 tsp (38 g or 1.34 oz.) a day.  · Avoid the following:  ? Foods and beverages sweetened with high-fructose corn syrup, honey, or sugar alcohols such as xylitol, sorbitol, and mannitol.  ? Foods that contain a lot of fat or sugar.  ? Fried, greasy, or spicy foods.  ? High-fiber grains, breads, and cereals.  ? Raw fruits and vegetables.  · Eat foods that are rich in probiotics. These foods include dairy products such as yogurt and fermented milk products. They help increase healthy bacteria in the stomach and intestines (gastrointestinal tract, or GI tract).  · If you have lactose intolerance, avoid dairy products. These may make your diarrhea worse.  · Take medicine to help stop diarrhea (antidiarrheal medicine) only as told by your health care provider.  Replacing nutrients  · Eat small meals or snacks every 3–4 hours.  · Eat bland foods, such as white rice, toast, or baked potato, until your diarrhea starts to get better. Gradually reintroduce nutrient-rich foods as tolerated or as told by your health care provider. This includes:  ? Well-cooked protein foods.  ? Peeled, seeded, and soft-cooked fruits and vegetables.  ? Low-fat dairy products.  · Take vitamin and mineral supplements as told by your health care provider.  Preventing dehydration    · Start by sipping water or a special solution to prevent dehydration (oral rehydration solution, ORS). Urine that is clear or pale yellow means that you are getting enough fluid.  · Try to drink at least 8–10 cups of fluid each day to help replace lost  fluids.  · You may add other liquids in addition to water, such as clear juice or decaffeinated sports drinks, as tolerated or as told by your health care provider.  · Avoid drinks with caffeine, such as coffee, tea, or soft drinks.  · Avoid alcohol.  What foods are recommended?  The items listed may not be a complete list. Talk with your health care provider about what dietary choices are best for you.  Grains  White rice. White, French, or pita breads (fresh or toasted), including plain rolls, buns, or bagels. White pasta. Saltine, soda, or graham crackers. Pretzels. Low-fiber cereal. Cooked cereals made with water (such as cornmeal, farina, or cream cereals). Plain muffins. Matzo. Melba toast. Zwieback.  Vegetables  Potatoes (without the skin). Most well-cooked and canned vegetables without skins or seeds. Tender lettuce.  Fruits  Apple sauce. Fruits canned in juice. Cooked apricots, cherries, grapefruit, peaches, pears, or plums. Fresh bananas and cantaloupe.  Meats and other protein foods  Baked or boiled chicken. Eggs. Tofu. Fish. Seafood. Smooth nut butters. Ground or well-cooked tender beef, ham, veal, lamb, pork, or poultry.  Dairy  Plain yogurt, kefir, and unsweetened liquid yogurt. Lactose-free milk, buttermilk, skim milk, or soy milk. Low-fat or nonfat hard cheese.  Beverages  Water. Low-calorie sports drinks. Fruit juices without pulp. Strained tomato and vegetable juices. Decaffeinated teas. Sugar-free beverages not sweetened with sugar alcohols. Oral rehydration solutions, if approved by your health care   provider.  Seasoning and other foods  Bouillon, broth, or soups made from recommended foods.  What foods are not recommended?  The items listed may not be a complete list. Talk with your health care provider about what dietary choices are best for you.  Grains  Whole grain, whole wheat, bran, or rye breads, rolls, pastas, and crackers. Wild or brown rice. Whole grain or bran cereals. Barley. Oats  and oatmeal. Corn tortillas or taco shells. Granola. Popcorn.  Vegetables  Raw vegetables. Fried vegetables. Cabbage, broccoli, Brussels sprouts, artichokes, baked beans, beet greens, corn, kale, legumes, peas, sweet potatoes, and yams. Potato skins. Cooked spinach and cabbage.  Fruits  Dried fruit, including raisins and dates. Raw fruits. Stewed or dried prunes. Canned fruits with syrup.  Meat and other protein foods  Fried or fatty meats. Deli meats. Chunky nut butters. Nuts and seeds. Beans and lentils. Bacon. Hot dogs. Sausage.  Dairy  High-fat cheeses. Whole milk, chocolate milk, and beverages made with milk, such as milk shakes. Half-and-half. Cream. sour cream. Ice cream.  Beverages  Caffeinated beverages (such as coffee, tea, soda, or energy drinks). Alcoholic beverages. Fruit juices with pulp. Prune juice. Soft drinks sweetened with high-fructose corn syrup or sugar alcohols. High-calorie sports drinks.  Fats and oils  Butter. Cream sauces. Margarine. Salad oils. Plain salad dressings. Olives. Avocados. Mayonnaise.  Sweets and desserts  Sweet rolls, doughnuts, and sweet breads. Sugar-free desserts sweetened with sugar alcohols such as xylitol and sorbitol.  Seasoning and other foods  Honey. Hot sauce. Chili powder. Gravy. Cream-based or milk-based soups. Pancakes and waffles.  Summary  · When you have diarrhea, the foods you eat and your eating habits are very important.  · Make sure you get at least 8–10 cups of fluid each day, or enough to keep your urine clear or pale yellow.  · Eat bland foods and gradually reintroduce healthy, nutrient-rich foods as tolerated, or as told by your health care provider.  · Avoid high-fiber, fried, greasy, or spicy foods.  This information is not intended to replace advice given to you by your health care provider. Make sure you discuss any questions you have with your health care provider.  Document Released: 02/07/2004 Document Revised: 11/14/2016 Document  Reviewed: 11/14/2016  Elsevier Interactive Patient Education © 2018 [employer]

## 2018-03-25 NOTE — Progress Notes (Signed)
Spoke with husband today. He states patient would like to be referred to Dr. Lyman Bishop for cardiology. And as far as smoking he said she stopped smoking about 20 years ago but not sure how many years she smoked. I will try to call again and get his info from her tomorrow.

## 2018-03-26 ENCOUNTER — Other Ambulatory Visit: Payer: Self-pay | Admitting: Family

## 2018-03-26 ENCOUNTER — Other Ambulatory Visit: Payer: Medicare HMO

## 2018-03-26 DIAGNOSIS — R197 Diarrhea, unspecified: Secondary | ICD-10-CM | POA: Diagnosis not present

## 2018-03-26 DIAGNOSIS — I517 Cardiomegaly: Secondary | ICD-10-CM

## 2018-03-29 ENCOUNTER — Other Ambulatory Visit: Payer: Self-pay | Admitting: Family

## 2018-03-29 LAB — GASTROINTESTINAL PATHOGEN PANEL PCR
C. DIFFICILE TOX A/B, PCR: NOT DETECTED
CRYPTOSPORIDIUM, PCR: NOT DETECTED
Campylobacter, PCR: NOT DETECTED
E COLI (STEC) STX1/STX2, PCR: NOT DETECTED
E coli (ETEC) LT/ST PCR: NOT DETECTED
E coli 0157, PCR: NOT DETECTED
Giardia lamblia, PCR: NOT DETECTED
Norovirus, PCR: NOT DETECTED
ROTAVIRUS, PCR: NOT DETECTED
Salmonella, PCR: NOT DETECTED
Shigella, PCR: NOT DETECTED

## 2018-03-29 NOTE — Progress Notes (Signed)
Medicare requires her to have quit smoking within 15 years to pay for the exam;

## 2018-03-29 NOTE — Progress Notes (Signed)
Spoke with patient. She has been smoking for 43 years and stopped 18 years ago roughly.

## 2018-03-30 LAB — CLOSTRIDIUM DIFFICILE EIA: C DIFFICILE TOXINS A+ B, EIA: NEGATIVE

## 2018-04-01 ENCOUNTER — Telehealth: Payer: Self-pay | Admitting: Cardiology

## 2018-04-01 NOTE — Telephone Encounter (Signed)
I'm fine with that 

## 2018-04-01 NOTE — Telephone Encounter (Signed)
New message   Pt want to be released from care and become a patient of Dr.Hilty  Do both providers agree?

## 2018-04-01 NOTE — Telephone Encounter (Signed)
I'm happy to see her.  Dr. Lemmie Evens

## 2018-04-15 ENCOUNTER — Other Ambulatory Visit: Payer: Self-pay | Admitting: Gastroenterology

## 2018-04-15 DIAGNOSIS — K529 Noninfective gastroenteritis and colitis, unspecified: Secondary | ICD-10-CM

## 2018-04-15 DIAGNOSIS — R634 Abnormal weight loss: Secondary | ICD-10-CM | POA: Diagnosis not present

## 2018-04-15 DIAGNOSIS — R1031 Right lower quadrant pain: Secondary | ICD-10-CM

## 2018-04-20 DIAGNOSIS — H40013 Open angle with borderline findings, low risk, bilateral: Secondary | ICD-10-CM | POA: Diagnosis not present

## 2018-04-20 DIAGNOSIS — H01022 Squamous blepharitis right lower eyelid: Secondary | ICD-10-CM | POA: Diagnosis not present

## 2018-04-20 DIAGNOSIS — H01025 Squamous blepharitis left lower eyelid: Secondary | ICD-10-CM | POA: Diagnosis not present

## 2018-04-20 DIAGNOSIS — H10413 Chronic giant papillary conjunctivitis, bilateral: Secondary | ICD-10-CM | POA: Diagnosis not present

## 2018-04-20 DIAGNOSIS — H01024 Squamous blepharitis left upper eyelid: Secondary | ICD-10-CM | POA: Diagnosis not present

## 2018-04-20 DIAGNOSIS — Z961 Presence of intraocular lens: Secondary | ICD-10-CM | POA: Diagnosis not present

## 2018-04-20 DIAGNOSIS — H469 Unspecified optic neuritis: Secondary | ICD-10-CM | POA: Diagnosis not present

## 2018-04-20 DIAGNOSIS — H01021 Squamous blepharitis right upper eyelid: Secondary | ICD-10-CM | POA: Diagnosis not present

## 2018-04-20 DIAGNOSIS — M316 Other giant cell arteritis: Secondary | ICD-10-CM | POA: Diagnosis not present

## 2018-04-23 ENCOUNTER — Ambulatory Visit
Admission: RE | Admit: 2018-04-23 | Discharge: 2018-04-23 | Disposition: A | Discharge status: Home / Self Care | Payer: Medicare HMO | Source: Ambulatory Visit | Attending: Gastroenterology | Admitting: Gastroenterology

## 2018-04-23 DIAGNOSIS — R634 Abnormal weight loss: Secondary | ICD-10-CM

## 2018-04-23 DIAGNOSIS — R1031 Right lower quadrant pain: Secondary | ICD-10-CM

## 2018-04-23 DIAGNOSIS — K529 Noninfective gastroenteritis and colitis, unspecified: Secondary | ICD-10-CM

## 2018-04-23 DIAGNOSIS — E279 Disorder of adrenal gland, unspecified: Secondary | ICD-10-CM | POA: Diagnosis not present

## 2018-04-23 MED ORDER — IOPAMIDOL (ISOVUE-300) INJECTION 61%
100.0000 mL | Freq: Once | INTRAVENOUS | Status: AC | PRN
  Administered 2018-04-23: 100 mL via INTRAVENOUS

## 2018-05-03 ENCOUNTER — Encounter: Payer: Self-pay | Admitting: Internal Medicine

## 2018-05-03 ENCOUNTER — Ambulatory Visit: Payer: Medicare HMO | Admitting: Internal Medicine

## 2018-05-03 VITALS — BP 122/70 | HR 61 | Ht 64.0 in | Wt 127.4 lb

## 2018-05-03 DIAGNOSIS — Z789 Other specified health status: Secondary | ICD-10-CM

## 2018-05-03 DIAGNOSIS — E7800 Pure hypercholesterolemia, unspecified: Secondary | ICD-10-CM

## 2018-05-03 DIAGNOSIS — M791 Myalgia, unspecified site: Secondary | ICD-10-CM | POA: Insufficient documentation

## 2018-05-03 DIAGNOSIS — I2510 Atherosclerotic heart disease of native coronary artery without angina pectoris: Secondary | ICD-10-CM

## 2018-05-03 NOTE — Progress Notes (Signed)
OFFICE NOTE  Chief Complaint:  Establish cardiologist  Primary Care Physician: Wendy Koch, MD  HPI:  Wendy Patrick is a 72 y.o. female with a past medial history significant for mild coronary artery disease with a 40% circumflex stenosis in 2010, COPD, type 2 diabetes, hyperlipidemia and statin intolerance.  She was previously followed by Dr. Radford Patrick however requested to switch to my care since her husband is a patient of mine.  We will try to see them simultaneously.  Recently she has been having some issues with diarrhea.  She is been undergoing treatment with antibiotics and care by Dr. Watt Patrick.  She feels drained.  She is also congested and having difficulty with allergies and chronic cough.  She reports labile blood pressures from the upper 90s to 140s, but has no history of hypertension and is not on medication.  In fact she takes only aspirin.  She has been previously seen by one of our PharmD's in the lipid clinic to discuss her dyslipidemia and was offered ezetimibe, but did not take the medication due to cost issues.  Currently she reports some discomfort in the left shoulder arm and left back.  This is similar but much less significant to the discomfort she had in 2010.  She says she gets it occasionally.  Is not necessarily worse with exertion or relieved by rest and does not sound particularly anginal to me.  She did have a nuclear stress test last year which was completely normal.  PMHx:  Past Medical History:  Diagnosis Date  . COPD (chronic obstructive pulmonary disease) ([place])   . Coronary artery disease 08/2009   40% mid left circ by cath in [place], [place]  . Endometriosis    1986  . Gestational diabetes   . Granuloma annulare   . Hyperlipidemia   . Optic neuritis    2015  . Optic neuritis Novembr  . Vaginal anomaly    tears and bleeds easily    Past Surgical History:  Procedure Laterality Date  . ABDOMINAL HYSTERECTOMY    . CARDIAC CATHETERIZATION    .  COLONOSCOPY    . EYE SURGERY    . MANDIBLE SURGERY    . MINOR HEMORRHOIDECTOMY    . TUBAL LIGATION    . UPPER GASTROINTESTINAL ENDOSCOPY      FAMHx:  Family History  Problem Relation Age of Onset  . Cancer Mother        Breast   . Breast cancer Mother   . Stroke Brother        open heart surgery   . Colon cancer Neg Hx   . Esophageal cancer Neg Hx   . Rectal cancer Neg Hx   . Stomach cancer Neg Hx     SOCHx:   reports that she quit smoking about 18 years ago. Her smoking use included cigarettes. She has a 100.00 pack-year smoking history. She has never used smokeless tobacco. She reports that she drinks about 1.8 oz of alcohol per week. She reports that she does not use drugs.  ALLERGIES:  Allergies  Allergen Reactions  . Statins     Leg cramps  . Crestor [Rosuvastatin] Other (See Comments)    Leg cramps  . Latex Rash    ROS: Pertinent items noted in HPI and remainder of comprehensive ROS otherwise negative.  HOME MEDS: Current Outpatient Medications on File Prior to Visit  Medication Sig Dispense Refill  . aspirin 81 MG tablet Take 81 mg by mouth daily.    Marland Kitchen  cholecalciferol (VITAMIN D) 1000 units tablet Take 1,000 Units by mouth daily.    Marland Kitchen KRILL OIL PO Take 250 mg by mouth daily.     No current facility-administered medications on file prior to visit.     LABS/IMAGING: No results found for this or any previous visit (from the past 48 hour(s)). No results found.  LIPID PANEL:    Component Value Date/Time   CHOL 270 (H) 05/04/2017 0739   TRIG 158 (H) 05/04/2017 0739   HDL 60 05/04/2017 0739   CHOLHDL 4.5 (H) 05/04/2017 0739   CHOLHDL 4 06/05/2016 0843   VLDL 35.8 06/05/2016 0843   LDLCALC 178 (H) 05/04/2017 0739     WEIGHTS: Wt Readings from Last 3 Encounters:  05/03/18 127 lb 6.4 oz (57.8 kg)  03/24/18 128 lb (58.1 kg)  03/08/18 130 lb 1.9 oz (59 kg)    VITALS: BP 122/70   Pulse 61   Ht 5\' 4"  (1.626 m)   Wt 127 lb 6.4 oz (57.8 kg)   BMI  21.87 kg/m   EXAM: General appearance: alert and no distress Neck: no carotid bruit, no JVD and thyroid not enlarged, symmetric, no tenderness/mass/nodules Lungs: clear to auscultation bilaterally Heart: regular rate and rhythm Abdomen: soft, non-tender; bowel sounds normal; no masses,  no organomegaly Extremities: extremities normal, atraumatic, no cyanosis or edema Pulses: 2+ and symmetric Skin: Skin color, texture, turgor normal. No rashes or lesions Neurologic: Grossly normal Psych: Pleasant  EKG: Sinus rhythm at 61- personally reviewed  ASSESSMENT: 1. Mild nonobstructive coronary disease by cath (2010) 2. Atypical chest pain 3. Dyslipidemia-statin intolerant  PLAN: 1.   Mrs. Wendy Patrick had mild nonobstructive coronary disease and describing atypical chest pain.  She has been statin intolerant.  Cholesterol was above goal with LDL 178 in June 2018.  Recently she is been having issues with diarrhea and we will have to allow this to resolve before further investigating treatment options.  She may be a candidate for PCSK9 inhibitor.  Follow-up in 6 months.  Wendy Casino, MD, [facility], [place] Director of the [facility] Lipid Disorders &  Cardiovascular Risk Reduction Clinic Diplomate of the American Board of Clinical Lipidology Attending Cardiologist  Direct Dial: [phone]  Fax: [phone]  Website:  [email or web address] Wendy Patrick 05/03/2018, 9:00 AM

## 2018-05-03 NOTE — Patient Instructions (Signed)
Your physician wants you to follow-up in: 6 months with Dr. Hilty. You will receive a reminder letter in the mail two months in advance. If you don't receive a letter, please call our office to schedule the follow-up appointment.    

## 2018-05-04 ENCOUNTER — Other Ambulatory Visit: Payer: Self-pay | Admitting: Gastroenterology

## 2018-05-04 DIAGNOSIS — R9389 Abnormal findings on diagnostic imaging of other specified body structures: Secondary | ICD-10-CM

## 2018-05-06 DIAGNOSIS — L920 Granuloma annulare: Secondary | ICD-10-CM | POA: Diagnosis not present

## 2018-05-06 DIAGNOSIS — D485 Neoplasm of uncertain behavior of skin: Secondary | ICD-10-CM | POA: Diagnosis not present

## 2018-05-09 ENCOUNTER — Ambulatory Visit
Admission: RE | Admit: 2018-05-09 | Discharge: 2018-05-09 | Disposition: A | Discharge status: Home / Self Care | Payer: Medicare HMO | Source: Ambulatory Visit | Attending: Gastroenterology | Admitting: Gastroenterology

## 2018-05-09 DIAGNOSIS — R9389 Abnormal findings on diagnostic imaging of other specified body structures: Secondary | ICD-10-CM

## 2018-05-09 DIAGNOSIS — D3502 Benign neoplasm of left adrenal gland: Secondary | ICD-10-CM | POA: Diagnosis not present

## 2018-05-09 MED ORDER — GADOBENATE DIMEGLUMINE 529 MG/ML IV SOLN
12.0000 mL | Freq: Once | INTRAVENOUS | Status: AC | PRN
  Administered 2018-05-09: 12 mL via INTRAVENOUS

## 2018-05-27 ENCOUNTER — Ambulatory Visit (INDEPENDENT_AMBULATORY_CARE_PROVIDER_SITE_OTHER): Payer: Medicare HMO | Admitting: *Deleted

## 2018-05-27 VITALS — BP 122/54 | HR 66 | Resp 18 | Ht 64.0 in | Wt 130.0 lb

## 2018-05-27 DIAGNOSIS — Z0000 Encounter for general adult medical examination without abnormal findings: Secondary | ICD-10-CM

## 2018-05-27 NOTE — Progress Notes (Signed)
Subjective:   Wendy Patrick is a 72 y.o. female who presents for Medicare Annual (Subsequent) preventive examination.  Review of Systems:  No ROS.  Medicare Wellness Visit. Additional risk factors are reflected in the social history.  Cardiac Risk Factors include: advanced age (>23men, >15 women);dyslipidemia Sleep patterns: has restless sleep, gets up 1 times nightly to void and sleeps 6-7 hours nightly.    Home Safety/Smoke Alarms: Feels safe in home. Smoke alarms in place.  Living environment; residence and Firearm Safety: 1-story house/ trailer, no firearms. Lives with husband, no needs for DME, good support system Seat Belt Safety/Bike Helmet: Wears seat belt.     Objective:     Vitals: BP (!) 122/54   Pulse 66   Resp 18   Ht 5\' 4"  (1.626 m)   Wt 130 lb (59 kg)   SpO2 98%   BMI 22.31 kg/m   Body mass index is 22.31 kg/m.  Advanced Directives 05/27/2018 08/13/2016 07/29/2016 05/30/2015 11/06/2014 10/09/2014 10/09/2014  Does Patient Have a Medical Advance Directive? No Yes No No No No No  Type of Advance Directive - [place]  Would patient like information on creating a medical advance directive? Yes (ED - Information included in AVS) - No - patient declined information - No - patient declined information No - patient declined information -    Tobacco Social History   Tobacco Use  Smoking Status Former Smoker  . Packs/day: 2.00  . Years: 50.00  . Pack years: 100.00  . Types: Cigarettes  . Last attempt to quit: 02/05/2000  . Years since quitting: 18.3  Smokeless Tobacco Never Used  Tobacco Comment   Educated LDCT for lung Ca to discuss with MD if interested     Counseling given: Not Answered Comment: Educated LDCT for lung Ca to discuss with MD if interested  Past Medical History:  Diagnosis Date  . COPD (chronic obstructive pulmonary disease) ([place])   . Coronary artery disease 08/2009   40% mid left circ by cath in [place], [place]  .  Endometriosis    1986  . Gestational diabetes   . Granuloma annulare   . Hyperlipidemia   . Optic neuritis    2015  . Optic neuritis Nove[place]  . Vaginal anomaly    tears and bleeds easily   Past Surgical History:  Procedure Laterality Date  . ABDOMINAL HYSTERECTOMY    . CARDIAC CATHETERIZATION    . COLONOSCOPY    . EYE SURGERY    . MANDIBLE SURGERY    . MINOR HEMORRHOIDECTOMY    . TUBAL LIGATION    . UPPER GASTROINTESTINAL ENDOSCOPY     Family History  Problem Relation Age of Onset  . Cancer Mother        Breast   . Breast cancer Mother   . Stroke Brother        open heart surgery   . Colon cancer Neg Hx   . Esophageal cancer Neg Hx   . Rectal cancer Neg Hx   . Stomach cancer Neg Hx    Social History   Socioeconomic History  . Marital status: Married    Spouse name: Not on file  . Number of children: Not on file  . Years of education: Not on file  . Highest education level: Not on file  Occupational History  . Not on file  Social Needs  . Financial resource strain: Not hard at all  . Food  insecurity:    Worry: Never true    Inability: Never true  . Transportation needs:    Medical: No    Non-medical: No  Tobacco Use  . Smoking status: Former Smoker    Packs/day: 2.00    Years: 50.00    Pack years: 100.00    Types: Cigarettes    Last attempt to quit: 02/05/2000    Years since quitting: 18.3  . Smokeless tobacco: Never Used  . Tobacco comment: Educated LDCT for lung Ca to discuss with MD if interested  Substance and Sexual Activity  . Alcohol use: Yes    Alcohol/week: 1.8 oz    Types: 3 Glasses of Jeanee Fabre per week    Comment: Urijah Raynor with dinner   . Drug use: No  . Sexual activity: Never    Partners: Male  Lifestyle  . Physical activity:    Days per week: 4 days    Minutes per session: 40 min  . Stress: To some extent  Relationships  . Social connections:    Talks on phone: More than three times a week    Gets together: More than three times a week      Attends religious service: More than 4 times per year    Active member of club or organization: Yes    Attends meetings of clubs or organizations: More than 4 times per year    Relationship status: Married  Other Topics Concern  . Not on file  Social History Narrative  . Not on file    Outpatient Encounter Medications as of 05/27/2018  Medication Sig  . aspirin 81 MG tablet Take 81 mg by mouth daily.  . cholecalciferol (VITAMIN D) 1000 units tablet Take 1,000 Units by mouth daily.  Marland Kitchen KRILL OIL PO Take 250 mg by mouth daily.   No facility-administered encounter medications on file as of 05/27/2018.     Activities of Daily Living In your present state of health, do you have any difficulty performing the following activities: 05/27/2018  Hearing? N  Vision? N  Difficulty concentrating or making decisions? N  Walking or climbing stairs? N  Dressing or bathing? N  Doing errands, shopping? N  Preparing Food and eating ? N  Using the Toilet? N  In the past six months, have you accidently leaked urine? N  Do you have problems with loss of bowel control? N  Managing your Medications? N  Managing your Finances? N  Housekeeping or managing your Housekeeping? N  Some recent data might be hidden    Patient Care Team: Hoyt Koch, MD as PCP - General (Internal Medicine) Tanda Rockers, MD as Consulting Physician (Pulmonary Disease) Warden Fillers, MD as Consulting Physician (Ophthalmology) Pieter Partridge, DO as Consulting Physician (Neurology) Jerrell Belfast, MD as Consulting Physician (Otolaryngology) Debara Pickett Nadean Corwin, MD as Consulting Physician (Cardiology) Clarene Essex, MD as Consulting Physician (Gastroenterology)    Assessment:   This is a routine wellness examination for [place]. Physical assessment deferred to PCP.  Exercise Activities and Dietary recommendations Current Exercise Habits: Home exercise routine, Type of exercise: walking;stretching(balance  exercises), Time (Minutes): 40, Frequency (Times/Week): 4, Weekly Exercise (Minutes/Week): 160, Intensity: Mild, Exercise limited by: None identified  Diet (meal preparation, eat out, water intake, caffeinated beverages, dairy products, fruits and vegetables): in general, a "healthy" diet  , well balanced,eats a variety of fruits and vegetables daily, limits salt, fat/cholesterol, sugar,carbohydrates,caffeine, drinks 6-8 glasses of water daily.  Goals    . Patient Stated  Continue to exercise, enjoy life and family. Stay socially active.     . Reduce fat intake to X grams per day     Will continue on the [place] diet; continue with home exercise         Fall Risk Fall Risk  05/27/2018 08/17/2017 06/05/2016 05/30/2015  Falls in the past year? No No No Yes  Comment - - - when dizzy / fell x 2 but was related to dizziness; now resolved  Injury with Fall? - - - No  Comment - - - states she is a little off balance  Risk for fall due to : - - - Other (Comment)  Risk for fall due to: Comment - - - resolved      Depression Screen PHQ 2/9 Scores 05/27/2018 08/17/2017 06/05/2016 05/30/2015  PHQ - 2 Score 1 1 0 0     Cognitive Function MMSE - Mini Mental State Exam 05/27/2018 05/30/2015  Not completed: - Unable to complete  Orientation to time 5 -  Orientation to Place 5 -  Registration 3 -  Attention/ Calculation 3 -  Recall 3 -  Language- name 2 objects 2 -  Language- repeat 1 -  Language- follow 3 step command 3 -  Language- read & follow direction 1 -  Write a sentence 1 -  Copy design 1 -  Total score 28 -        Immunization History  Administered Date(s) Administered  . Pneumococcal Conjugate-13 06/05/2016  . Pneumococcal Polysaccharide-23 12/01/2009  . Tdap 12/01/2010  . Zoster 12/02/2011   Screening Tests Health Maintenance  Topic Date Due  . DEXA SCAN  08/30/2009  . INFLUENZA VACCINE  07/01/2018  . COLONOSCOPY  08/14/2019  . MAMMOGRAM  12/03/2019  . TETANUS/TDAP   12/01/2020  . Hepatitis C Screening  Completed  . PNA vac Low Risk Adult  Completed      Plan:    Continue doing brain stimulating activities (puzzles, reading, adult coloring books, staying active) to keep memory sharp.   Continue to eat heart healthy diet (full of fruits, vegetables, whole grains, lean protein, water--limit salt, fat, and sugar intake) and increase physical activity as tolerated.   I have personally reviewed and noted the following in the patient's chart:   . Medical and social history . Use of alcohol, tobacco or illicit drugs  . Current medications and supplements . Functional ability and status . Nutritional status . Physical activity . Advanced directives . List of other physicians . Vitals . Screenings to include cognitive, depression, and falls . Referrals and appointments  In addition, I have reviewed and discussed with patient certain preventive protocols, quality metrics, and best practice recommendations. A written personalized care plan for preventive services as well as general preventive health recommendations were provided to patient.     Michiel Cowboy, RN  05/27/2018

## 2018-05-27 NOTE — Patient Instructions (Addendum)
Continue doing brain stimulating activities (puzzles, reading, adult coloring books, staying active) to keep memory sharp.   Continue to eat heart healthy diet (full of fruits, vegetables, whole grains, lean protein, water--limit salt, fat, and sugar intake) and increase physical activity as tolerated.  Wendy Patrick , Thank you for taking time to come for your Medicare Wellness Visit. I appreciate your ongoing commitment to your health goals. Please review the following plan we discussed and let me know if I can assist you in the future.   These are the goals we discussed: Goals    . Patient Stated     Continue to exercise, enjoy life and family. Stay socially active.     . Reduce fat intake to X grams per day     Will continue on the [place] diet; continue with home exercise         This is a list of the screening recommended for you and due dates:  Health Maintenance  Topic Date Due  . DEXA scan (bone density measurement)  08/30/2009  . Flu Shot  07/01/2018  . Colon Cancer Screening  08/14/2019  . Mammogram  12/03/2019  . Tetanus Vaccine  12/01/2020  .  Hepatitis C: One time screening is recommended by Center for Disease Control  (CDC) for  adults born from [birth year] through 1967.   Completed  . Pneumonia vaccines  Completed   It is important to avoid accidents which may result in broken bones.  Here are a few ideas on how to make your home safer so you will be less likely to trip or fall.  1. Use nonskid mats or non slip strips in your shower or tub, on your bathroom floor and around sinks.  If you know that you have spilled water, wipe it up! 2. In the bathroom, it is important to have properly installed grab bars on the walls or on the edge of the tub.  Towel racks are NOT strong enough for you to hold onto or to pull on for support. 3. Stairs and hallways should have enough light.  Add lamps or night lights if you need ore light. 4. It is good to have handrails on both sides of the  stairs if possible.  Always fix broken handrails right away. 5. It is important to see the edges of steps.  Paint the edges of outdoor steps white so you can see them better.  Put colored tape on the edge of inside steps. 6. Throw-rugs are dangerous because they can slide.  Removing the rugs is the best idea, but if they must stay, add adhesive carpet tape to prevent slipping. 7. Do not keep things on stairs or in the halls.  Remove small furniture that blocks the halls as it may cause you to trip.  Keep telephone and electrical cords out of the way where you walk. 8. Always were sturdy, rubber-soled shoes for good support.  Never wear just socks, especially on the stairs.  Socks may cause you to slip or fall.  Do not wear full-length housecoats as you can easily trip on the bottom.  9. Place the things you use the most on the shelves that are the easiest to reach.  If you use a stepstool, make sure it is in good condition.  If you feel unsteady, DO NOT climb, ask for help. If a health professional advises you to use a cane or walker, do not be ashamed.  These items can keep you from falling  and breaking your bones.Health Maintenance, Female Adopting a healthy lifestyle and getting preventive care can go a long way to promote health and wellness. Talk with your health care provider about what schedule of regular examinations is right for you. This is a good chance for you to check in with your provider about disease prevention and staying healthy. In between checkups, there are plenty of things you can do on your own. Experts have done a lot of research about which lifestyle changes and preventive measures are most likely to keep you healthy. Ask your health care provider for more information. Weight and diet Eat a healthy diet  Be sure to include plenty of vegetables, fruits, low-fat dairy products, and lean protein.  Do not eat a lot of foods high in solid fats, added sugars, or salt.  Get regular  exercise. This is one of the most important things you can do for your health. ? Most adults should exercise for at least 150 minutes each week. The exercise should increase your heart rate and make you sweat (moderate-intensity exercise). ? Most adults should also do strengthening exercises at least twice a week. This is in addition to the moderate-intensity exercise.  Maintain a healthy weight  Body mass index (BMI) is a measurement that can be used to identify possible weight problems. It estimates body fat based on height and weight. Your health care provider can help determine your BMI and help you achieve or maintain a healthy weight.  For females 110 years of age and older: ? A BMI below 18.5 is considered underweight. ? A BMI of 18.5 to 24.9 is normal. ? A BMI of 25 to 29.9 is considered overweight. ? A BMI of 30 and above is considered obese.  Watch levels of cholesterol and blood lipids  You should start having your blood tested for lipids and cholesterol at 74 years of age, then have this test every 5 years.  You may need to have your cholesterol levels checked more often if: ? Your lipid or cholesterol levels are high. ? You are older than 74 years of age. ? You are at high risk for heart disease.  Cancer screening Lung Cancer  Lung cancer screening is recommended for adults 44-59 years old who are at high risk for lung cancer because of a history of smoking.  A yearly low-dose CT scan of the lungs is recommended for people who: ? Currently smoke. ? Have quit within the past 15 years. ? Have at least a 30-pack-year history of smoking. A pack year is smoking an average of one pack of cigarettes a day for 1 year.  Yearly screening should continue until it has been 15 years since you quit.  Yearly screening should stop if you develop a health problem that would prevent you from having lung cancer treatment.  Breast Cancer  Practice breast self-awareness. This means  understanding how your breasts normally appear and feel.  It also means doing regular breast self-exams. Let your health care provider know about any changes, no matter how small.  If you are in your 20s or 30s, you should have a clinical breast exam (CBE) by a health care provider every 1-3 years as part of a regular health exam.  If you are 61 or older, have a CBE every year. Also consider having a breast X-ray (mammogram) every year.  If you have a family history of breast cancer, talk to your health care provider about genetic screening.  If you  are at high risk for breast cancer, talk to your health care provider about having an MRI and a mammogram every year.  Breast cancer gene (BRCA) assessment is recommended for women who have family members with BRCA-related cancers. BRCA-related cancers include: ? Breast. ? Ovarian. ? Tubal. ? Peritoneal cancers.  Results of the assessment will determine the need for genetic counseling and BRCA1 and BRCA2 testing.  Cervical Cancer Your health care provider may recommend that you be screened regularly for cancer of the pelvic organs (ovaries, uterus, and vagina). This screening involves a pelvic examination, including checking for microscopic changes to the surface of your cervix (Pap test). You may be encouraged to have this screening done every 3 years, beginning at age 48.  For women ages 1-65, health care providers may recommend pelvic exams and Pap testing every 3 years, or they may recommend the Pap and pelvic exam, combined with testing for human papilloma virus (HPV), every 5 years. Some types of HPV increase your risk of cervical cancer. Testing for HPV may also be done on women of any age with unclear Pap test results.  Other health care providers may not recommend any screening for nonpregnant women who are considered low risk for pelvic cancer and who do not have symptoms. Ask your health care provider if a screening pelvic exam is  right for you.  If you have had past treatment for cervical cancer or a condition that could lead to cancer, you need Pap tests and screening for cancer for at least 20 years after your treatment. If Pap tests have been discontinued, your risk factors (such as having a new sexual partner) need to be reassessed to determine if screening should resume. Some women have medical problems that increase the chance of getting cervical cancer. In these cases, your health care provider may recommend more frequent screening and Pap tests.  Colorectal Cancer  This type of cancer can be detected and often prevented.  Routine colorectal cancer screening usually begins at 74 years of age and continues through 74 years of age.  Your health care provider may recommend screening at an earlier age if you have risk factors for colon cancer.  Your health care provider may also recommend using home test kits to check for hidden blood in the stool.  A small camera at the end of a tube can be used to examine your colon directly (sigmoidoscopy or colonoscopy). This is done to check for the earliest forms of colorectal cancer.  Routine screening usually begins at age 31.  Direct examination of the colon should be repeated every 5-10 years through 74 years of age. However, you may need to be screened more often if early forms of precancerous polyps or small growths are found.  Skin Cancer  Check your skin from head to toe regularly.  Tell your health care provider about any new moles or changes in moles, especially if there is a change in a mole's shape or color.  Also tell your health care provider if you have a mole that is larger than the size of a pencil eraser.  Always use sunscreen. Apply sunscreen liberally and repeatedly throughout the day.  Protect yourself by wearing long sleeves, pants, a wide-brimmed hat, and sunglasses whenever you are outside.  Heart disease, diabetes, and high blood  pressure  High blood pressure causes heart disease and increases the risk of stroke. High blood pressure is more likely to develop in: ? People who have blood pressure in  the high end of the normal range (130-139/85-89 mm Hg). ? People who are overweight or obese. ? People who are African American.  If you are 43-87 years of age, have your blood pressure checked every 3-5 years. If you are 28 years of age or older, have your blood pressure checked every year. You should have your blood pressure measured twice-once when you are at a hospital or clinic, and once when you are not at a hospital or clinic. Record the average of the two measurements. To check your blood pressure when you are not at a hospital or clinic, you can use: ? An automated blood pressure machine at a pharmacy. ? A home blood pressure monitor.  If you are between 58 years and 24 years old, ask your health care provider if you should take aspirin to prevent strokes.  Have regular diabetes screenings. This involves taking a blood sample to check your fasting blood sugar level. ? If you are at a normal weight and have a low risk for diabetes, have this test once every three years after 74 years of age. ? If you are overweight and have a high risk for diabetes, consider being tested at a younger age or more often. Preventing infection Hepatitis B  If you have a higher risk for hepatitis B, you should be screened for this virus. You are considered at high risk for hepatitis B if: ? You were born in a country where hepatitis B is common. Ask your health care provider which countries are considered high risk. ? Your parents were born in a high-risk country, and you have not been immunized against hepatitis B (hepatitis B vaccine). ? You have HIV or AIDS. ? You use needles to inject street drugs. ? You live with someone who has hepatitis B. ? You have had sex with someone who has hepatitis B. ? You get hemodialysis  treatment. ? You take certain medicines for conditions, including cancer, organ transplantation, and autoimmune conditions.  Hepatitis C  Blood testing is recommended for: ? Everyone born from [birth year] through 1967. ? Anyone with known risk factors for hepatitis C.  Sexually transmitted infections (STIs)  You should be screened for sexually transmitted infections (STIs) including gonorrhea and chlamydia if: ? You are sexually active and are younger than 74 years of age. ? You are older than 74 years of age and your health care provider tells you that you are at risk for this type of infection. ? Your sexual activity has changed since you were last screened and you are at an increased risk for chlamydia or gonorrhea. Ask your health care provider if you are at risk.  If you do not have HIV, but are at risk, it may be recommended that you take a prescription medicine daily to prevent HIV infection. This is called pre-exposure prophylaxis (PrEP). You are considered at risk if: ? You are sexually active and do not regularly use condoms or know the HIV status of your partner(s). ? You take drugs by injection. ? You are sexually active with a partner who has HIV.  Talk with your health care provider about whether you are at high risk of being infected with HIV. If you choose to begin PrEP, you should first be tested for HIV. You should then be tested every 3 months for as long as you are taking PrEP. Pregnancy  If you are premenopausal and you may become pregnant, ask your health care provider about preconception counseling.  If  you may become pregnant, take 400 to 800 micrograms (mcg) of folic acid every day.  If you want to prevent pregnancy, talk to your health care provider about birth control (contraception). Osteoporosis and menopause  Osteoporosis is a disease in which the bones lose minerals and strength with aging. This can result in serious bone fractures. Your risk for osteoporosis  can be identified using a bone density scan.  If you are 47 years of age or older, or if you are at risk for osteoporosis and fractures, ask your health care provider if you should be screened.  Ask your health care provider whether you should take a calcium or vitamin D supplement to lower your risk for osteoporosis.  Menopause may have certain physical symptoms and risks.  Hormone replacement therapy may reduce some of these symptoms and risks. Talk to your health care provider about whether hormone replacement therapy is right for you. Follow these instructions at home:  Schedule regular health, dental, and eye exams.  Stay current with your immunizations.  Do not use any tobacco products including cigarettes, chewing tobacco, or electronic cigarettes.  If you are pregnant, do not drink alcohol.  If you are breastfeeding, limit how much and how often you drink alcohol.  Limit alcohol intake to no more than 1 drink per day for nonpregnant women. One drink equals 12 ounces of beer, 5 ounces of Cordie Beazley, or 1 ounces of hard liquor.  Do not use street drugs.  Do not share needles.  Ask your health care provider for help if you need support or information about quitting drugs.  Tell your health care provider if you often feel depressed.  Tell your health care provider if you have ever been abused or do not feel safe at home. This information is not intended to replace advice given to you by your health care provider. Make sure you discuss any questions you have with your health care provider. Document Released: 06/02/2011 Document Revised: 04/24/2016 Document Reviewed: 08/21/2015 Elsevier Interactive Patient Education  [employer].

## 2018-05-28 NOTE — Progress Notes (Signed)
Medical screening examination/treatment/procedure(s) were performed by non-physician practitioner and as supervising physician I was immediately available for consultation/collaboration. I agree with above. Elizabeth A Crawford, MD 

## 2018-07-14 DIAGNOSIS — D485 Neoplasm of uncertain behavior of skin: Secondary | ICD-10-CM | POA: Diagnosis not present

## 2018-07-14 DIAGNOSIS — L814 Other melanin hyperpigmentation: Secondary | ICD-10-CM | POA: Diagnosis not present

## 2018-07-14 DIAGNOSIS — L570 Actinic keratosis: Secondary | ICD-10-CM | POA: Diagnosis not present

## 2018-08-20 DIAGNOSIS — H01022 Squamous blepharitis right lower eyelid: Secondary | ICD-10-CM | POA: Diagnosis not present

## 2018-08-20 DIAGNOSIS — H40013 Open angle with borderline findings, low risk, bilateral: Secondary | ICD-10-CM | POA: Diagnosis not present

## 2018-08-20 DIAGNOSIS — H469 Unspecified optic neuritis: Secondary | ICD-10-CM | POA: Diagnosis not present

## 2018-08-20 DIAGNOSIS — G500 Trigeminal neuralgia: Secondary | ICD-10-CM | POA: Diagnosis not present

## 2018-08-20 DIAGNOSIS — Z961 Presence of intraocular lens: Secondary | ICD-10-CM | POA: Diagnosis not present

## 2018-08-20 DIAGNOSIS — H01021 Squamous blepharitis right upper eyelid: Secondary | ICD-10-CM | POA: Diagnosis not present

## 2018-08-20 DIAGNOSIS — H01024 Squamous blepharitis left upper eyelid: Secondary | ICD-10-CM | POA: Diagnosis not present

## 2018-08-20 DIAGNOSIS — H10413 Chronic giant papillary conjunctivitis, bilateral: Secondary | ICD-10-CM | POA: Diagnosis not present

## 2018-08-20 DIAGNOSIS — H01025 Squamous blepharitis left lower eyelid: Secondary | ICD-10-CM | POA: Diagnosis not present

## 2018-10-06 ENCOUNTER — Encounter: Payer: Self-pay | Admitting: Neurology

## 2018-10-06 ENCOUNTER — Ambulatory Visit: Payer: Medicare HMO | Admitting: Neurology

## 2018-10-06 VITALS — BP 127/78 | HR 71 | Ht 64.0 in | Wt 125.0 lb

## 2018-10-06 DIAGNOSIS — G501 Atypical facial pain: Secondary | ICD-10-CM

## 2018-10-06 DIAGNOSIS — M791 Myalgia, unspecified site: Secondary | ICD-10-CM | POA: Diagnosis not present

## 2018-10-06 DIAGNOSIS — Z8669 Personal history of other diseases of the nervous system and sense organs: Secondary | ICD-10-CM

## 2018-10-06 DIAGNOSIS — M7918 Myalgia, other site: Secondary | ICD-10-CM | POA: Insufficient documentation

## 2018-10-06 DIAGNOSIS — R200 Anesthesia of skin: Secondary | ICD-10-CM | POA: Diagnosis not present

## 2018-10-06 MED ORDER — GABAPENTIN 100 MG PO CAPS: ORAL_CAPSULE | ORAL | 5 refills | Status: DC

## 2018-10-06 NOTE — Progress Notes (Signed)
Marland Kitchen   [facility]  PATIENT: JEORGIA HELMING DOB: 1946-01-05  REASON FOR VISIT: optic neuritis                                 HISTORICAL  CHIEF COMPLAINT:  Chief Complaint  Patient presents with  . New Evaluation    RM 12 wtih husband. Pt last saw Dr. Felecia Shelling 06/07/15. Here for new eval. for trigeminal neuralgia. Paper referral from Dr. Katy Fitch.   . Pain    Having pain all over her body. Most severe in right leg. Takes OTC ibuprofen for this. Balance is affected. Head feels like "balloon blowing up".     HISTORY OF PRESENT ILLNESS:   Skila Rollins is a 72 year old woman who had an episode of left optic neuritis in November 2015 who has been experiencing right cheek pain since February 2016.  At the time she had dental issues and had a tooth extraction.     She had a dry socket and needed a couple procedures.    Pain is constant with some fluctuation and she also notes a scratchy sensation in her throat.   She feel the  Cheek and the inside of the mouth and throat are mildly numb.      She is also experiencing pain elsewhere in the arms, legs and eyes over the last year.     Pain is worse with activity.    Rest helps slightly if she lays on her right side.  Ibuprofen helps the pain a a little bit.    Her legs feel a little weaker and she needs to rest more.   A few years ago she was much more active.      She has reduced vision on the left since an episode of optic neuritis.    She feels that eye is dry and scratchy.    She had optic neuritis on the left in 2016.   She felt vision got slightly better but then worsened again after cataract surgery.      She feels a little off balanced.      She is sleeping poorly.   She averages 6 hours sleep a night but wakes up several times most nights.         I personally reviewed the MRI of the brain 07/09/2015  Her spinal fluid was not truly consistent with MS. She had normal IgG index and normal oligoclonal bands were not present (the  report states 5 bands appeared more prominent).     After her last visit I reviewed the MRI of the brain that showed multiple deep white matter, subcortical and periventricular foci in a pattern more consistent with chronic small vessel ischemic changes than to multiple sclerosis.   Her spinal fluid was not truly consistent with MS. She had normal IgG index and normal oligoclonal bands were not present (the report states 5 bands appeared more prominent).    Since her last visit, she denies any new neurologic symptom lasting over a day.   She had another eye exam last month and was told everything looked good.     She had left eye pain x 1 hour but no change in vision a couplel months ago.     She feels her gait is mildly off and she veers to one side or the other.   THis has been going on about a year.  She often has ringing in her ears.     The vertigo is translational where she feels like she is being pushed a little bit to the one side (or the other). She notes this most when she is dancing and after a spin feels like she is being pushed one way. However, she has also  noted this while walking down a hallway.  These episodes are occurring less frequently than they used to. Occasionally she has tinnitus  REVIEW OF SYSTEMS:  Constitutional: No fevers, chills, sweats, or change in appetite.   Decreased energy is noted Eyes: No visual changes, double vision, eye pain Ear, nose and throat: No hearing loss, ear pain, nasal congestion, sore throat.   She has a spinning sensation and tinnitus.   Cardiovascular: No chest pain, palpitations Respiratory:  No shortness of breath at rest or with exertion.   No wheezes GastrointestinaI: No nausea, vomiting, diarrhea, abdominal pain, fecal incontinence Genitourinary:  as above. Musculoskeletal:  No neck pain, back pain Integumentary: No rash, pruritus, skin lesions Neurological: as above Psychiatric: No depression at this time.  No anxiety Endocrine: No  palpitations, diaphoresis, change in appetite, change in weigh or increased thirst Hematologic/Lymphatic:  No anemia, purpura, petechiae. Allergic/Immunologic: No itchy/runny eyes, nasal congestion, recent allergic reactions, rashes  ALLERGIES: Allergies  Allergen Reactions  . Statins     Leg cramps  . Crestor [Rosuvastatin] Other (See Comments)    Leg cramps  . Latex Rash    HOME MEDICATIONS: Outpatient Medications Prior to Visit  Medication Sig Dispense Refill  . cholecalciferol (VITAMIN D) 1000 units tablet Take 1,000 Units by mouth daily.    Marland Kitchen KRILL OIL PO Take 250 mg by mouth daily.    Marland Kitchen aspirin 81 MG tablet Take 81 mg by mouth daily.     No facility-administered medications prior to visit.     PAST MEDICAL HISTORY: Past Medical History:  Diagnosis Date  . COPD (chronic obstructive pulmonary disease) ([place])   . Coronary artery disease 08/2009   40% mid left circ by cath in [place], [place]  . Endometriosis    1986  . Gestational diabetes   . Granuloma annulare   . Hyperlipidemia   . Optic neuritis    2015  . Optic neuritis Novembr  . Vaginal anomaly    tears and bleeds easily    PAST SURGICAL HISTORY: Past Surgical History:  Procedure Laterality Date  . ABDOMINAL HYSTERECTOMY    . CARDIAC CATHETERIZATION    . COLONOSCOPY    . EYE SURGERY    . MANDIBLE SURGERY    . MINOR HEMORRHOIDECTOMY    . TUBAL LIGATION    . UPPER GASTROINTESTINAL ENDOSCOPY      FAMILY HISTORY: Family History  Problem Relation Age of Onset  . Cancer Mother        Breast   . Breast cancer Mother   . Stroke Brother        open heart surgery   . Colon cancer Neg Hx   . Esophageal cancer Neg Hx   . Rectal cancer Neg Hx   . Stomach cancer Neg Hx     SOCIAL HISTORY:  Social History   Socioeconomic History  . Marital status: Married    Spouse name: Iona Beard  . Number of children: 1  . Years of education: GED  . Highest education level: Not on file  Occupational History  .  Occupation: Retired  Scientific laboratory technician  . Financial resource strain: Not hard at all  .  Food insecurity:    Worry: Never true    Inability: Never true  . Transportation needs:    Medical: No    Non-medical: No  Tobacco Use  . Smoking status: Former Smoker    Packs/day: 2.00    Years: 50.00    Pack years: 100.00    Types: Cigarettes    Last attempt to quit: 02/05/2000    Years since quitting: 18.6  . Smokeless tobacco: Never Used  . Tobacco comment: Educated LDCT for lung Ca to discuss with MD if interested  Substance and Sexual Activity  . Alcohol use: Yes    Alcohol/week: 3.0 standard drinks    Types: 3 Glasses of wine per week    Comment: wine with dinner   . Drug use: No  . Sexual activity: Never    Partners: Male  Lifestyle  . Physical activity:    Days per week: 4 days    Minutes per session: 40 min  . Stress: To some extent  Relationships  . Social connections:    Talks on phone: More than three times a week    Gets together: More than three times a week    Attends religious service: More than 4 times per year    Active member of club or organization: Yes    Attends meetings of clubs or organizations: More than 4 times per year    Relationship status: Married  . Intimate partner violence:    Fear of current or ex partner: No    Emotionally abused: No    Physically abused: No    Forced sexual activity: No  Other Topics Concern  . Not on file  Social History Narrative   Lives w/ husband    Caffeine use: Coffee daily   Right handed      PHYSICAL EXAM  Vitals:   10/06/18 1521  BP: 127/78  Pulse: 71  SpO2: 98%  Weight: 125 lb (56.7 kg)  Height: _0  (1.626 m)    Body mass index is 21.46 kg/m.   General: The patient is well-developed and well-nourished and in no acute distress  Eyes:  Funduscopic exam shows mild left optic disc pallor.  Retinal vessels appear normal.  Neck: The neck has good ROM.  The neck is nontender.   Neurologic Exam  Mental  status: The patient is alert and oriented x 3 at the time of the examination. The patient has apparent normal recent and remote memory, with an apparently normal attention span and concentration ability.   Speech is normal.  Cranial nerves: Extraocular movements are full.  She has a 1+ left APD.  Visual fields are full.  Color vision is desaturated out of the left eye.  Reduced V2 temperature sensation in face.   Allodynia in V2 distribution inside mouth (tongue, gums, inner cheek.   Facial strength is normal.  Trapezius and sternocleidomastoid strength is normal. No dysarthria is noted.  The tongue is midline, and the patient has symmetric elevation of the soft palate. No obvious hearing deficits are noted.  Motor:  Muscle bulk and tone are normal. Strength is  5 / 5 in all 4 extremities.   Sensory: Sensory testing is intact to touch in all 4 extremities.  Coordination: Cerebellar testing reveals good finger-nose-finger bilaterally.  Gait and station: Station and gait are normal. Tandem gait is normal for age (minimaly wide). Romberg is negative.      Reflexes: Deep tendon reflexes are symmetric and normal bilaterally. Plantar responses are  normal.     DIAGNOSTIC DATA (LABS, IMAGING, TESTING) - I reviewed patient records, labs, notes, testing and imaging myself where available.         ASSESSMENT AND PLAN  Atypical facial pain - Plan: Sedimentation rate, C-reactive protein, MR BRAIN W WO CONTRAST  Facial numbness - Plan: Sedimentation rate, C-reactive protein, MR BRAIN W WO CONTRAST  History of optic neuritis - Plan: Sedimentation rate, C-reactive protein, MR BRAIN W WO CONTRAST  Myalgia  1.   MRI of the brain to determine if there is a structural etiology to her atypical facial pain.  Also as she has had white matter changes in the past and had optic neuritis we will compare with her previous MRI.  MS is unlikely given the appearance of her last MRI, however.   2.   Remain active  and exercise as tolerated.   3.   Check ESR and CRP to assess for possibility of temporal arteritis.  4.   Return in 3 months or sooner if there are new or worsening neurologic symptoms.  Quinnlyn Hearns A. Felecia Shelling, MD, PhD 41/0/3013, 1:43 PM Certified in Neurology, Clinical Neurophysiology, Sleep Medicine, Pain Medicine and Neuroimaging  [facility] [address] [place], [place] [address] [phone]

## 2018-10-07 ENCOUNTER — Telehealth: Payer: Self-pay | Admitting: Neurology

## 2018-10-07 LAB — C-REACTIVE PROTEIN: CRP: 3 mg/L (ref 0–10)

## 2018-10-07 LAB — SEDIMENTATION RATE: Sed Rate: 2 mm/hr (ref 0–40)

## 2018-10-07 NOTE — Telephone Encounter (Signed)
Notice of approval faxed to pharmacy. Received fax confirmation.

## 2018-10-07 NOTE — Telephone Encounter (Signed)
We received a PA request from her pharmacy this am. We will work on this for her.

## 2018-10-07 NOTE — Telephone Encounter (Signed)
I called pt to let her know PA approved. She verbalized understanding and appreciation.

## 2018-10-07 NOTE — Telephone Encounter (Signed)
[employer] Medicare order sent to GI. They will obtain the auth and they will reach out to the pt to schedule.

## 2018-10-07 NOTE — Telephone Encounter (Signed)
PA submitted through cover my meds under [employer].  [place] to hear a response within 24 hrs.

## 2018-10-07 NOTE — Telephone Encounter (Signed)
Spoke to the patient she is aware I gave her GI phone number of [phone] and to give them a call if she has not heard in the next 2-3 business days.

## 2018-10-07 NOTE — Telephone Encounter (Signed)
PA Case: [id number], Status: Approved, Coverage Starts on: 12/01/2017, Coverage Ends on: 12/01/2019. Questions? Contact [phone].

## 2018-10-07 NOTE — Telephone Encounter (Signed)
I spoke to the patient she informed she went to the pharmacy and they told her the insurance will not pay for 4 pills a day with gabapentin only 3 pills a day.

## 2018-10-08 ENCOUNTER — Telehealth: Payer: Self-pay | Admitting: *Deleted

## 2018-10-08 NOTE — Telephone Encounter (Signed)
Spoke with pt. and per RAS, advised that lab work drawn in our office is normal. She verbalized understanding of same/fim

## 2018-10-08 NOTE — Telephone Encounter (Signed)
-----   Message from Britt Bottom, MD sent at 10/08/2018 12:17 PM EST ----- Please let the patient know that the lab work for inflammation was normal..

## 2018-10-13 ENCOUNTER — Institutional Professional Consult (permissible substitution): Payer: Medicare HMO | Admitting: Neurology

## 2018-10-14 ENCOUNTER — Institutional Professional Consult (permissible substitution): Payer: Medicare HMO | Admitting: Neurology

## 2018-10-14 NOTE — Telephone Encounter (Signed)
Wendy Patrick: [id number] (exp. 10/13/18 to 01/11/19). Patient is scheduled at GI for 10/17/18.

## 2018-10-17 ENCOUNTER — Ambulatory Visit
Admission: RE | Admit: 2018-10-17 | Discharge: 2018-10-17 | Disposition: A | Discharge status: Home / Self Care | Payer: Medicare HMO | Source: Ambulatory Visit | Attending: Neurology | Admitting: Neurology

## 2018-10-17 DIAGNOSIS — R200 Anesthesia of skin: Secondary | ICD-10-CM | POA: Diagnosis not present

## 2018-10-17 DIAGNOSIS — Z8669 Personal history of other diseases of the nervous system and sense organs: Secondary | ICD-10-CM

## 2018-10-17 DIAGNOSIS — G501 Atypical facial pain: Secondary | ICD-10-CM

## 2018-10-17 MED ORDER — GADOBENATE DIMEGLUMINE 529 MG/ML IV SOLN
10.0000 mL | Freq: Once | INTRAVENOUS | Status: AC | PRN
  Administered 2018-10-17: 10 mL via INTRAVENOUS

## 2018-10-19 ENCOUNTER — Telehealth: Payer: Self-pay | Admitting: *Deleted

## 2018-10-19 NOTE — Telephone Encounter (Signed)
-----   Message from Britt Bottom, MD sent at 10/18/2018  4:26 PM EST ----- Please let her know that the MRI of the brain shows some mild age-related changes but nothing worrisome.

## 2018-10-19 NOTE — Telephone Encounter (Signed)
I called pt. Relayed MRI brain results per Dr. Felecia Shelling. Pt verbalized understanding.

## 2018-11-10 ENCOUNTER — Encounter: Payer: Self-pay | Admitting: Internal Medicine

## 2018-11-10 ENCOUNTER — Ambulatory Visit: Payer: Medicare HMO | Admitting: Internal Medicine

## 2018-11-10 VITALS — BP 128/74 | HR 66 | Ht 64.0 in | Wt 127.0 lb

## 2018-11-10 DIAGNOSIS — I2510 Atherosclerotic heart disease of native coronary artery without angina pectoris: Secondary | ICD-10-CM

## 2018-11-10 DIAGNOSIS — Z789 Other specified health status: Secondary | ICD-10-CM

## 2018-11-10 DIAGNOSIS — E785 Hyperlipidemia, unspecified: Secondary | ICD-10-CM

## 2018-11-10 NOTE — Patient Instructions (Signed)
Medication Instructions:  Dr. Debara Pickett recommends that you take Aspirin 81mg  daily If you need a refill on your cardiac medications before your next appointment, please call your pharmacy.   Lab work: FASTING lab work to check cholesterol as soon as you are able If you have labs (blood work) drawn today and your tests are completely normal, you will receive your results only by: Marland Kitchen MyChart Message (if you have MyChart) OR . A paper copy in the mail If you have any lab test that is abnormal or we need to change your treatment, we will call you to review the results.  Testing/Procedures: NONE  Follow-Up: At [facility], you and your health needs are our priority.  As part of our continuing mission to provide you with exceptional heart care, we have created designated Provider Care Teams.  These Care Teams include your primary Cardiologist (physician) and Advanced Practice Providers (APPs -  Physician Assistants and Nurse Practitioners) who all work together to provide you with the care you need, when you need it. You will need a follow up appointment in 4 months.  You may see Dr. Debara Pickett or one of the following Advanced Practice Providers on your designated Care Team: Almyra Deforest, [place] . Fabian Sharp, PA-C  Any Other Special Instructions Will Be Listed Below (If Applicable).

## 2018-11-10 NOTE — Progress Notes (Signed)
OFFICE NOTE  Chief Complaint:  Routine follow-up  Primary Care Physician: Hoyt Koch, MD  HPI:  Wendy Patrick is a 72 y.o. female with a past medial history significant for mild coronary artery disease with a 40% circumflex stenosis in 2010, COPD, type 2 diabetes, hyperlipidemia and statin intolerance.  She was previously followed by Dr. Radford Pax however requested to switch to my care since her husband is a patient of mine.  We will try to see them simultaneously.  Recently she has been having some issues with diarrhea.  She is been undergoing treatment with antibiotics and care by Dr. Watt Climes.  She feels drained.  She is also congested and having difficulty with allergies and chronic cough.  She reports labile blood pressures from the upper 90s to 140s, but has no history of hypertension and is not on medication.  In fact she takes only aspirin.  She has been previously seen by one of our PharmD's in the lipid clinic to discuss her dyslipidemia and was offered ezetimibe, but did not take the medication due to cost issues.  Currently she reports some discomfort in the left shoulder arm and left back.  This is similar but much less significant to the discomfort she had in 2010.  She says she gets it occasionally.  Is not necessarily worse with exertion or relieved by rest and does not sound particularly anginal to me.  She did have a nuclear stress test last year which was completely normal.  11/10/2018  Wendy Patrick returns today for routine follow-up.  She does have a history of coronary artery disease with some circumflex stenosis of 40% in 2010, type 2 diabetes, hyperlipidemia and statin intolerance with a goal LDL less than 70.  Her main issues have been with some degree of neuropathy for which she is on gabapentin.  She says she is not tolerating this well and feels tired and is having some mood changes related to that.  Unfortunately she has been statin intolerant.  She has been on both  atorvastatin and rosuvastatin which caused leg cramps.  We had discussed PCSK9 inhibitor therapy in the past however had not pursued it.  Her most recent lab work from June 2018 showed total cholesterol 270, HDL 60, LDL 178 and triglycerides 158.  Although her diet sound somewhat atherogenic, there may be a component of FH.   PMHx:  Past Medical History:  Diagnosis Date  . COPD (chronic obstructive pulmonary disease) ([place])   . Coronary artery disease 08/2009   40% mid left circ by cath in [place], [place]  . Endometriosis    1986  . Gestational diabetes   . Granuloma annulare   . Hyperlipidemia   . Optic neuritis    2015  . Optic neuritis Novembr  . Vaginal anomaly    tears and bleeds easily    Past Surgical History:  Procedure Laterality Date  . ABDOMINAL HYSTERECTOMY    . CARDIAC CATHETERIZATION    . COLONOSCOPY    . EYE SURGERY    . MANDIBLE SURGERY    . MINOR HEMORRHOIDECTOMY    . TUBAL LIGATION    . UPPER GASTROINTESTINAL ENDOSCOPY      FAMHx:  Family History  Problem Relation Age of Onset  . Cancer Mother        Breast   . Breast cancer Mother   . Stroke Brother        open heart surgery   . Colon cancer Neg Hx   .  Esophageal cancer Neg Hx   . Rectal cancer Neg Hx   . Stomach cancer Neg Hx     SOCHx:   reports that she quit smoking about 18 years ago. Her smoking use included cigarettes. She has a 100.00 pack-year smoking history. She has never used smokeless tobacco. She reports that she drinks about 3.0 standard drinks of alcohol per week. She reports that she does not use drugs.  ALLERGIES:  Allergies  Allergen Reactions  . Statins     Leg cramps  . Crestor [Rosuvastatin] Other (See Comments)    Leg cramps  . Latex Rash    ROS: Pertinent items noted in HPI and remainder of comprehensive ROS otherwise negative.  HOME MEDS: Current Outpatient Medications on File Prior to Visit  Medication Sig Dispense Refill  . cholecalciferol (VITAMIN D) 1000  units tablet Take 1,000 Units by mouth daily.    Marland Kitchen gabapentin (NEURONTIN) 100 MG capsule Take one pill in the morning, one in the afternoon and two at bedtime 120 capsule 5  . KRILL OIL PO Take 250 mg by mouth daily.     No current facility-administered medications on file prior to visit.     LABS/IMAGING: No results found for this or any previous visit (from the past 48 hour(s)). No results found.  LIPID PANEL:    Component Value Date/Time   CHOL 270 (H) 05/04/2017 0739   TRIG 158 (H) 05/04/2017 0739   HDL 60 05/04/2017 0739   CHOLHDL 4.5 (H) 05/04/2017 0739   CHOLHDL 4 06/05/2016 0843   VLDL 35.8 06/05/2016 0843   LDLCALC 178 (H) 05/04/2017 0739     WEIGHTS: Wt Readings from Last 3 Encounters:  11/10/18 127 lb (57.6 kg)  10/06/18 125 lb (56.7 kg)  05/27/18 130 lb (59 kg)    VITALS: BP 128/74 (BP Location: Right Arm, Patient Position: Sitting, Cuff Size: Normal)   Pulse 66   Ht 5\' 4"  (1.626 m)   Wt 127 lb (57.6 kg)   BMI 21.80 kg/m   EXAM: General appearance: alert and no distress Neck: no carotid bruit, no JVD and thyroid not enlarged, symmetric, no tenderness/mass/nodules Lungs: clear to auscultation bilaterally Heart: regular rate and rhythm Abdomen: soft, non-tender; bowel sounds normal; no masses,  no organomegaly Extremities: extremities normal, atraumatic, no cyanosis or edema Pulses: 2+ and symmetric Skin: Skin color, texture, turgor normal. No rashes or lesions Neurologic: Grossly normal Psych: Pleasant  EKG: Normal sinus rhythm at 66, possible left atrial enlargement-personally reviewed  ASSESSMENT: 1. Mild nonobstructive coronary disease by cath (2010) 2. Atypical chest pain 3. Dyslipidemia-statin intolerant  PLAN: 1.   Wendy Patrick has nonobstructive coronary disease by cath in 2010 but has been statin intolerant.  She is failed to high potency statins secondary to myalgias.  Her goal LDL is less than 70.  She needs greater than 50% reduction in  LDL.  I would advise PCSK9 inhibitor therapy.  We will go ahead and recommend Repatha.  We will submit prior authorization as well as needs assessment paperwork.  She will need an updated lipid profile prior to that.   Follow-up in with me in 4 months.  Pixie Casino, MD, [facility] [place] Director of the Advanced Lipid Disorders &  Cardiovascular Risk Reduction Clinic Diplomate of the American Board of Clinical Lipidology Attending Cardiologist  Direct Dial: [phone]  Fax: [phone]  Website:  [email or web address] Wendy Patrick 11/10/2018, 8:34 AM

## 2018-11-19 DIAGNOSIS — Z789 Other specified health status: Secondary | ICD-10-CM | POA: Diagnosis not present

## 2018-11-19 DIAGNOSIS — E785 Hyperlipidemia, unspecified: Secondary | ICD-10-CM | POA: Diagnosis not present

## 2018-11-19 LAB — LIPID PANEL
CHOL/HDL RATIO: 4 ratio (ref 0.0–4.4)
Cholesterol, Total: 260 mg/dL — ABNORMAL HIGH (ref 100–199)
HDL: 65 mg/dL (ref >39)
LDL Calculated: 171 mg/dL — ABNORMAL HIGH (ref 0–99)
Triglycerides: 121 mg/dL (ref 0–149)
VLDL CHOLESTEROL CAL: 24 mg/dL (ref 5–40)

## 2018-11-22 ENCOUNTER — Telehealth: Payer: Self-pay | Admitting: Internal Medicine

## 2018-11-22 NOTE — Telephone Encounter (Signed)
Prior auth for Repatha submitted via [email or web address] (Key: [place])  Insurance: [employer]

## 2018-11-26 NOTE — Telephone Encounter (Signed)
Per Holland Falling, patient has been approved for Repatha [employer] from 81/15/72 - 12/01/19.

## 2018-11-26 NOTE — Telephone Encounter (Signed)
Patient's spouse notified she has been approved for [place]. Link to application provided via MyChart to spouse so it can be completed and returned to our office

## 2018-11-26 NOTE — Telephone Encounter (Signed)
LMTCB to discuss medication approval & patient assistance

## 2018-12-02 NOTE — Telephone Encounter (Signed)
Faxed patient assistance application for Repatha to [place]

## 2018-12-09 NOTE — Telephone Encounter (Signed)
Fax previously failed. Re-faxed application.

## 2018-12-14 NOTE — Telephone Encounter (Signed)
Per [employer], patient is not currently eligible for Safety Net patient assistance b/c the foundation provides support for patients that have no alternative financial support options. Wendy Patrick appears to be eligible for Medicare low income subsidy (LIS) or [employer] Help - phone: [phone] or www.medicare.gov  If she receives a denial from [employer], she can submit documentation to [place] stating such  [employer] phone # [phone]

## 2018-12-17 MED ORDER — EVOLOCUMAB 140 MG/ML ~~LOC~~ SOAJ: 1.0000 | SUBCUTANEOUS | 11 refills | Status: DC

## 2018-12-17 NOTE — Telephone Encounter (Signed)
Patient's husband called and explained reason for initial denial of [place] for [place] patient assistance. He states that he did not write combined income amounts on his and his wife's applications, which is why her letter stated she would qualify for LIS.  Notified him that I will need to send in Rx for Repatha to pharmacy to determine copay and suggested he call the [phone] provided to him via letter received for additional financial help for both he and wife. He voiced understanding. Rx(s) sent to pharmacy electronically.  Advised if co-pay is too costly, can resubmit application - would need to addend the income amounts

## 2018-12-17 NOTE — Addendum Note (Signed)
Addended by: Fidel Levy on: 12/17/2018 11:15 AM   Modules accepted: Orders

## 2018-12-24 NOTE — Telephone Encounter (Signed)
Per patient's husband, co-pay is $350/month which they cannot afford.   Called [employer] Safety Net - patient and husband will need to call [place] @ [phone] to see if they qualify for assistance thru this program OR [employer]. If they do not qualify, they can then reapply for [employer] Safety Net assistance.

## 2018-12-30 ENCOUNTER — Telehealth: Payer: Self-pay | Admitting: Internal Medicine

## 2018-12-30 NOTE — Telephone Encounter (Signed)
New message   [employer] is providing a grant to provide repatha. Need to confirm a diagnosis of the patient for the patient to receive this grant.  ID # for patient is [id number].  Please call this office to discuss.

## 2018-12-30 NOTE — Telephone Encounter (Signed)
Attempted to call [employer] and was notified via recording there were [phone] calls in front of mine. Opted to have [employer] return call via option provided

## 2018-12-31 NOTE — Telephone Encounter (Signed)
Attempted to call [employer]. There were 265 callers in front of my call.

## 2019-01-03 NOTE — Telephone Encounter (Signed)
Since unable to reach [employer], I sent message to patient via MyChart requesting assistance with contact person at this organization

## 2019-01-03 NOTE — Telephone Encounter (Signed)
Per MyChart message received from patient, she has received Repatha from the [employer] grant

## 2019-01-13 ENCOUNTER — Encounter: Payer: Self-pay | Admitting: Neurology

## 2019-01-13 ENCOUNTER — Other Ambulatory Visit: Payer: Self-pay

## 2019-01-13 ENCOUNTER — Ambulatory Visit: Payer: Medicare HMO | Admitting: Neurology

## 2019-01-13 VITALS — BP 100/55 | HR 74 | Ht 64.0 in | Wt 126.5 lb

## 2019-01-13 DIAGNOSIS — Z8669 Personal history of other diseases of the nervous system and sense organs: Secondary | ICD-10-CM

## 2019-01-13 DIAGNOSIS — R269 Unspecified abnormalities of gait and mobility: Secondary | ICD-10-CM | POA: Diagnosis not present

## 2019-01-13 DIAGNOSIS — R200 Anesthesia of skin: Secondary | ICD-10-CM | POA: Diagnosis not present

## 2019-01-13 DIAGNOSIS — G501 Atypical facial pain: Secondary | ICD-10-CM | POA: Diagnosis not present

## 2019-01-13 MED ORDER — DULOXETINE HCL 30 MG PO CPEP: 30.0000 mg | ORAL_CAPSULE | Freq: Every day | ORAL | 3 refills | Status: DC

## 2019-01-13 MED ORDER — MELOXICAM 7.5 MG PO TABS: 7.5000 mg | ORAL_TABLET | Freq: Every day | ORAL | 3 refills | Status: DC

## 2019-01-13 NOTE — Progress Notes (Signed)
Marland Kitchen   [facility]  PATIENT: Wendy Patrick DOB: 12/06/1945  REASON FOR VISIT: optic neuritis                                 HISTORICAL  CHIEF COMPLAINT:  Chief Complaint  Patient presents with  . Follow-up    RM 12 with husband. Last seen 10/06/2018. Feels vision/hearing getting worse. Has some eye pain in right eye. Reports from knees down to toes she has a very cold sensation/tingling. This is daily, intermittent. Still having right hip and leg pain.   Marland Kitchen gabapentin    Feels she is depressed and husband reports more agitation since being on medication.     HISTORY OF PRESENT ILLNESS:   Update 01/13/2019: She is still having pain inside the cheek and the throat on the right.   Ibuprofen was tried once and did not help much.    Gabapentin has not helped and made her feel more depressed.   She is sleeping slightly better some nights but other nights is doing the same.      She also has some right leg pain hat comes and goes. It starts in the hip and radiates down the right leg into the foot.  She has tried ibuprofen once and it helped a little bit.   She gets a tingly cold sensation down both legs.     She also notes urinary hesitancy.     MRI of the brain was personally reviewed and showed mild white matter changes c/w small vessel ischemic change and no issue along the 5th nerve.  The CRP, ESR and lipid panel were fine.   IMPRESSION: This MRI of the brain with and without contrast shows the following: 1.    Scattered T2/FLAIR hyperintense foci in the hemispheres.  The pattern is most consistent with chronic microvascular ischemic change.  Demyelination or vasculitis would be less likely. 2.    There is a normal enhancement pattern and there are no acute findings.   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  At the time she had dental  issues and had a tooth extraction.     She had a dry socket and needed a couple procedures.    Pain is constant with some fluctuation and she also notes a scratchy sensation in her throat.   She feel the  Cheek and the inside of the mouth and throat are mildly numb.      She is also experiencing pain elsewhere in the arms, legs and eyes over the last year.     Pain is worse with activity.    Rest helps slightly if she lays on her right side.  Ibuprofen helps the pain a a little bit.    Her legs feel a little weaker and she needs to rest more.   A few years ago she was much more active.      She has reduced vision on the left since an episode of optic neuritis.    She feels that eye is dry and scratchy.    She had optic neuritis on the left in 2016.   She felt vision got slightly better but then worsened again after cataract surgery.      She feels a little off balanced.  She is sleeping poorly.   She averages 6 hours sleep a night but wakes up several times most nights.       After her last visit I reviewed the MRI of the brain that showed multiple deep white matter, subcortical and periventricular foci in a pattern more consistent with chronic small vessel ischemic changes than to multiple sclerosis.   Her spinal fluid was not truly consistent with MS. She had normal IgG index and normal oligoclonal bands were not present (the report states 5 bands appeared more prominent).    Since her last visit, she denies any new neurologic symptom lasting over a day.   She had another eye exam last month and was told everything looked good.     She had left eye pain x 1 hour but no change in vision a couplel months ago.     She feels her gait is mildly off and she veers to one side or the other.   THis has been going on about a year.     She often has ringing in her ears.     The vertigo is translational where she feels like she is being pushed a little bit to the one side (or the other). She notes this most when  she is dancing and after a spin feels like she is being pushed one way. However, she has also  noted this while walking down a hallway.  These episodes are occurring less frequently than they used to. Occasionally she has tinnitus  REVIEW OF SYSTEMS:  Constitutional: No fevers, chills, sweats, or change in appetite.   Decreased energy is noted Eyes: No visual changes, double vision, eye pain Ear, nose and throat: No hearing loss, ear pain, nasal congestion, sore throat.   She has a spinning sensation and tinnitus.   Cardiovascular: No chest pain, palpitations Respiratory:  No shortness of breath at rest or with exertion.   No wheezes GastrointestinaI: No nausea, vomiting, diarrhea, abdominal pain, fecal incontinence Genitourinary:  as above. Musculoskeletal:  No neck pain, back pain Integumentary: No rash, pruritus, skin lesions Neurological: as above Psychiatric: No depression at this time.  No anxiety Endocrine: No palpitations, diaphoresis, change in appetite, change in weigh or increased thirst Hematologic/Lymphatic:  No anemia, purpura, petechiae. Allergic/Immunologic: No itchy/runny eyes, nasal congestion, recent allergic reactions, rashes  ALLERGIES: Allergies  Allergen Reactions  . Statins     Leg cramps  . Crestor [Rosuvastatin] Other (See Comments)    Leg cramps  . Latex Rash    HOME MEDICATIONS: Outpatient Medications Prior to Visit  Medication Sig Dispense Refill  . aspirin EC 81 MG tablet Take 81 mg by mouth daily.    . cholecalciferol (VITAMIN D) 1000 units tablet Take 1,000 Units by mouth daily.    . Evolocumab (REPATHA SURECLICK) 892 MG/ML SOAJ Inject 1 Dose into the skin every 14 (fourteen) days. 2 pen 11  . gabapentin (NEURONTIN) 100 MG capsule Take one pill in the morning, one in the afternoon and two at bedtime 120 capsule 5  . KRILL OIL PO Take 250 mg by mouth daily.     No facility-administered medications prior to visit.     PAST MEDICAL HISTORY: Past  Medical History:  Diagnosis Date  . COPD (chronic obstructive pulmonary disease) ([place])   . Coronary artery disease 08/2009   40% mid left circ by cath in [place], [place]  . Endometriosis    1986  . Gestational diabetes   . Granuloma annulare   .  Hyperlipidemia   . Optic neuritis    2015  . Optic neuritis Novembr  . Vaginal anomaly    tears and bleeds easily    PAST SURGICAL HISTORY: Past Surgical History:  Procedure Laterality Date  . ABDOMINAL HYSTERECTOMY    . CARDIAC CATHETERIZATION    . COLONOSCOPY    . EYE SURGERY    . MANDIBLE SURGERY    . MINOR HEMORRHOIDECTOMY    . TUBAL LIGATION    . UPPER GASTROINTESTINAL ENDOSCOPY      FAMILY HISTORY: Family History  Problem Relation Age of Onset  . Cancer Mother        Breast   . Breast cancer Mother   . Stroke Brother        open heart surgery   . Colon cancer Neg Hx   . Esophageal cancer Neg Hx   . Rectal cancer Neg Hx   . Stomach cancer Neg Hx     SOCIAL HISTORY:  Social History   Socioeconomic History  . Marital status: Married    Spouse name: Iona Beard  . Number of children: 1  . Years of education: GED  . Highest education level: Not on file  Occupational History  . Occupation: Retired  Scientific laboratory technician  . Financial resource strain: Not hard at all  . Food insecurity:    Worry: Never true    Inability: Never true  . Transportation needs:    Medical: No    Non-medical: No  Tobacco Use  . Smoking status: Former Smoker    Packs/day: 2.00    Years: 50.00    Pack years: 100.00    Types: Cigarettes    Last attempt to quit: 02/05/2000    Years since quitting: 18.9  . Smokeless tobacco: Never Used  . Tobacco comment: Educated LDCT for lung Ca to discuss with MD if interested  Substance and Sexual Activity  . Alcohol use: Yes    Alcohol/week: 3.0 standard drinks    Types: 3 Glasses of wine per week    Comment: wine with dinner   . Drug use: No  . Sexual activity: Never    Partners: Male  Lifestyle  .  Physical activity:    Days per week: 4 days    Minutes per session: 40 min  . Stress: To some extent  Relationships  . Social connections:    Talks on phone: More than three times a week    Gets together: More than three times a week    Attends religious service: More than 4 times per year    Active member of club or organization: Yes    Attends meetings of clubs or organizations: More than 4 times per year    Relationship status: Married  . Intimate partner violence:    Fear of current or ex partner: No    Emotionally abused: No    Physically abused: No    Forced sexual activity: No  Other Topics Concern  . Not on file  Social History Narrative   Lives w/ husband    Caffeine use: Coffee daily   Right handed      PHYSICAL EXAM  Vitals:   01/13/19 1541  BP: (!) 100/55  Pulse: 74  SpO2: 98%  Weight: 126 lb 8 oz (57.4 kg)  Height: _0  (1.626 m)    Body mass index is 21.71 kg/m.   General: The patient is well-developed and well-nourished and in no acute distress   Neck: The neck has  good ROM.  The neck is nontender.   Right trochanteric bursa is tender.  Neurologic Exam  Mental status: The patient is alert and oriented x 3 at the time of the examination. The patient has apparent normal recent and remote memory, with an apparently normal attention span and concentration ability.   Speech is normal.  Cranial nerves: Extraocular movements are full.  Color vision is desaturated out of the left eye.  She reports reduced V2 and V3 sensation in face.   Trapezius and sternocleidomastoid strength is normal. No dysarthria is noted.  The tongue is midline, and the patient has symmetric elevation of the soft palate. No obvious hearing deficits are noted.  Motor:  Muscle bulk and tone are normal. Strength is  5 / 5 in all 4 extremities.   Sensory: Sensory testing is intact to touch in all 4 extremities.   Gait and station: Station and gait are normal. Tandem gait is normal for  age (minimaly wide). Romberg is negative.      Reflexes: Deep tendon reflexes are symmetric and normal bilaterally.      DIAGNOSTIC DATA (LABS, IMAGING, TESTING) - I reviewed patient records, labs, notes, testing and imaging myself where available.         ASSESSMENT AND PLAN  Atypical facial pain  Facial numbness  Gait disturbance  History of optic neuritis   1.   Etiology of pain is unclear.   Notable sclerosis is unlikely, given MRI pattern to more c/w chronic microvascular ischemic changes and age at onset of optic neuritis 2.   Remain active and exercise as tolerated.   3.   Duloxetine 30 mg daily for neuropathic pain and meloxicam 7.5 mg daily for pain.  If back and leg pain do not improve consider.   4.   Return in 3 months or sooner if there are new or worsening neurologic symptoms.  Richard A. Felecia Shelling, MD, PhD 2/76/1848, 5:92 PM Certified in Neurology, Clinical Neurophysiology, Sleep Medicine, Pain Medicine and Neuroimaging  [facility] [address], [place], [place] [address] [phone]

## 2019-02-01 ENCOUNTER — Encounter: Payer: Self-pay | Admitting: Internal Medicine

## 2019-02-01 ENCOUNTER — Other Ambulatory Visit (INDEPENDENT_AMBULATORY_CARE_PROVIDER_SITE_OTHER): Payer: Medicare HMO

## 2019-02-01 ENCOUNTER — Ambulatory Visit (INDEPENDENT_AMBULATORY_CARE_PROVIDER_SITE_OTHER): Payer: Medicare HMO | Admitting: Internal Medicine

## 2019-02-01 VITALS — BP 110/60 | HR 79 | Temp 97.5°F | Ht 64.0 in | Wt 123.0 lb

## 2019-02-01 DIAGNOSIS — E559 Vitamin D deficiency, unspecified: Secondary | ICD-10-CM | POA: Diagnosis not present

## 2019-02-01 DIAGNOSIS — E538 Deficiency of other specified B group vitamins: Secondary | ICD-10-CM | POA: Diagnosis not present

## 2019-02-01 DIAGNOSIS — Z789 Other specified health status: Secondary | ICD-10-CM

## 2019-02-01 DIAGNOSIS — R5383 Other fatigue: Secondary | ICD-10-CM | POA: Diagnosis not present

## 2019-02-01 DIAGNOSIS — G501 Atypical facial pain: Secondary | ICD-10-CM

## 2019-02-01 DIAGNOSIS — E7800 Pure hypercholesterolemia, unspecified: Secondary | ICD-10-CM

## 2019-02-01 DIAGNOSIS — J449 Chronic obstructive pulmonary disease, unspecified: Secondary | ICD-10-CM

## 2019-02-01 DIAGNOSIS — E2839 Other primary ovarian failure: Secondary | ICD-10-CM

## 2019-02-01 DIAGNOSIS — Z0000 Encounter for general adult medical examination without abnormal findings: Secondary | ICD-10-CM

## 2019-02-01 LAB — COMPREHENSIVE METABOLIC PANEL
ALBUMIN: 4.1 g/dL (ref 3.5–5.2)
ALT: 11 U/L (ref 0–35)
AST: 16 U/L (ref 0–37)
Alkaline Phosphatase: 65 U/L (ref 39–117)
BILIRUBIN TOTAL: 0.4 mg/dL (ref 0.2–1.2)
BUN: 19 mg/dL (ref 6–23)
CALCIUM: 9.1 mg/dL (ref 8.4–10.5)
CO2: 25 meq/L (ref 19–32)
Chloride: 106 mEq/L (ref 96–112)
Creatinine, Ser: 0.91 mg/dL (ref 0.40–1.20)
GFR: 60.36 mL/min (ref >60.00)
GLUCOSE: 88 mg/dL (ref 70–99)
Potassium: 4 mEq/L (ref 3.5–5.1)
SODIUM: 142 meq/L (ref 135–145)
Total Protein: 6.4 g/dL (ref 6.0–8.3)

## 2019-02-01 LAB — CBC
HCT: 44.8 % (ref 36.0–46.0)
Hemoglobin: 14.7 g/dL (ref 12.0–15.0)
MCHC: 32.8 g/dL (ref 30.0–36.0)
MCV: 84.6 fl (ref 78.0–100.0)
Platelets: 292 10*3/uL (ref 150.0–400.0)
RBC: 5.3 Mil/uL — AB (ref 3.87–5.11)
RDW: 14.8 % (ref 11.5–15.5)
WBC: 6.6 10*3/uL (ref 4.0–10.5)

## 2019-02-01 LAB — VITAMIN B12: VITAMIN B 12: 521 pg/mL (ref 211–911)

## 2019-02-01 LAB — VITAMIN D 25 HYDROXY (VIT D DEFICIENCY, FRACTURES): VITD: 59.52 ng/mL (ref 30.00–100.00)

## 2019-02-01 LAB — TSH: TSH: 1.85 u[IU]/mL (ref 0.35–4.50)

## 2019-02-01 NOTE — Patient Instructions (Addendum)
You can take zyrtec (cetirizine) or claritin (loratadine).   We are checking the labs today.  Health Maintenance, Female Adopting a healthy lifestyle and getting preventive care can go a long way to promote health and wellness. Talk with your health care provider about what schedule of regular examinations is right for you. This is a good chance for you to check in with your provider about disease prevention and staying healthy. In between checkups, there are plenty of things you can do on your own. Experts have done a lot of research about which lifestyle changes and preventive measures are most likely to keep you healthy. Ask your health care provider for more information. Weight and diet Eat a healthy diet  Be sure to include plenty of vegetables, fruits, low-fat dairy products, and lean protein.  Do not eat a lot of foods high in solid fats, added sugars, or salt.  Get regular exercise. This is one of the most important things you can do for your health. ? Most adults should exercise for at least 150 minutes each week. The exercise should increase your heart rate and make you sweat (moderate-intensity exercise). ? Most adults should also do strengthening exercises at least twice a week. This is in addition to the moderate-intensity exercise. Maintain a healthy weight  Body mass index (BMI) is a measurement that can be used to identify possible weight problems. It estimates body fat based on height and weight. Your health care provider can help determine your BMI and help you achieve or maintain a healthy weight.  For females 13 years of age and older: ? A BMI below 18.5 is considered underweight. ? A BMI of 18.5 to 24.9 is normal. ? A BMI of 25 to 29.9 is considered overweight. ? A BMI of 30 and above is considered obese. Watch levels of cholesterol and blood lipids  You should start having your blood tested for lipids and cholesterol at 75 years of age, then have this test every 5  years.  You may need to have your cholesterol levels checked more often if: ? Your lipid or cholesterol levels are high. ? You are older than 76 years of age. ? You are at high risk for heart disease. Cancer screening Lung Cancer  Lung cancer screening is recommended for adults 22-57 years old who are at high risk for lung cancer because of a history of smoking.  A yearly low-dose CT scan of the lungs is recommended for people who: ? Currently smoke. ? Have quit within the past 15 years. ? Have at least a 30-pack-year history of smoking. A pack year is smoking an average of one pack of cigarettes a day for 1 year.  Yearly screening should continue until it has been 15 years since you quit.  Yearly screening should stop if you develop a health problem that would prevent you from having lung cancer treatment. Breast Cancer  Practice breast self-awareness. This means understanding how your breasts normally appear and feel.  It also means doing regular breast self-exams. Let your health care provider know about any changes, no matter how small.  If you are in your 20s or 30s, you should have a clinical breast exam (CBE) by a health care provider every 1-3 years as part of a regular health exam.  If you are 69 or older, have a CBE every year. Also consider having a breast X-ray (mammogram) every year.  If you have a family history of breast cancer, talk to your  health care provider about genetic screening.  If you are at high risk for breast cancer, talk to your health care provider about having an MRI and a mammogram every year.  Breast cancer gene (BRCA) assessment is recommended for women who have family members with BRCA-related cancers. BRCA-related cancers include: ? Breast. ? Ovarian. ? Tubal. ? Peritoneal cancers.  Results of the assessment will determine the need for genetic counseling and BRCA1 and BRCA2 testing. Cervical Cancer Your health care provider may recommend  that you be screened regularly for cancer of the pelvic organs (ovaries, uterus, and vagina). This screening involves a pelvic examination, including checking for microscopic changes to the surface of your cervix (Pap test). You may be encouraged to have this screening done every 3 years, beginning at age 48.  For women ages 26-65, health care providers may recommend pelvic exams and Pap testing every 3 years, or they may recommend the Pap and pelvic exam, combined with testing for human papilloma virus (HPV), every 5 years. Some types of HPV increase your risk of cervical cancer. Testing for HPV may also be done on women of any age with unclear Pap test results.  Other health care providers may not recommend any screening for nonpregnant women who are considered low risk for pelvic cancer and who do not have symptoms. Ask your health care provider if a screening pelvic exam is right for you.  If you have had past treatment for cervical cancer or a condition that could lead to cancer, you need Pap tests and screening for cancer for at least 20 years after your treatment. If Pap tests have been discontinued, your risk factors (such as having a new sexual partner) need to be reassessed to determine if screening should resume. Some women have medical problems that increase the chance of getting cervical cancer. In these cases, your health care provider may recommend more frequent screening and Pap tests. Colorectal Cancer  This type of cancer can be detected and often prevented.  Routine colorectal cancer screening usually begins at 75 years of age and continues through 75 years of age.  Your health care provider may recommend screening at an earlier age if you have risk factors for colon cancer.  Your health care provider may also recommend using home test kits to check for hidden blood in the stool.  A small camera at the end of a tube can be used to examine your colon directly (sigmoidoscopy or  colonoscopy). This is done to check for the earliest forms of colorectal cancer.  Routine screening usually begins at age 20.  Direct examination of the colon should be repeated every 5-10 years through 75 years of age. However, you may need to be screened more often if early forms of precancerous polyps or small growths are found. Skin Cancer  Check your skin from head to toe regularly.  Tell your health care provider about any new moles or changes in moles, especially if there is a change in a mole's shape or color.  Also tell your health care provider if you have a mole that is larger than the size of a pencil eraser.  Always use sunscreen. Apply sunscreen liberally and repeatedly throughout the day.  Protect yourself by wearing long sleeves, pants, a wide-brimmed hat, and sunglasses whenever you are outside. Heart disease, diabetes, and high blood pressure  High blood pressure causes heart disease and increases the risk of stroke. High blood pressure is more likely to develop in: ? People  who have blood pressure in the high end of the normal range (130-139/85-89 mm Hg). ? People who are overweight or obese. ? People who are African American.  If you are 69-81 years of age, have your blood pressure checked every 3-5 years. If you are 41 years of age or older, have your blood pressure checked every year. You should have your blood pressure measured twice-once when you are at a hospital or clinic, and once when you are not at a hospital or clinic. Record the average of the two measurements. To check your blood pressure when you are not at a hospital or clinic, you can use: ? An automated blood pressure machine at a pharmacy. ? A home blood pressure monitor.  If you are between 60 years and 16 years old, ask your health care provider if you should take aspirin to prevent strokes.  Have regular diabetes screenings. This involves taking a blood sample to check your fasting blood sugar  level. ? If you are at a normal weight and have a low risk for diabetes, have this test once every three years after 75 years of age. ? If you are overweight and have a high risk for diabetes, consider being tested at a younger age or more often. Preventing infection Hepatitis B  If you have a higher risk for hepatitis B, you should be screened for this virus. You are considered at high risk for hepatitis B if: ? You were born in a country where hepatitis B is common. Ask your health care provider which countries are considered high risk. ? Your parents were born in a high-risk country, and you have not been immunized against hepatitis B (hepatitis B vaccine). ? You have HIV or AIDS. ? You use needles to inject street drugs. ? You live with someone who has hepatitis B. ? You have had sex with someone who has hepatitis B. ? You get hemodialysis treatment. ? You take certain medicines for conditions, including cancer, organ transplantation, and autoimmune conditions. Hepatitis C  Blood testing is recommended for: ? Everyone born from [birth year] through 1967. ? Anyone with known risk factors for hepatitis C. Sexually transmitted infections (STIs)  You should be screened for sexually transmitted infections (STIs) including gonorrhea and chlamydia if: ? You are sexually active and are younger than 76 years of age. ? You are older than 75 years of age and your health care provider tells you that you are at risk for this type of infection. ? Your sexual activity has changed since you were last screened and you are at an increased risk for chlamydia or gonorrhea. Ask your health care provider if you are at risk.  If you do not have HIV, but are at risk, it may be recommended that you take a prescription medicine daily to prevent HIV infection. This is called pre-exposure prophylaxis (PrEP). You are considered at risk if: ? You are sexually active and do not regularly use condoms or know the HIV status  of your partner(s). ? You take drugs by injection. ? You are sexually active with a partner who has HIV. Talk with your health care provider about whether you are at high risk of being infected with HIV. If you choose to begin PrEP, you should first be tested for HIV. You should then be tested every 3 months for as long as you are taking PrEP. Pregnancy  If you are premenopausal and you may become pregnant, ask your health care provider about preconception  counseling.  If you may become pregnant, take 400 to 800 micrograms (mcg) of folic acid every day.  If you want to prevent pregnancy, talk to your health care provider about birth control (contraception). Osteoporosis and menopause  Osteoporosis is a disease in which the bones lose minerals and strength with aging. This can result in serious bone fractures. Your risk for osteoporosis can be identified using a bone density scan.  If you are 41 years of age or older, or if you are at risk for osteoporosis and fractures, ask your health care provider if you should be screened.  Ask your health care provider whether you should take a calcium or vitamin D supplement to lower your risk for osteoporosis.  Menopause may have certain physical symptoms and risks.  Hormone replacement therapy may reduce some of these symptoms and risks. Talk to your health care provider about whether hormone replacement therapy is right for you. Follow these instructions at home:  Schedule regular health, dental, and eye exams.  Stay current with your immunizations.  Do not use any tobacco products including cigarettes, chewing tobacco, or electronic cigarettes.  If you are pregnant, do not drink alcohol.  If you are breastfeeding, limit how much and how often you drink alcohol.  Limit alcohol intake to no more than 1 drink per day for nonpregnant women. One drink equals 12 ounces of beer, 5 ounces of wine, or 1 ounces of hard liquor.  Do not use street  drugs.  Do not share needles.  Ask your health care provider for help if you need support or information about quitting drugs.  Tell your health care provider if you often feel depressed.  Tell your health care provider if you have ever been abused or do not feel safe at home. This information is not intended to replace advice given to you by your health care provider. Make sure you discuss any questions you have with your health care provider. Document Released: 06/02/2011 Document Revised: 04/24/2016 Document Reviewed: 08/21/2015 Elsevier Interactive Patient Education  2019 [employer].

## 2019-02-01 NOTE — Progress Notes (Signed)
   Subjective:   Patient ID: Wendy Patrick, female    DOB: [birth year], 73 y.o.   MRN: [id number]  HPI The patient is a 73 YO female coming in for physical.   PMH, [place], social history reviewed and updated  Review of Systems  Constitutional: Negative.   HENT: Negative.   Eyes: Negative.   Respiratory: Negative for cough, chest tightness and shortness of breath.   Cardiovascular: Negative for chest pain, palpitations and leg swelling.  Gastrointestinal: Negative for abdominal distention, abdominal pain, constipation, diarrhea, nausea and vomiting.  Musculoskeletal: Positive for arthralgias and myalgias. Negative for back pain, gait problem, joint swelling, neck pain and neck stiffness.  Skin: Negative.   Neurological: Negative.   Psychiatric/Behavioral: Negative.     Objective:  Physical Exam Constitutional:      Appearance: She is well-developed.  HENT:     Head: Normocephalic and atraumatic.  Neck:     Musculoskeletal: Normal range of motion.  Cardiovascular:     Rate and Rhythm: Normal rate and regular rhythm.  Pulmonary:     Effort: Pulmonary effort is normal. No respiratory distress.     Breath sounds: Normal breath sounds. No wheezing or rales.  Abdominal:     General: Bowel sounds are normal. There is no distension.     Palpations: Abdomen is soft.     Tenderness: There is no abdominal tenderness. There is no rebound.  Musculoskeletal:        General: Tenderness present.  Skin:    General: Skin is warm and dry.  Neurological:     Mental Status: She is alert and oriented to person, place, and time.     Coordination: Coordination normal.     Vitals:   02/01/19 0752  BP: 110/60  Pulse: 79  Temp: (!) 97.5 F (36.4 C)  TempSrc: Oral  SpO2: 96%  Weight: 123 lb (55.8 kg)  Height: 5\' 4"  (1.626 m)    Assessment & Plan:

## 2019-02-01 NOTE — Assessment & Plan Note (Signed)
Taking repatha and not due for recheck yet.

## 2019-02-01 NOTE — Assessment & Plan Note (Signed)
Seeing neurology and taking cymbalta 30 mg daily and meloxicam 7.5 mg daily. Some improvement.

## 2019-02-01 NOTE — Assessment & Plan Note (Signed)
Taking repatha since January and not due for lipid recheck today.

## 2019-02-01 NOTE — Assessment & Plan Note (Signed)
Flu shot up to date. Pneumonia complete. Shingrix counseled. Tetanus up to date. Colonoscopy up to date. Mammogram up to date, pap smear aged out and dexa ordered. Counseled about sun safety and mole surveillance. Counseled about the dangers of distracted driving. Given 10 year screening recommendations.

## 2019-02-01 NOTE — Assessment & Plan Note (Signed)
Having more SOB but declines PFTs. Declines inhaler. Still line dancing but having to stop more to rest.

## 2019-04-06 ENCOUNTER — Other Ambulatory Visit: Payer: Self-pay | Admitting: Neurology

## 2019-05-06 ENCOUNTER — Other Ambulatory Visit: Payer: Self-pay | Admitting: Neurology

## 2019-05-19 ENCOUNTER — Telehealth: Payer: Self-pay

## 2019-05-19 NOTE — Telephone Encounter (Signed)
Unable to get in contact with the patient to convert their office appt with Amy on 05/23/2019 into a mychart video visit. I left a voicemail asking the patient to return my call. Office number was provided.    If patient calls back please convert their office visit into a mychart video visit.

## 2019-05-23 ENCOUNTER — Ambulatory Visit: Payer: Medicare HMO | Admitting: Family Medicine

## 2019-06-01 ENCOUNTER — Ambulatory Visit: Payer: Medicare HMO | Admitting: Family Medicine

## 2019-06-01 ENCOUNTER — Encounter: Payer: Self-pay | Admitting: Family Medicine

## 2019-06-01 ENCOUNTER — Other Ambulatory Visit: Payer: Self-pay

## 2019-06-01 VITALS — BP 116/68 | HR 80 | Temp 97.5°F | Ht 64.0 in | Wt 124.0 lb

## 2019-06-01 DIAGNOSIS — G8929 Other chronic pain: Secondary | ICD-10-CM

## 2019-06-01 DIAGNOSIS — M5441 Lumbago with sciatica, right side: Secondary | ICD-10-CM | POA: Diagnosis not present

## 2019-06-01 DIAGNOSIS — G501 Atypical facial pain: Secondary | ICD-10-CM

## 2019-06-01 DIAGNOSIS — R269 Unspecified abnormalities of gait and mobility: Secondary | ICD-10-CM

## 2019-06-01 MED ORDER — DULOXETINE HCL 30 MG PO CPEP: 30.0000 mg | ORAL_CAPSULE | Freq: Every day | ORAL | 3 refills | Status: DC

## 2019-06-01 NOTE — Progress Notes (Signed)
I have read the note, and I agree with the clinical assessment and plan.  Richard A. Sater, MD, PhD, FAAN Certified in Neurology, Clinical Neurophysiology, Sleep Medicine, Pain Medicine and Neuroimaging  [facility] [address], [address] [place], [place] [address] [phone]  

## 2019-06-01 NOTE — Patient Instructions (Signed)
Please restart duloxetine 30mg  daily  Only take meloxicam 15mg  AS NEEDED  Follow up annually, sooner for worsening neurologic symptoms.    Chronic Back Pain When back pain lasts longer than 3 months, it is called chronic back pain.The cause of your back pain may not be known. Some common causes include:  Wear and tear (degenerative disease) of the bones, ligaments, or disks in your back.  Inflammation and stiffness in your back (arthritis). People who have chronic back pain often go through certain periods in which the pain is more intense (flare-ups). Many people can learn to manage the pain with home care. Follow these instructions at home: Pay attention to any changes in your symptoms. Take these actions to help with your pain: Activity   Avoid bending and other activities that make the problem worse.  Maintain a proper position when standing or sitting: ? When standing, keep your upper back and neck straight, with your shoulders pulled back. Avoid slouching. ? When sitting, keep your back straight and relax your shoulders. Do not round your shoulders or pull them backward.  Do not sit or stand in one place for long periods of time.  Take brief periods of rest throughout the day. This will reduce your pain. Resting in a lying or standing position is usually better than sitting to rest.  When you are resting for longer periods, mix in some mild activity or stretching between periods of rest. This will help to prevent stiffness and pain.  Get regular exercise. Ask your health care provider what activities are safe for you.  Do not lift anything that is heavier than 10 lb (4.5 kg). Always use proper lifting technique, which includes: ? Bending your knees. ? Keeping the load close to your body. ? Avoiding twisting.  Sleep on a firm mattress in a comfortable position. Try lying on your side with your knees slightly bent. If you lie on your back, put a pillow under your knees.  Managing pain  If directed, apply ice to the painful area. Your health care provider may recommend applying ice during the first 24-48 hours after a flare-up begins. ? Put ice in a plastic bag. ? Place a towel between your skin and the bag. ? Leave the ice on for 20 minutes, 2-3 times per day.  If directed, apply heat to the affected area as often as told by your health care provider. Use the heat source that your health care provider recommends, such as a moist heat pack or a heating pad. ? Place a towel between your skin and the heat source. ? Leave the heat on for 20-30 minutes. ? Remove the heat if your skin turns bright red. This is especially important if you are unable to feel pain, heat, or cold. You may have a greater risk of getting burned.  Try soaking in a warm tub.  Take over-the-counter and prescription medicines only as told by your health care provider.  Keep all follow-up visits as told by your health care provider. This is important. Contact a health care provider if:  You have pain that is not relieved with rest or medicine. Get help right away if:  You have weakness or numbness in one or both of your legs or feet.  You have trouble controlling your bladder or your bowels.  You have nausea or vomiting.  You have pain in your abdomen.  You have shortness of breath or you faint. This information is not intended to replace advice  given to you by your health care provider. Make sure you discuss any questions you have with your health care provider. Document Released: 12/25/2004 Document Revised: 03/10/2019 Document Reviewed: 05/27/2017 Elsevier Patient Education  2020 [employer].

## 2019-06-01 NOTE — Progress Notes (Signed)
PATIENT: Wendy Patrick DOB: [birth year]  REASON FOR VISIT: follow up HISTORY FROM: patient  Chief Complaint  Patient presents with  . Follow-up    4 month f/u. Husband present. Rm 8. Patient stated that she has ran out of cymbalta but she mentioned that she has been fine since being off of it.      HISTORY OF PRESENT ILLNESS: Today 06/01/19 Wendy Patrick is a 73 y.o. female here today for follow up. She was seen in 01/2019 by Dr Felecia Shelling who started her on duloxetine 41m and meloxicam 159mfor atypical facial pain and back/leg pain. She has noted some improvement in her facial pain. Her back and leg pain have nearly resolved.  Unfortunately, she ran out of her duloxetine about a week ago.  She states that she had several days of diarrhea and did not feel well.  Over the past 2 days she has felt pretty good.  She also notes some increased acid reflux since taking meloxicam daily.  She does not always take this with food.  She does continue to have some imbalance.  She is not requiring any assistive devices at this time.  She denies falls.  She is working with physical therapy through [facility]   HISTORY: (copied from Dr SaGarth Bignessote on 01/13/2019)  She is still having pain inside the cheek and the throat on the right.   Ibuprofen was tried once and did not help much.    Gabapentin has not helped and made her feel more depressed.   She is sleeping slightly better some nights but other nights is doing the same.      She also has some right leg pain hat comes and goes. It starts in the hip and radiates down the right leg into the foot.  She has tried ibuprofen once and it helped a little bit.   She gets a tingly cold sensation down both legs.     She also notes urinary hesitancy.     MRI of the brain was personally reviewed and showed mild white matter changes c/w small vessel ischemic change and no issue along the 5th nerve.  The CRP, ESR and lipid panel were fine.   IMPRESSION: This MRI of  the brain with and without contrast shows the following: 1. Scattered T2/FLAIR hyperintense foci in the hemispheres. The pattern is most consistent with chronic microvascular ischemic change. Demyelination or vasculitis would be less likely. 2. There is a normal enhancement pattern and there are no acute findings.   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  At the time she had dental issues and had a tooth extraction.     She had a dry socket and needed a couple procedures.    Pain is constant with some fluctuation and she also notes a scratchy sensation in her throat.   She feel the  Cheek and the inside of the mouth and throat are mildly numb.      She is also experiencing pain elsewhere in the arms, legs and eyes over the last year.     Pain is worse with activity.    Rest helps slightly if she lays on her right side.  Ibuprofen helps the pain a a little bit.    Her legs feel a little weaker and she needs to rest more.   A  few years ago she was much more active.      She has reduced vision on the left since an episode of optic neuritis.    She feels that eye is dry and scratchy.    She had optic neuritis on the left in 2016.   She felt vision got slightly better but then worsened again after cataract surgery.      She feels a little off balanced.      She is sleeping poorly.   She averages 6 hours sleep a night but wakes up several times most nights.       After her last visit I reviewed the MRI of the brain that showed multiple deep white matter, subcortical and periventricular foci in a pattern more consistent with chronic small vessel ischemic changes than to multiple sclerosis.   Her spinal fluid was not truly consistent with MS. She had normal IgG index and normal oligoclonal bands were not present (the report states 5 bands appeared more prominent).     Since her last visit, she denies any new neurologic symptom lasting over a day.   She had another eye exam last month and was told everything looked good.     She had left eye pain x 1 hour but no change in vision a couplel months ago.     She feels her gait is mildly off and she veers to one side or the other.   THis has been going on about a year.     She often has ringing in her ears.     The vertigo is translational where she feels like she is being pushed a little bit to the one side (or the other). She notes this most when she is dancing and after a spin feels like she is being pushed one way. However, she has also  noted this while walking down a hallway.  These episodes are occurring less frequently than they used to. Occasionally she has tinnitus   REVIEW OF SYSTEMS: Out of a complete 14 system review of symptoms, the patient complains only of the following symptoms, back pain, allergies, facial/dental pain, acid reflux, gait imbalance and all other reviewed systems are negative.  ALLERGIES: Allergies  Allergen Reactions  . Statins     Leg cramps  . Crestor [Rosuvastatin] Other (See Comments)    Leg cramps  . Latex Rash    HOME MEDICATIONS: Outpatient Medications Prior to Visit  Medication Sig Dispense Refill  . aspirin EC 81 MG tablet Take 81 mg by mouth daily.    . cholecalciferol (VITAMIN D) 1000 units tablet Take 1,000 Units by mouth daily.    . Evolocumab (REPATHA SURECLICK) 294 MG/ML SOAJ Inject 1 Dose into the skin every 14 (fourteen) days. 2 pen 11  . KRILL OIL PO Take 250 mg by mouth daily.    . meloxicam (MOBIC) 7.5 MG tablet TAKE ONE TABLET BY MOUTH DAILY 90 tablet 2  . DULoxetine (CYMBALTA) 30 MG capsule TAKE ONE CAPSULE BY MOUTH DAILY (Patient not taking: Reported on 06/01/2019) 30 capsule 5   No facility-administered medications prior to visit.     PAST MEDICAL HISTORY: Past Medical History:  Diagnosis Date  . COPD (chronic obstructive pulmonary disease)  ([place])   . Coronary artery disease 08/2009   40% mid left circ by cath in [place], [place]  . Endometriosis    1986  . Gestational diabetes   . Granuloma annulare   . Hyperlipidemia   . Optic  neuritis    2015  . Optic neuritis [place]  . Vaginal anomaly    tears and bleeds easily    PAST SURGICAL HISTORY: Past Surgical History:  Procedure Laterality Date  . ABDOMINAL HYSTERECTOMY    . CARDIAC CATHETERIZATION    . COLONOSCOPY    . EYE SURGERY    . MANDIBLE SURGERY    . MINOR HEMORRHOIDECTOMY    . TUBAL LIGATION    . UPPER GASTROINTESTINAL ENDOSCOPY      FAMILY HISTORY: Family History  Problem Relation Age of Onset  . Cancer Mother        Breast   . Breast cancer Mother   . Stroke Brother        open heart surgery   . Colon cancer Neg Hx   . Esophageal cancer Neg Hx   . Rectal cancer Neg Hx   . Stomach cancer Neg Hx     SOCIAL HISTORY: Social History   Socioeconomic History  . Marital status: Married    Spouse name: Iona Beard  . Number of children: 1  . Years of education: GED  . Highest education level: Not on file  Occupational History  . Occupation: Retired  Scientific laboratory technician  . Financial resource strain: Not hard at all  . Food insecurity    Worry: Never true    Inability: Never true  . Transportation needs    Medical: No    Non-medical: No  Tobacco Use  . Smoking status: Former Smoker    Packs/day: 2.00    Years: 50.00    Pack years: 100.00    Types: Cigarettes    Quit date: 02/05/2000    Years since quitting: 19.3  . Smokeless tobacco: Never Used  . Tobacco comment: Educated LDCT for lung Ca to discuss with MD if interested  Substance and Sexual Activity  . Alcohol use: Yes    Alcohol/week: 3.0 standard drinks    Types: 3 Glasses of wine per week    Comment: wine with dinner   . Drug use: No  . Sexual activity: Never    Partners: Male  Lifestyle  . Physical activity    Days per week: 4 days    Minutes per session: 40 min  . Stress: To some  extent  Relationships  . Social connections    Talks on phone: More than three times a week    Gets together: More than three times a week    Attends religious service: More than 4 times per year    Active member of club or organization: Yes    Attends meetings of clubs or organizations: More than 4 times per year    Relationship status: Married  . Intimate partner violence    Fear of current or ex partner: No    Emotionally abused: No    Physically abused: No    Forced sexual activity: No  Other Topics Concern  . Not on file  Social History Narrative   Lives w/ husband    Caffeine use: Coffee daily   Right handed       PHYSICAL EXAM  Vitals:   06/01/19 0806  BP: 116/68  Pulse: 80  Temp: (!) 97.5 F (36.4 C)  TempSrc: Oral  Weight: 124 lb (56.2 kg)  Height: _0  (1.626 m)   Body mass index is 21.28 kg/m.  Generalized: Well developed, in no acute distress  Cardiology: normal rate and rhythm, no murmur noted Neurological examination  Mentation: Alert oriented to  time, place, history taking. Follows all commands speech and language fluent Cranial nerve II-XII: Pupils were equal round reactive to light. Extraocular movements were full, visual field were full on confrontational test. Facial sensation and strength were normal. Uvula tongue midline. Head turning and shoulder shrug  were normal and symmetric. Motor: The motor testing reveals 5 over 5 strength of all 4 extremities. Good symmetric motor tone is noted throughout.  Sensory: Sensory testing is intact to soft touch on all 4 extremities. No evidence of extinction is noted.  Coordination: Cerebellar testing reveals good finger-nose-finger and heel-to-shin bilaterally.  Gait and station: Gait is normal. Tandem gait is normal. Romberg is negative. No drift is seen.  Reflexes: Deep tendon reflexes are symmetric and normal bilaterally.   DIAGNOSTIC DATA (LABS, IMAGING, TESTING) - I reviewed patient records, labs,  notes, testing and imaging myself where available.  MMSE - Mini Mental State Exam 05/27/2018 05/30/2015  Not completed: - Unable to complete  Orientation to time 5 -  Orientation to Place 5 -  Registration 3 -  Attention/ Calculation 3 -  Recall 3 -  Language- name 2 objects 2 -  Language- repeat 1 -  Language- follow 3 step command 3 -  Language- read & follow direction 1 -  Write a sentence 1 -  Copy design 1 -  Total score 28 -     Lab Results  Component Value Date   WBC 6.6 02/01/2019   HGB 14.7 02/01/2019   HCT 44.8 02/01/2019   MCV 84.6 02/01/2019   PLT 292.0 02/01/2019      Component Value Date/Time   NA 142 02/01/2019 0830   K 4.0 02/01/2019 0830   CL 106 02/01/2019 0830   CO2 25 02/01/2019 0830   GLUCOSE 88 02/01/2019 0830   BUN 19 02/01/2019 0830   CREATININE 0.91 02/01/2019 0830   CALCIUM 9.1 02/01/2019 0830   PROT 6.4 02/01/2019 0830   PROT 5.9 (L) 05/04/2017 0739   ALBUMIN 4.1 02/01/2019 0830   ALBUMIN 3.9 05/04/2017 0739   AST 16 02/01/2019 0830   ALT 11 02/01/2019 0830   ALKPHOS 65 02/01/2019 0830   BILITOT 0.4 02/01/2019 0830   BILITOT 0.2 05/04/2017 0739   GFRNONAA 84 (L) 10/10/2014 0834   GFRAA >90 10/10/2014 0834   Lab Results  Component Value Date   CHOL 260 (H) 11/19/2018   HDL 65 11/19/2018   LDLCALC 171 (H) 11/19/2018   TRIG 121 11/19/2018   CHOLHDL 4.0 11/19/2018   Lab Results  Component Value Date   HGBA1C 6.1 06/05/2016   Lab Results  Component Value Date   [id number] 098 02/01/2019   Lab Results  Component Value Date   TSH 1.85 02/01/2019       ASSESSMENT AND PLAN 73 y.o. year old female  has a past medical history of COPD (chronic obstructive pulmonary disease) ([place]), Coronary artery disease (08/2009), Endometriosis, Gestational diabetes, Granuloma annulare, Hyperlipidemia, Optic neuritis, Optic neuritis ([place]), and Vaginal anomaly. here with     ICD-10-CM   1. 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 Chronic right-sided  low back pain with right-sided sciatica  M54.41    G89.29   3. Gait disturbance  R26.9     Carlyon does report some benefit with her facial pain after starting duloxetine.  Her back and leg pain are nearly resolved.  We will restart duloxetine 30 mg daily.  I have discussed with her the need to take this medication every day and  avoid abrupt discontinuation.  Should she wish to stop the medication she was advised to call me and I will help her wean the medication.  I have advised that she only take meloxicam as needed.  This should not be taken daily.  We have also discussed the need to take this medication with food. She will continue to work with physical therapy through [facility]. We will follow-up annually, sooner if needed for worsening neurological symptoms.  She verbalizes understanding and agreement with this plan.   No orders of the defined types were placed in this encounter.    Meds ordered this encounter  Medications  . DULoxetine (CYMBALTA) 30 MG capsule    Sig: Take 1 capsule (30 mg total) by mouth daily.    Dispense:  90 capsule    Refill:  3    Order Specific Question:   Supervising Provider    Answer:   Melvenia Beam [id number]      I spent 15 minutes with the patient. 50% of this time was spent counseling and educating patient on plan of care and medications.    Debbora Presto, FNP-C 06/01/2019, 8:49 AM [facility] [address], [place], [place] [address] [phone]

## 2019-06-10 ENCOUNTER — Other Ambulatory Visit: Payer: Self-pay | Admitting: Internal Medicine

## 2019-06-10 ENCOUNTER — Other Ambulatory Visit: Payer: Self-pay

## 2019-06-10 DIAGNOSIS — Z1239 Encounter for other screening for malignant neoplasm of breast: Secondary | ICD-10-CM

## 2019-06-10 DIAGNOSIS — Z0000 Encounter for general adult medical examination without abnormal findings: Secondary | ICD-10-CM

## 2019-07-22 ENCOUNTER — Telehealth: Payer: Self-pay | Admitting: Internal Medicine

## 2019-07-22 NOTE — Telephone Encounter (Signed)
Patient declined to schedule AWV. SF 

## 2019-07-23 ENCOUNTER — Encounter: Payer: Self-pay | Admitting: Gastroenterology

## 2019-07-26 ENCOUNTER — Ambulatory Visit: Payer: Medicare HMO | Admitting: Internal Medicine

## 2019-07-26 ENCOUNTER — Encounter: Payer: Self-pay | Admitting: Internal Medicine

## 2019-07-26 ENCOUNTER — Other Ambulatory Visit: Payer: Self-pay

## 2019-07-26 VITALS — BP 112/76 | HR 67 | Ht 64.0 in | Wt 125.6 lb

## 2019-07-26 DIAGNOSIS — M791 Myalgia, unspecified site: Secondary | ICD-10-CM

## 2019-07-26 DIAGNOSIS — E785 Hyperlipidemia, unspecified: Secondary | ICD-10-CM

## 2019-07-26 DIAGNOSIS — Z789 Other specified health status: Secondary | ICD-10-CM

## 2019-07-26 DIAGNOSIS — I2510 Atherosclerotic heart disease of native coronary artery without angina pectoris: Secondary | ICD-10-CM

## 2019-07-26 LAB — LIPID PANEL
Chol/HDL Ratio: 2.9 ratio (ref 0.0–4.4)
Cholesterol, Total: 214 mg/dL — ABNORMAL HIGH (ref 100–199)
HDL: 74 mg/dL (ref >39)
LDL Calculated: 119 mg/dL — ABNORMAL HIGH (ref 0–99)
Triglycerides: 103 mg/dL (ref 0–149)
VLDL Cholesterol Cal: 21 mg/dL (ref 5–40)

## 2019-07-26 NOTE — Progress Notes (Signed)
OFFICE NOTE  Chief Complaint:  Persistent cough, runny nose  Primary Care Physician: Hoyt Koch, MD  HPI:  Wendy Patrick is a 73 y.o. female with a past medial history significant for mild coronary artery disease with a 40% circumflex stenosis in 2010, COPD, type 2 diabetes, hyperlipidemia and statin intolerance.  She was previously followed by Dr. Radford Pax however requested to switch to my care since her husband is a patient of mine.  We will try to see them simultaneously.  Recently she has been having some issues with diarrhea.  She is been undergoing treatment with antibiotics and care by Dr. Watt Climes.  She feels drained.  She is also congested and having difficulty with allergies and chronic cough.  She reports labile blood pressures from the upper 90s to 140s, but has no history of hypertension and is not on medication.  In fact she takes only aspirin.  She has been previously seen by one of our PharmD's in the lipid clinic to discuss her dyslipidemia and was offered ezetimibe, but did not take the medication due to cost issues.  Currently she reports some discomfort in the left shoulder arm and left back.  This is similar but much less significant to the discomfort she had in 2010.  She says she gets it occasionally.  Is not necessarily worse with exertion or relieved by rest and does not sound particularly anginal to me.  She did have a nuclear stress test last year which was completely normal.  11/10/2018  Wendy Patrick returns today for routine follow-up.  She does have a history of coronary artery disease with some circumflex stenosis of 40% in 2010, type 2 diabetes, hyperlipidemia and statin intolerance with a goal LDL less than 70.  Her main issues have been with some degree of neuropathy for which she is on gabapentin.  She says she is not tolerating this well and feels tired and is having some mood changes related to that.  Unfortunately she has been statin intolerant.  She has been  on both atorvastatin and rosuvastatin which caused leg cramps.  We had discussed PCSK9 inhibitor therapy in the past however had not pursued it.  Her most recent lab work from June 2018 showed total cholesterol 270, HDL 60, LDL 178 and triglycerides 158.  Although her diet sound somewhat atherogenic, there may be a component of FH.   07/26/2019  Wendy Patrick is seen today in follow-up.  She is also been using Repatha.  Overall she seems to be tolerating however she has been having some concerns with runny nose and persistent cough.  This is atypical for the medication and she was concerned though it may be a side effect.  She needs a recheck of her lipids.  She was on gabapentin for neuropathy but this is been discontinued and has been seeing neurologist.  There was concern that she may have some nerve damage from an injection in her teeth that may be related to her cough and some possible difficulty swallowing.  PMHx:  Past Medical History:  Diagnosis Date  . COPD (chronic obstructive pulmonary disease) ([place])   . Coronary artery disease 08/2009   40% mid left circ by cath in [place], [place]  . Endometriosis    1986  . Gestational diabetes   . Granuloma annulare   . Hyperlipidemia   . Optic neuritis    2015  . Optic neuritis Novembr  . Vaginal anomaly    tears and bleeds easily  Past Surgical History:  Procedure Laterality Date  . ABDOMINAL HYSTERECTOMY    . CARDIAC CATHETERIZATION    . COLONOSCOPY    . EYE SURGERY    . MANDIBLE SURGERY    . MINOR HEMORRHOIDECTOMY    . TUBAL LIGATION    . UPPER GASTROINTESTINAL ENDOSCOPY      FAMHx:  Family History  Problem Relation Age of Onset  . Cancer Mother        Breast   . Breast cancer Mother   . Stroke Brother        open heart surgery   . Colon cancer Neg Hx   . Esophageal cancer Neg Hx   . Rectal cancer Neg Hx   . Stomach cancer Neg Hx     SOCHx:   reports that she quit smoking about 19 years ago. Her smoking use included  cigarettes. She has a 100.00 pack-year smoking history. She has never used smokeless tobacco. She reports current alcohol use of about 3.0 standard drinks of alcohol per week. She reports that she does not use drugs.  ALLERGIES:  Allergies  Allergen Reactions  . Statins     Leg cramps  . Crestor [Rosuvastatin] Other (See Comments)    Leg cramps  . Latex Rash    ROS: Pertinent items noted in HPI and remainder of comprehensive ROS otherwise negative.  HOME MEDS: Current Outpatient Medications on File Prior to Visit  Medication Sig Dispense Refill  . aspirin EC 81 MG tablet Take 81 mg by mouth daily.    . cholecalciferol (VITAMIN D) 1000 units tablet Take 1,000 Units by mouth daily.    Marland Kitchen KRILL OIL PO Take 250 mg by mouth daily.     No current facility-administered medications on file prior to visit.     LABS/IMAGING: No results found for this or any previous visit (from the past 48 hour(s)). No results found.  LIPID PANEL:    Component Value Date/Time   CHOL 260 (H) 11/19/2018 0901   TRIG 121 11/19/2018 0901   HDL 65 11/19/2018 0901   CHOLHDL 4.0 11/19/2018 0901   CHOLHDL 4 06/05/2016 0843   VLDL 35.8 06/05/2016 0843   LDLCALC 171 (H) 11/19/2018 0901     WEIGHTS: Wt Readings from Last 3 Encounters:  07/26/19 125 lb 9.6 oz (57 kg)  06/01/19 124 lb (56.2 kg)  02/01/19 123 lb (55.8 kg)    VITALS: BP 112/76   Pulse 67   Ht 5\' 4"  (1.626 m)   Wt 125 lb 9.6 oz (57 kg)   SpO2 99%   BMI 21.56 kg/m   EXAM: General appearance: alert and no distress Neck: no carotid bruit, no JVD and thyroid not enlarged, symmetric, no tenderness/mass/nodules Lungs: clear to auscultation bilaterally Heart: regular rate and rhythm Abdomen: soft, non-tender; bowel sounds normal; no masses,  no organomegaly Extremities: extremities normal, atraumatic, no cyanosis or edema Pulses: 2+ and symmetric Skin: Skin color, texture, turgor normal. No rashes or lesions Neurologic: Grossly normal  Psych: Pleasant  EKG: Normal sinus rhythm 67-personally reviewed  ASSESSMENT: 1. Mild nonobstructive coronary disease by cath (2010) 2. Atypical chest pain 3. Dyslipidemia-statin intolerant  PLAN: 1.   Wendy Patrick has nonobstructive coronary disease by cath in 2010 but has been statin intolerant.  She has been on Repatha but recently was concerned that may be causing some of her side effects.  I think it is unlikely that her rhinitis and cough are related to Repatha although she thought it was  somewhat better when she had skipped a dose.  I like to see what her numbers are and it may be encouraging for her to continue on it.  She should consider reflux medications or nasal steroids possible to see if there is a postnasal drip/sinusitis or reflux causing some of her cough.  There are no medication offenders since she is essentially on no medicines.  Follow-up in with me in 6 months.  Pixie Casino, MD, [facility] [place] Director of the Advanced Lipid Disorders &  Cardiovascular Risk Reduction Clinic Diplomate of the American Board of Clinical Lipidology Attending Cardiologist  Direct Dial: [phone]  Fax: [phone]  Website:  [email or web address] 07/26/2019, 9:08 AM

## 2019-07-26 NOTE — Patient Instructions (Signed)
Medication Instructions:  Your physician recommends that you continue on your current medications as directed. Please refer to the Current Medication list given to you today.  If you need a refill on your cardiac medications before your next appointment, please call your pharmacy.   Lab work: FASTING LP TODAY   If you have labs (blood work) drawn today and your tests are completely normal, you will receive your results only by: Marland Kitchen MyChart Message (if you have MyChart) OR . A paper copy in the mail If you have any lab test that is abnormal or we need to change your treatment, we will call you to review the results.  Testing/Procedures: NONE  Follow-Up: At [facility], you and your health needs are our priority.  As part of our continuing mission to provide you with exceptional heart care, we have created designated Provider Care Teams.  These Care Teams include your primary Cardiologist (physician) and Advanced Practice Providers (APPs -  Physician Assistants and Nurse Practitioners) who all work together to provide you with the care you need, when you need it. You will need a follow up appointment in 6 months.  Please call our office 2 months in advance to schedule this appointment.  You may see Pixie Casino, MD or one of the following Advanced Practice Providers on your designated Care Team: [place], [place] . Fabian Sharp, PA-C  Any Other Special Instructions Will Be Listed Below (If Applicable). TRY SOME PEPCID OVER THE COUNTER TO SEE IF IT HELPS WITH YOUR COUGH

## 2019-08-02 ENCOUNTER — Other Ambulatory Visit: Payer: Self-pay | Admitting: Internal Medicine

## 2019-08-02 DIAGNOSIS — Z79899 Other long term (current) drug therapy: Secondary | ICD-10-CM

## 2019-08-02 DIAGNOSIS — R69 Illness, unspecified: Secondary | ICD-10-CM | POA: Diagnosis not present

## 2019-08-03 DIAGNOSIS — Z79899 Other long term (current) drug therapy: Secondary | ICD-10-CM | POA: Diagnosis not present

## 2019-08-03 LAB — COMPREHENSIVE METABOLIC PANEL
ALT: 11 IU/L (ref 0–32)
AST: 18 IU/L (ref 0–40)
Albumin/Globulin Ratio: 2 (ref 1.2–2.2)
Albumin: 4.1 g/dL (ref 3.7–4.7)
Alkaline Phosphatase: 86 IU/L (ref 39–117)
BUN/Creatinine Ratio: 18 (ref 12–28)
BUN: 16 mg/dL (ref 8–27)
Bilirubin Total: 0.3 mg/dL (ref 0.0–1.2)
CO2: 27 mmol/L (ref 20–29)
Calcium: 9.6 mg/dL (ref 8.7–10.3)
Chloride: 102 mmol/L (ref 96–106)
Creatinine, Ser: 0.89 mg/dL (ref 0.57–1.00)
GFR calc Af Amer: 74 mL/min/{1.73_m2} (ref >59)
GFR calc non Af Amer: 64 mL/min/{1.73_m2} (ref >59)
Globulin, Total: 2.1 g/dL (ref 1.5–4.5)
Glucose: 84 mg/dL (ref 65–99)
Potassium: 5.1 mmol/L (ref 3.5–5.2)
Sodium: 141 mmol/L (ref 134–144)
Total Protein: 6.2 g/dL (ref 6.0–8.5)

## 2019-08-22 DIAGNOSIS — H40013 Open angle with borderline findings, low risk, bilateral: Secondary | ICD-10-CM | POA: Diagnosis not present

## 2019-08-22 DIAGNOSIS — H0102B Squamous blepharitis left eye, upper and lower eyelids: Secondary | ICD-10-CM | POA: Diagnosis not present

## 2019-08-22 DIAGNOSIS — H10413 Chronic giant papillary conjunctivitis, bilateral: Secondary | ICD-10-CM | POA: Diagnosis not present

## 2019-08-22 DIAGNOSIS — H469 Unspecified optic neuritis: Secondary | ICD-10-CM | POA: Diagnosis not present

## 2019-08-22 DIAGNOSIS — H26491 Other secondary cataract, right eye: Secondary | ICD-10-CM | POA: Diagnosis not present

## 2019-08-22 DIAGNOSIS — Z961 Presence of intraocular lens: Secondary | ICD-10-CM | POA: Diagnosis not present

## 2019-08-22 DIAGNOSIS — H0102A Squamous blepharitis right eye, upper and lower eyelids: Secondary | ICD-10-CM | POA: Diagnosis not present

## 2019-08-25 ENCOUNTER — Ambulatory Visit
Admission: RE | Admit: 2019-08-25 | Discharge: 2019-08-25 | Disposition: A | Discharge status: Home / Self Care | Payer: Medicare HMO | Source: Ambulatory Visit | Attending: Internal Medicine | Admitting: Internal Medicine

## 2019-08-25 ENCOUNTER — Other Ambulatory Visit: Payer: Self-pay

## 2019-08-25 DIAGNOSIS — M8588 Other specified disorders of bone density and structure, other site: Secondary | ICD-10-CM | POA: Diagnosis not present

## 2019-08-25 DIAGNOSIS — Z1231 Encounter for screening mammogram for malignant neoplasm of breast: Secondary | ICD-10-CM | POA: Diagnosis not present

## 2019-08-25 DIAGNOSIS — M810 Age-related osteoporosis without current pathological fracture: Secondary | ICD-10-CM | POA: Diagnosis not present

## 2019-08-25 DIAGNOSIS — Z0000 Encounter for general adult medical examination without abnormal findings: Secondary | ICD-10-CM

## 2019-08-25 DIAGNOSIS — Z780 Asymptomatic menopausal state: Secondary | ICD-10-CM | POA: Diagnosis not present

## 2019-08-25 DIAGNOSIS — E2839 Other primary ovarian failure: Secondary | ICD-10-CM

## 2019-09-05 ENCOUNTER — Other Ambulatory Visit: Payer: Self-pay | Admitting: Internal Medicine

## 2019-09-05 MED ORDER — ALENDRONATE SODIUM 70 MG PO TABS: 70.0000 mg | ORAL_TABLET | ORAL | 3 refills | Status: DC

## 2019-09-30 NOTE — Progress Notes (Signed)
Subjective:   Wendy Patrick is a 73 y.o. female who presents for Medicare Annual (Subsequent) preventive examination.  Review of Systems:   Cardiac Risk Factors include: advanced age (>29men, >59 women);dyslipidemia Sleep patterns: gets up 2 times nightly to void and sleeps 5-6 hours nightly.    Home Safety/Smoke Alarms: Feels safe in home. Smoke alarms in place.  Living environment; residence and Firearm Safety: apartment. Resides at [facility] living center. Lives with husband, no needs for DME, good support system .Seat Belt Safety/Bike Helmet: Wears seat belt.    Objective:     Vitals: BP 114/82   Pulse 75   Resp 17   Ht 5\' 4"  (1.626 m)   Wt 132 lb (59.9 kg)   SpO2 99%   BMI 22.66 kg/m   Body mass index is 22.66 kg/m.  Advanced Directives 10/03/2019 05/27/2018 08/13/2016 07/29/2016 05/30/2015 11/06/2014 10/09/2014  Does Patient Have a Medical Advance Directive? Yes No Yes No No No No  Type of Paramedic of [place];Living will - [place] in Chart? No - copy requested - - - - - -  Would patient like information on creating a medical advance directive? - Yes (ED - Information included in AVS) - No - patient declined information - No - patient declined information No - patient declined information    Tobacco Social History   Tobacco Use  Smoking Status Former Smoker  . Packs/day: 2.00  . Years: 50.00  . Pack years: 100.00  . Types: Cigarettes  . Quit date: 02/05/2000  . Years since quitting: 19.6  Smokeless Tobacco Never Used  Tobacco Comment   Educated LDCT for lung Ca to discuss with MD if interested     Counseling given: Not Answered Comment: Educated LDCT for lung Ca to discuss with MD if interested  Past Medical History:  Diagnosis Date  . COPD (chronic obstructive pulmonary disease) ([place])   . Coronary artery disease 08/2009   40% mid left circ by cath in [place], [place]   . Endometriosis    1986  . Gestational diabetes   . Granuloma annulare   . Hyperlipidemia   . Optic neuritis    2015  . Optic neuritis Novembr  . Vaginal anomaly    tears and bleeds easily   Past Surgical History:  Procedure Laterality Date  . ABDOMINAL HYSTERECTOMY    . CARDIAC CATHETERIZATION    . COLONOSCOPY    . EYE SURGERY    . MANDIBLE SURGERY    . MINOR HEMORRHOIDECTOMY    . TUBAL LIGATION    . UPPER GASTROINTESTINAL ENDOSCOPY     Family History  Problem Relation Age of Onset  . Cancer Mother        Breast   . Breast cancer Mother   . Stroke Brother        open heart surgery   . Colon cancer Neg Hx   . Esophageal cancer Neg Hx   . Rectal cancer Neg Hx   . Stomach cancer Neg Hx    Social History   Socioeconomic History  . Marital status: Married    Spouse name: Iona Beard  . Number of children: 1  . Years of education: GED  . Highest education level: Not on file  Occupational History  . Occupation: Retired  Scientific laboratory technician  . Financial resource strain: Not hard at all  . Food insecurity  Worry: Never true    Inability: Never true  . Transportation needs    Medical: No    Non-medical: No  Tobacco Use  . Smoking status: Former Smoker    Packs/day: 2.00    Years: 50.00    Pack years: 100.00    Types: Cigarettes    Quit date: 02/05/2000    Years since quitting: 19.6  . Smokeless tobacco: Never Used  . Tobacco comment: Educated LDCT for lung Ca to discuss with MD if interested  Substance and Sexual Activity  . Alcohol use: Yes    Alcohol/week: 3.0 standard drinks    Types: 3 Glasses of  per week    Comment:  with dinner   . Drug use: No  . Sexual activity: Never    Partners: Male  Lifestyle  . Physical activity    Days per week: 4 days    Minutes per session: 30 min  . Stress: Only a little  Relationships  . Social connections    Talks on phone: More than three times a week    Gets together: More than three times a week    Attends  religious service: More than 4 times per year    Active member of club or organization: Yes    Attends meetings of clubs or organizations: More than 4 times per year    Relationship status: Married  Other Topics Concern  . Not on file  Social History Narrative   Lives w/ husband    Caffeine use: Coffee daily   Right handed     Outpatient Encounter Medications as of 10/03/2019  Medication Sig  . alendronate (FOSAMAX) 70 MG tablet Take 1 tablet (70 mg total) by mouth every 7 (seven) days. Take with a full glass of water on an empty stomach.  Marland Kitchen aspirin EC 81 MG tablet Take 81 mg by mouth daily.  . cholecalciferol (VITAMIN D) 1000 units tablet Take 1,000 Units by mouth daily.  Marland Kitchen ibuprofen (ADVIL) 200 MG tablet Take 200 mg by mouth every 6 (six) hours as needed.  Marland Kitchen KRILL OIL PO Take 250 mg by mouth daily.   No facility-administered encounter medications on file as of 10/03/2019.     Activities of Daily Living In your present state of health, do you have any difficulty performing the following activities: 10/03/2019  Hearing? N  Vision? N  Difficulty concentrating or making decisions? N  Walking or climbing stairs? N  Dressing or bathing? N  Doing errands, shopping? N  Preparing Food and eating ? N  Using the Toilet? N  In the past six months, have you accidently leaked urine? N  Do you have problems with loss of bowel control? N  Managing your Medications? N  Managing your Finances? N  Housekeeping or managing your Housekeeping? N  Some recent data might be hidden    Patient Care Team: Hoyt Koch, MD as PCP - General (Internal Medicine) Debara Pickett Nadean Corwin, MD as PCP - Cardiology (Cardiology) Tanda Rockers, MD as Consulting Physician (Pulmonary Disease) Warden Fillers, MD as Consulting Physician (Ophthalmology) Pieter Partridge, DO as Consulting Physician (Neurology) Jerrell Belfast, MD as Consulting Physician (Otolaryngology) Debara Pickett Nadean Corwin, MD as Consulting  Physician (Cardiology) Clarene Essex, MD as Consulting Physician (Gastroenterology)    Assessment:   This is a routine wellness examination for Wendy Patrick. Physical assessment deferred to PCP.   Exercise Activities and Dietary recommendations Current Exercise Habits: Home exercise routine, Type of exercise: walking;calisthenics, Time (Minutes): 40,  Frequency (Times/Week): 4, Weekly Exercise (Minutes/Week): 160, Intensity: Mild, Exercise limited by: orthopedic condition(s)  Diet (meal preparation, eat out, water intake, caffeinated beverages, dairy products, fruits and vegetables): in general, a "healthy" diet  , well balanced. eats a variety of fruits and vegetables daily, limits salt, fat/cholesterol, sugar,carbohydrates,caffeine, drinks 6-8 glasses of water daily.  Goals    . Patient Stated     Continue to exercise, enjoy life and family. Stay socially active.     . Reduce fat intake to X grams per day     Will continue on the [place] diet; continue with home exercise         Fall Risk Fall Risk  10/03/2019 10/06/2018 05/27/2018 08/17/2017 06/05/2016  Falls in the past year? 0 1 No No No  Comment - - - - -  Number falls in past yr: 0 1 - - -  Injury with Fall? 0 1 - - -  Comment - - - - -  Risk for fall due to : - - - - -  Risk for fall due to: Comment - - - - -   Is the patient's home free of loose throw rugs in walkways, pet beds, electrical cords, etc?   yes      Grab bars in the bathroom? yes      Handrails on the stairs?   yes      Adequate lighting?   yes  Depression Screen PHQ 2/9 Scores 10/03/2019 05/27/2018 08/17/2017 06/05/2016  PHQ - 2 Score 0 1 1 0     Cognitive Function MMSE - Mini Mental State Exam 05/27/2018 05/30/2015  Not completed: - Unable to complete  Orientation to time 5 -  Orientation to Place 5 -  Registration 3 -  Attention/ Calculation 3 -  Recall 3 -  Language- name 2 objects 2 -  Language- repeat 1 -  Language- follow 3 step command 3 -  Language-  read & follow direction 1 -  Write a sentence 1 -  Copy design 1 -  Total score 28 -     6CIT Screen 10/03/2019  What Year? 0 points  What month? 0 points  What time? 0 points  Count back from 20 0 points  Months in reverse 0 points  Repeat phrase 4 points  Total Score 4    Immunization History  Administered Date(s) Administered  . Pneumococcal Conjugate-13 06/05/2016  . Pneumococcal Polysaccharide-23 12/01/2009, 08/02/2019  . Tdap 12/01/2010  . Zoster 12/02/2011   Screening Tests Health Maintenance  Topic Date Due  . COLONOSCOPY  08/14/2019  . INFLUENZA VACCINE  03/01/2020 (Originally 07/02/2019)  . TETANUS/TDAP  12/01/2020  . DEXA SCAN  Completed  . Hepatitis C Screening  Completed  . PNA vac Low Risk Adult  Completed      Plan:   Reviewed health maintenance screenings with patient today and relevant education, vaccines, and/or referrals were provided.   I have personally reviewed and noted the following in the patient's chart:   . Medical and social history . Use of alcohol, tobacco or illicit drugs  . Current medications and supplements . Functional ability and status . Nutritional status . Physical activity . Advanced directives . List of other physicians . Vitals . Screenings to include cognitive, depression, and falls . Referrals and appointments  In addition, I have reviewed and discussed with patient certain preventive protocols, quality metrics, and best practice recommendations. A written personalized care plan for preventive services as well as general  preventive health recommendations were provided to patient.     Michiel Cowboy, RN  10/03/2019

## 2019-10-03 ENCOUNTER — Other Ambulatory Visit: Payer: Self-pay

## 2019-10-03 ENCOUNTER — Ambulatory Visit (INDEPENDENT_AMBULATORY_CARE_PROVIDER_SITE_OTHER): Payer: Medicare HMO | Admitting: *Deleted

## 2019-10-03 VITALS — BP 114/82 | HR 75 | Resp 17 | Ht 64.0 in | Wt 132.0 lb

## 2019-10-03 DIAGNOSIS — Z0000 Encounter for general adult medical examination without abnormal findings: Secondary | ICD-10-CM | POA: Diagnosis not present

## 2019-10-03 NOTE — Progress Notes (Signed)
Medical screening examination/treatment/procedure(s) were performed by non-physician practitioner and as supervising physician I was immediately available for consultation/collaboration. I agree with above. Elizabeth A Crawford, MD 

## 2019-10-03 NOTE — Patient Instructions (Signed)
Continue doing brain stimulating activities (puzzles, reading, adult coloring books, staying active) to keep memory sharp.   Continue to eat heart healthy diet (full of fruits, vegetables, whole grains, lean protein, water--limit salt, fat, and sugar intake) and increase physical activity as tolerated.   [name] , Thank you for taking time to come for your Medicare Wellness Visit. I appreciate your ongoing commitment to your health goals. Please review the following plan we discussed and let me know if I can assist you in the future.   These are the goals we discussed: Goals    . Patient Stated     Continue to exercise, enjoy life and family. Stay socially active.     . Reduce fat intake to X grams per day     Will continue on the [place] diet; continue with home exercise         This is a list of the screening recommended for you and due dates:  Health Maintenance  Topic Date Due  . Flu Shot  07/02/2019  . Colon Cancer Screening  08/14/2019  . Tetanus Vaccine  12/01/2020  . DEXA scan (bone density measurement)  Completed  .  Hepatitis C: One time screening is recommended by Center for Disease Control  (CDC) for  adults born from [birth year] through 1967.   Completed  . Pneumonia vaccines  Completed    Preventive Care 59 Years and Older, Female Preventive care refers to lifestyle choices and visits with your health care provider that can promote health and wellness. This includes:  A yearly physical exam. This is also called an annual well check.  Regular dental and eye exams.  Immunizations.  Screening for certain conditions.  Healthy lifestyle choices, such as diet and exercise. What can I expect for my preventive care visit? Physical exam Your health care provider will check:  Height and weight. These may be used to calculate body mass index (BMI), which is a measurement that tells if you are at a healthy weight.  Heart rate and blood pressure.  Your skin for abnormal  spots. Counseling Your health care provider may ask you questions about:  Alcohol, tobacco, and drug use.  Emotional well-being.  Home and relationship well-being.  Sexual activity.  Eating habits.  History of falls.  Memory and ability to understand (cognition).  Work and work Statistician.  Pregnancy and menstrual history. What immunizations do I need?  Influenza (flu) vaccine  This is recommended every year. Tetanus, diphtheria, and pertussis (Tdap) vaccine  You may need a Td booster every 10 years. Varicella (chickenpox) vaccine  You may need this vaccine if you have not already been vaccinated. Zoster (shingles) vaccine  You may need this after age 19. Pneumococcal conjugate (PCV13) vaccine  One dose is recommended after age 69. Pneumococcal polysaccharide (PPSV23) vaccine  One dose is recommended after age 81. Measles, mumps, and rubella (MMR) vaccine  You may need at least one dose of MMR if you were born in 1957 or later. You may also need a second dose. Meningococcal conjugate (MenACWY) vaccine  You may need this if you have certain conditions. Hepatitis A vaccine  You may need this if you have certain conditions or if you travel or work in places where you may be exposed to hepatitis A. Hepatitis B vaccine  You may need this if you have certain conditions or if you travel or work in places where you may be exposed to hepatitis B. Haemophilus influenzae type b (Hib) vaccine  You may need this if you have certain conditions. You may receive vaccines as individual doses or as more than one vaccine together in one shot (combination vaccines). Talk with your health care provider about the risks and benefits of combination vaccines. What tests do I need? Blood tests  Lipid and cholesterol levels. These may be checked every 5 years, or more frequently depending on your overall health.  Hepatitis C test.  Hepatitis B test. Screening  Lung cancer  screening. You may have this screening every year starting at age 37 if you have a 30-pack-year history of smoking and currently smoke or have quit within the past 15 years.  Colorectal cancer screening. All adults should have this screening starting at age 70 and continuing until age 72. Your health care provider may recommend screening at age 28 if you are at increased risk. You will have tests every 1-10 years, depending on your results and the type of screening test.  Diabetes screening. This is done by checking your blood sugar (glucose) after you have not eaten for a while (fasting). You may have this done every 1-3 years.  Mammogram. This may be done every 1-2 years. Talk with your health care provider about how often you should have regular mammograms.  BRCA-related cancer screening. This may be done if you have a family history of breast, ovarian, tubal, or peritoneal cancers. Other tests  Sexually transmitted disease (STD) testing.  Bone density scan. This is done to screen for osteoporosis. You may have this done starting at age 43. Follow these instructions at home: Eating and drinking  Eat a diet that includes fresh fruits and vegetables, whole grains, lean protein, and low-fat dairy products. Limit your intake of foods with high amounts of sugar, saturated fats, and salt.  Take vitamin and mineral supplements as recommended by your health care provider.  Do not drink alcohol if your health care provider tells you not to drink.  If you drink alcohol: ? Limit how much you have to 0-1 drink a day. ? Be aware of how much alcohol is in your drink. In the [place] one drink equals one 12 oz bottle of beer (355 mL), one 5 oz glass of wine (148 mL), or one 1 oz glass of hard liquor (44 mL). Lifestyle  Take daily care of your teeth and gums.  Stay active. Exercise for at least 30 minutes on 5 or more days each week.  Do not use any products that contain nicotine or tobacco, such as  cigarettes, e-cigarettes, and chewing tobacco. If you need help quitting, ask your health care provider.  If you are sexually active, practice safe sex. Use a condom or other form of protection in order to prevent STIs (sexually transmitted infections).  Talk with your health care provider about taking a low-dose aspirin or statin. What's next?  Go to your health care provider once a year for a well check visit.  Ask your health care provider how often you should have your eyes and teeth checked.  Stay up to date on all vaccines. This information is not intended to replace advice given to you by your health care provider. Make sure you discuss any questions you have with your health care provider. Document Released: 12/14/2015 Document Revised: 11/11/2018 Document Reviewed: 11/11/2018 Elsevier Patient Education  2020 [employer].

## 2019-11-03 ENCOUNTER — Telehealth: Payer: Self-pay | Admitting: Internal Medicine

## 2019-11-03 NOTE — Telephone Encounter (Signed)
Upon attempting repatha prior auth renewal via [email or web address] -- "The patient currently has access to the requested medication and a Prior Authorization is not needed for the patient/medication."

## 2019-12-02 HISTORY — PX: COLONOSCOPY: SHX174

## 2019-12-02 HISTORY — PX: UPPER GASTROINTESTINAL ENDOSCOPY: SHX188

## 2020-01-31 ENCOUNTER — Ambulatory Visit: Payer: Medicare HMO | Admitting: Internal Medicine

## 2020-02-22 ENCOUNTER — Encounter: Payer: Self-pay | Admitting: Internal Medicine

## 2020-02-22 ENCOUNTER — Other Ambulatory Visit: Payer: Self-pay

## 2020-02-22 ENCOUNTER — Ambulatory Visit: Payer: Medicare HMO | Admitting: Internal Medicine

## 2020-02-22 VITALS — BP 130/60 | HR 75 | Temp 97.3°F | Ht 64.0 in | Wt 135.8 lb

## 2020-02-22 DIAGNOSIS — T466X5A Adverse effect of antihyperlipidemic and antiarteriosclerotic drugs, initial encounter: Secondary | ICD-10-CM

## 2020-02-22 DIAGNOSIS — M791 Myalgia, unspecified site: Secondary | ICD-10-CM | POA: Diagnosis not present

## 2020-02-22 DIAGNOSIS — Z789 Other specified health status: Secondary | ICD-10-CM | POA: Diagnosis not present

## 2020-02-22 DIAGNOSIS — I2510 Atherosclerotic heart disease of native coronary artery without angina pectoris: Secondary | ICD-10-CM

## 2020-02-22 DIAGNOSIS — Z01812 Encounter for preprocedural laboratory examination: Secondary | ICD-10-CM

## 2020-02-22 DIAGNOSIS — R072 Precordial pain: Secondary | ICD-10-CM

## 2020-02-22 MED ORDER — METOPROLOL TARTRATE 100 MG PO TABS: 100.0000 mg | ORAL_TABLET | Freq: Once | ORAL | 0 refills | Status: DC

## 2020-02-22 NOTE — Progress Notes (Signed)
OFFICE NOTE  Chief Complaint:  Infrequent chest pain  Primary Care Physician: Hoyt Koch, MD  HPI:  Wendy Patrick is a 74 y.o. female with a past medial history significant for mild coronary artery disease with a 40% circumflex stenosis in 2010, COPD, type 2 diabetes, hyperlipidemia and statin intolerance.  She was previously followed by Dr. Radford Pax however requested to switch to my care since her husband is a patient of mine.  We will try to see them simultaneously.  Recently she has been having some issues with diarrhea.  She is been undergoing treatment with antibiotics and care by Dr. Watt Climes.  She feels drained.  She is also congested and having difficulty with allergies and chronic cough.  She reports labile blood pressures from the upper 90s to 140s, but has no history of hypertension and is not on medication.  In fact she takes only aspirin.  She has been previously seen by one of our PharmD's in the lipid clinic to discuss her dyslipidemia and was offered ezetimibe, but did not take the medication due to cost issues.  Currently she reports some discomfort in the left shoulder arm and left back.  This is similar but much less significant to the discomfort she had in 2010.  She says she gets it occasionally.  Is not necessarily worse with exertion or relieved by rest and does not sound particularly anginal to me.  She did have a nuclear stress test last year which was completely normal.  11/10/2018  Wendy Patrick returns today for routine follow-up.  She does have a history of coronary artery disease with some circumflex stenosis of 40% in 2010, type 2 diabetes, hyperlipidemia and statin intolerance with a goal LDL less than 70.  Her main issues have been with some degree of neuropathy for which she is on gabapentin.  She says she is not tolerating this well and feels tired and is having some mood changes related to that.  Unfortunately she has been statin intolerant.  She has been on both  atorvastatin and rosuvastatin which caused leg cramps.  We had discussed PCSK9 inhibitor therapy in the past however had not pursued it.  Her most recent lab work from June 2018 showed total cholesterol 270, HDL 60, LDL 178 and triglycerides 158.  Although her diet sound somewhat atherogenic, there may be a component of FH.   07/26/2019  Wendy Patrick is seen today in follow-up.  She is also been using Repatha.  Overall she seems to be tolerating however she has been having some concerns with runny nose and persistent cough.  This is atypical for the medication and she was concerned though it may be a side effect.  She needs a recheck of her lipids.  She was on gabapentin for neuropathy but this is been discontinued and has been seeing neurologist.  There was concern that she may have some nerve damage from an injection in her teeth that may be related to her cough and some possible difficulty swallowing.  02/22/2020  Wendy Patrick is seen today in follow-up with her husband. Unfortunately she stopped using Repatha. She had had a good response in her LDL cholesterol which went from 171 down to 119. I suspect that she could have FH as there is a strong family history of heart disease in her parents. She has had no cardiac symptoms that I was aware of however recently did have some left-sided chest discomfort. It apparently was pretty persistent and lasted for  couple hours. It was not associated with exertion or relieved by rest however did radiate into her left arm. According to her husband she had had a cardiovascular work-up by Dr. Fletcher Anon in the past which showed mild nonobstructive coronary disease with up to about a 40% stenosis. This was in 2010.  PMHx:  Past Medical History:  Diagnosis Date  . COPD (chronic obstructive pulmonary disease) ([place])   . Coronary artery disease 08/2009   40% mid left circ by cath in [place], [place]  . Endometriosis    1986  . Gestational diabetes   . Granuloma annulare   .  Hyperlipidemia   . Optic neuritis    2015  . Optic neuritis Novembr  . Vaginal anomaly    tears and bleeds easily    Past Surgical History:  Procedure Laterality Date  . ABDOMINAL HYSTERECTOMY    . CARDIAC CATHETERIZATION    . COLONOSCOPY    . EYE SURGERY    . MANDIBLE SURGERY    . MINOR HEMORRHOIDECTOMY    . TUBAL LIGATION    . UPPER GASTROINTESTINAL ENDOSCOPY      FAMHx:  Family History  Problem Relation Age of Onset  . Cancer Mother        Breast   . Breast cancer Mother   . Stroke Brother        open heart surgery   . Colon cancer Neg Hx   . Esophageal cancer Neg Hx   . Rectal cancer Neg Hx   . Stomach cancer Neg Hx     SOCHx:   reports that she quit smoking about 20 years ago. Her smoking use included cigarettes. She has a 100.00 pack-year smoking history. She has never used smokeless tobacco. She reports current alcohol use of about 3.0 standard drinks of alcohol per week. She reports that she does not use drugs.  ALLERGIES:  Allergies  Allergen Reactions  . Statins     Leg cramps  . Crestor [Rosuvastatin] Other (See Comments)    Leg cramps  . Latex Rash    ROS: Pertinent items noted in HPI and remainder of comprehensive ROS otherwise negative.  HOME MEDS: Current Outpatient Medications on File Prior to Visit  Medication Sig Dispense Refill  . aspirin EC 81 MG tablet Take 81 mg by mouth daily.    . cholecalciferol (VITAMIN D) 1000 units tablet Take 1,000 Units by mouth daily.    Marland Kitchen KRILL OIL PO Take 250 mg by mouth daily.     No current facility-administered medications on file prior to visit.    LABS/IMAGING: No results found for this or any previous visit (from the past 48 hour(s)). No results found.  LIPID PANEL:    Component Value Date/Time   CHOL 214 (H) 07/26/2019 0949   TRIG 103 07/26/2019 0949   HDL 74 07/26/2019 0949   CHOLHDL 2.9 07/26/2019 0949   CHOLHDL 4 06/05/2016 0843   VLDL 35.8 06/05/2016 0843   LDLCALC 119 (H) 07/26/2019  0949     WEIGHTS: Wt Readings from Last 3 Encounters:  02/22/20 135 lb 12.8 oz (61.6 kg)  10/03/19 132 lb (59.9 kg)  07/26/19 125 lb 9.6 oz (57 kg)    VITALS: BP 130/60   Pulse 75   Temp (!) 97.3 F (36.3 C)   Ht 5\' 4"  (1.626 m)   Wt 135 lb 12.8 oz (61.6 kg)   SpO2 95%   BMI 23.31 kg/m   EXAM: General appearance: alert and no distress  Neck: no carotid bruit, no JVD and thyroid not enlarged, symmetric, no tenderness/mass/nodules Lungs: clear to auscultation bilaterally Heart: regular rate and rhythm Abdomen: soft, non-tender; bowel sounds normal; no masses,  no organomegaly Extremities: extremities normal, atraumatic, no cyanosis or edema Pulses: 2+ and symmetric Skin: Skin color, texture, turgor normal. No rashes or lesions Neurologic: Grossly normal Psych: Pleasant  EKG: Normal sinus rhythm 75 -personally reviewed  ASSESSMENT: 1. Chest pain 2. Mild nonobstructive coronary disease by cath (2010) 3. Dyslipidemia-statin intolerant  PLAN: 1.   Wendy Patrick has had some recurrent chest pain and her husband is concerned because of her history of mild nonobstructive coronary disease by cath in 2010 that it may have progressed. There is a strong family history of heart disease and possible familial hyperlipidemia. Her LDL was at least 170 off of therapy and she did have a good improvement on Repatha but discontinued it due to concerns about muscle aches and possible side effects. I do not actually think this was related to the medication. We will have to discuss other options for management, however. With regards to the chest pain, we will plan to reassess that with a coronary CT angiogram with FFR. Heart rates in the 70s with a normal rhythm I think is likely to be able to be beta blocked much lower for a good study.  Follow-up in with me in 1 month.  Pixie Casino, MD, [facility], [place] Director of the Advanced Lipid Disorders &    Cardiovascular Risk Reduction Clinic Diplomate of the American Board of Clinical Lipidology Attending Cardiologist  Direct Dial: [phone]  Fax: [phone]  Website:  [email or web address] [email or web address] 02/22/2020, 10:50 AM

## 2020-02-22 NOTE — Patient Instructions (Addendum)
Medication Instructions:  Your physician recommends that you continue on your current medications as directed. Please refer to the Current Medication list given to you today.  *If you need a refill on your cardiac medications before your next appointment, please call your pharmacy*   Lab Work: BMET - prior to CT test  If you have labs (blood work) drawn today and your tests are completely normal, you will receive your results only by: Marland Kitchen MyChart Message (if you have MyChart) OR . A paper copy in the mail If you have any lab test that is abnormal or we need to change your treatment, we will call you to review the results.   Testing/Procedures: Coronary CT with FFR [facility] This will be pre-certified with insurance first and then you will be contacted to schedule   Follow-Up: At [facility], you and your health needs are our priority.  As part of our continuing mission to provide you with exceptional heart care, we have created designated Provider Care Teams.  These Care Teams include your primary Cardiologist (physician) and Advanced Practice Providers (APPs -  Physician Assistants and Nurse Practitioners) who all work together to provide you with the care you need, when you need it.  We recommend signing up for the patient portal called "MyChart".  Sign up information is provided on this After Visit Summary.  MyChart is used to connect with patients for Virtual Visits (Telemedicine).  Patients are able to view lab/test results, encounter notes, upcoming appointments, etc.  Non-urgent messages can be sent to your provider as well.   To learn more about what you can do with MyChart, go to [email or web address].    Your next appointment:   4-6 week(s)  The format for your next appointment:   In Person  Provider:   You may see Pixie Casino, MD or one of the following Advanced Practice Providers on your designated Care Team:    Almyra Deforest, PA-C  Fabian Sharp, PA-C or    Roby Lofts, [place]    Other Instructions    Your cardiac CT will be scheduled at one of the below locations:   [facility] [address] [place], [place] [address] [phone]  If scheduled at [facility], please arrive at the [facility] main entrance of [facility] 30 minutes prior to test start time. Proceed to the [facility] Radiology Department (first floor) to check-in and test prep.  If scheduled at [facility], please arrive 15 mins early for check-in and test prep.  Please follow these instructions carefully (unless otherwise directed):   On the Night Before the Test: . Be sure to Drink plenty of water. . Do not consume any caffeinated/decaffeinated beverages or chocolate 12 hours prior to your test. . Do not take any antihistamines 12 hours prior to your test.  On the Day of the Test: . Drink plenty of water. Do not drink any water within one hour of the test. . Do not eat any food 4 hours prior to the test. . You may take your regular medications prior to the test.  . Take metoprolol (Lopressor) two hours prior to test. . FEMALES- please wear underwire-free bra if available       After the Test: . Drink plenty of water. . After receiving IV contrast, you may experience a mild flushed feeling. This is normal. . On occasion, you may experience a mild rash up to 24 hours after the test.  This is not dangerous. If this occurs, you can take Benadryl 25 mg and increase your fluid intake. . If you experience trouble breathing, this can be serious. If it is severe call 911 IMMEDIATELY. If it is mild, please call our office. . If you take any of these medications: Glipizide/Metformin, Avandament, Glucavance, please do not take 48 hours after completing test unless otherwise instructed.   Once we have confirmed authorization from your insurance company, we will call you to set up a date and time for your test.    For non-scheduling related questions, please contact the cardiac imaging nurse navigator should you have any questions/concerns: Marchia Bond, RN Navigator Cardiac Imaging [facility] [phone] office  For scheduling needs, including cancellations and rescheduling, please call [phone].

## 2020-03-26 DIAGNOSIS — Z01812 Encounter for preprocedural laboratory examination: Secondary | ICD-10-CM | POA: Diagnosis not present

## 2020-03-26 LAB — BASIC METABOLIC PANEL
BUN/Creatinine Ratio: 14 (ref 12–28)
BUN: 14 mg/dL (ref 8–27)
CO2: 22 mmol/L (ref 20–29)
Calcium: 9.4 mg/dL (ref 8.7–10.3)
Chloride: 104 mmol/L (ref 96–106)
Creatinine, Ser: 1.02 mg/dL — ABNORMAL HIGH (ref 0.57–1.00)
GFR calc Af Amer: 62 mL/min/{1.73_m2} (ref >59)
GFR calc non Af Amer: 54 mL/min/{1.73_m2} — ABNORMAL LOW (ref >59)
Glucose: 88 mg/dL (ref 65–99)
Potassium: 5.1 mmol/L (ref 3.5–5.2)
Sodium: 141 mmol/L (ref 134–144)

## 2020-04-03 ENCOUNTER — Telehealth (HOSPITAL_COMMUNITY): Payer: Self-pay | Admitting: Emergency Medicine

## 2020-04-03 ENCOUNTER — Encounter (HOSPITAL_COMMUNITY): Payer: Self-pay

## 2020-04-03 NOTE — Telephone Encounter (Signed)
Pt returning phone call regarding upcoming cardiac imaging study; pt verbalizes understanding of appt date/time, parking situation and where to check in, pre-test NPO status and medications ordered, and verified current allergies; name and call back number provided for further questions should they arise Jamani Bearce RN Navigator Cardiac Imaging [facility] [phone] office [phone] cell   

## 2020-04-03 NOTE — Telephone Encounter (Signed)
Attempted to call patient regarding upcoming cardiac CT appointment. °Left message on voicemail with name and callback number °Dehlia Kilner RN Navigator Cardiac Imaging °[facility] °[phone] Office °[phone] Cell ° °

## 2020-04-04 ENCOUNTER — Telehealth: Payer: Self-pay | Admitting: Internal Medicine

## 2020-04-04 ENCOUNTER — Other Ambulatory Visit: Payer: Self-pay

## 2020-04-04 ENCOUNTER — Ambulatory Visit (HOSPITAL_COMMUNITY)
Admission: RE | Admit: 2020-04-04 | Discharge: 2020-04-04 | Disposition: A | Discharge status: Home / Self Care | Payer: Medicare HMO | Source: Ambulatory Visit | Attending: Internal Medicine | Admitting: Internal Medicine

## 2020-04-04 DIAGNOSIS — Z6823 Body mass index (BMI) 23.0-23.9, adult: Secondary | ICD-10-CM | POA: Diagnosis not present

## 2020-04-04 DIAGNOSIS — R072 Precordial pain: Secondary | ICD-10-CM | POA: Diagnosis not present

## 2020-04-04 DIAGNOSIS — I2510 Atherosclerotic heart disease of native coronary artery without angina pectoris: Secondary | ICD-10-CM | POA: Diagnosis not present

## 2020-04-04 MED ORDER — IOHEXOL 350 MG/ML SOLN
80.0000 mL | Freq: Once | INTRAVENOUS | Status: AC | PRN
  Administered 2020-04-04: 80 mL via INTRAVENOUS

## 2020-04-04 MED ORDER — NITROGLYCERIN 0.4 MG SL SUBL
SUBLINGUAL_TABLET | SUBLINGUAL | Status: AC
  Filled 2020-04-04: qty 2

## 2020-04-04 MED ORDER — NITROGLYCERIN 0.4 MG SL SUBL: 0.8000 mg | SUBLINGUAL_TABLET | Freq: Once | SUBLINGUAL | Status: DC

## 2020-04-04 NOTE — Telephone Encounter (Signed)
New Message     Wendy Patrick is Calling with a call report from the pts CT

## 2020-04-04 NOTE — Telephone Encounter (Signed)
Yes .. I saw the radiologist report on this. I have read her coronary CT - still waiting on the FFR portion. Ok to order contrast CT chest to evaluate adenopathy - would wait until next week to allow her to get over the contrast that she got for this CT.  Thanks.  DR H

## 2020-04-04 NOTE — Telephone Encounter (Signed)
Opal Sidles called into triage from Radiology to report the following on the patient's cardiac ct:  IMPRESSION: 1. Subcarinal adenopathy. Consider full CT chest with contrast in further evaluation as malignancy cannot be excluded. These results will be called to the ordering clinician or representative by the Radiologist Assistant, and communication documented in the PACS or [employer].  Message routed to the provider.

## 2020-04-05 DIAGNOSIS — Z6823 Body mass index (BMI) 23.0-23.9, adult: Secondary | ICD-10-CM | POA: Diagnosis not present

## 2020-04-05 DIAGNOSIS — I2510 Atherosclerotic heart disease of native coronary artery without angina pectoris: Secondary | ICD-10-CM | POA: Diagnosis not present

## 2020-04-13 ENCOUNTER — Ambulatory Visit (INDEPENDENT_AMBULATORY_CARE_PROVIDER_SITE_OTHER): Payer: Medicare HMO | Admitting: Internal Medicine

## 2020-04-13 ENCOUNTER — Other Ambulatory Visit: Payer: Self-pay

## 2020-04-13 ENCOUNTER — Other Ambulatory Visit: Payer: Self-pay | Admitting: Internal Medicine

## 2020-04-13 ENCOUNTER — Encounter: Payer: Self-pay | Admitting: Internal Medicine

## 2020-04-13 VITALS — BP 152/78 | HR 71 | Temp 95.9°F | Ht 64.0 in | Wt 134.2 lb

## 2020-04-13 DIAGNOSIS — Z01812 Encounter for preprocedural laboratory examination: Secondary | ICD-10-CM | POA: Diagnosis not present

## 2020-04-13 DIAGNOSIS — R599 Enlarged lymph nodes, unspecified: Secondary | ICD-10-CM | POA: Diagnosis not present

## 2020-04-13 DIAGNOSIS — I2510 Atherosclerotic heart disease of native coronary artery without angina pectoris: Secondary | ICD-10-CM | POA: Diagnosis not present

## 2020-04-13 DIAGNOSIS — T466X5A Adverse effect of antihyperlipidemic and antiarteriosclerotic drugs, initial encounter: Secondary | ICD-10-CM

## 2020-04-13 DIAGNOSIS — Z79899 Other long term (current) drug therapy: Secondary | ICD-10-CM | POA: Diagnosis not present

## 2020-04-13 DIAGNOSIS — M791 Myalgia, unspecified site: Secondary | ICD-10-CM

## 2020-04-13 DIAGNOSIS — R0602 Shortness of breath: Secondary | ICD-10-CM

## 2020-04-13 DIAGNOSIS — J438 Other emphysema: Secondary | ICD-10-CM | POA: Diagnosis not present

## 2020-04-13 DIAGNOSIS — R079 Chest pain, unspecified: Secondary | ICD-10-CM

## 2020-04-13 LAB — CBC
Hematocrit: 44.5 % (ref 34.0–46.6)
Hemoglobin: 14.3 g/dL (ref 11.1–15.9)
MCH: 26.9 pg (ref 26.6–33.0)
MCHC: 32.1 g/dL (ref 31.5–35.7)
MCV: 84 fL (ref 79–97)
Platelets: 375 10*3/uL (ref 150–450)
RBC: 5.31 x10E6/uL — ABNORMAL HIGH (ref 3.77–5.28)
RDW: 12.7 % (ref 11.7–15.4)
WBC: 6.5 10*3/uL (ref 3.4–10.8)

## 2020-04-13 LAB — BASIC METABOLIC PANEL
BUN/Creatinine Ratio: 15 (ref 12–28)
BUN: 15 mg/dL (ref 8–27)
CO2: 24 mmol/L (ref 20–29)
Calcium: 9.7 mg/dL (ref 8.7–10.3)
Chloride: 102 mmol/L (ref 96–106)
Creatinine, Ser: 0.98 mg/dL (ref 0.57–1.00)
GFR calc Af Amer: 65 mL/min/{1.73_m2} (ref >59)
GFR calc non Af Amer: 57 mL/min/{1.73_m2} — ABNORMAL LOW (ref >59)
Glucose: 89 mg/dL (ref 65–99)
Potassium: 5.1 mmol/L (ref 3.5–5.2)
Sodium: 141 mmol/L (ref 134–144)

## 2020-04-13 NOTE — Progress Notes (Signed)
OFFICE NOTE  Chief Complaint:  Infrequent chest pain  Primary Care Physician: Hoyt Koch, MD  HPI:  Wendy Patrick is a 74 y.o. female with a past medial history significant for mild coronary artery disease with a 40% circumflex stenosis in 2010, COPD, type 2 diabetes, hyperlipidemia and statin intolerance.  She was previously followed by Dr. Radford Pax however requested to switch to my care since her husband is a patient of mine.  We will try to see them simultaneously.  Recently she has been having some issues with diarrhea.  She is been undergoing treatment with antibiotics and care by Dr. Watt Climes.  She feels drained.  She is also congested and having difficulty with allergies and chronic cough.  She reports labile blood pressures from the upper 90s to 140s, but has no history of hypertension and is not on medication.  In fact she takes only aspirin.  She has been previously seen by one of our PharmD's in the lipid clinic to discuss her dyslipidemia and was offered ezetimibe, but did not take the medication due to cost issues.  Currently she reports some discomfort in the left shoulder arm and left back.  This is similar but much less significant to the discomfort she had in 2010.  She says she gets it occasionally.  Is not necessarily worse with exertion or relieved by rest and does not sound particularly anginal to me.  She did have a nuclear stress test last year which was completely normal.  11/10/2018  Wendy Patrick returns today for routine follow-up.  She does have a history of coronary artery disease with some circumflex stenosis of 40% in 2010, type 2 diabetes, hyperlipidemia and statin intolerance with a goal LDL less than 70.  Her main issues have been with some degree of neuropathy for which she is on gabapentin.  She says she is not tolerating this well and feels tired and is having some mood changes related to that.  Unfortunately she has been statin intolerant.  She has been on both  atorvastatin and rosuvastatin which caused leg cramps.  We had discussed PCSK9 inhibitor therapy in the past however had not pursued it.  Her most recent lab work from June 2018 showed total cholesterol 270, HDL 60, LDL 178 and triglycerides 158.  Although her diet sound somewhat atherogenic, there may be a component of FH.   07/26/2019  Wendy Patrick is seen today in follow-up.  She is also been using Repatha.  Overall she seems to be tolerating however she has been having some concerns with runny nose and persistent cough.  This is atypical for the medication and she was concerned though it may be a side effect.  She needs a recheck of her lipids.  She was on gabapentin for neuropathy but this is been discontinued and has been seeing neurologist.  There was concern that she may have some nerve damage from an injection in her teeth that may be related to her cough and some possible difficulty swallowing.  02/22/2020  Wendy Patrick is seen today in follow-up with her husband. Unfortunately she stopped using Repatha. She had had a good response in her LDL cholesterol which went from 171 down to 119. I suspect that she could have FH as there is a strong family history of heart disease in her parents. She has had no cardiac symptoms that I was aware of however recently did have some left-sided chest discomfort. It apparently was pretty persistent and lasted for  couple hours. It was not associated with exertion or relieved by rest however did radiate into her left arm. According to her husband she had had a cardiovascular work-up by Dr. Fletcher Anon in the past which showed mild nonobstructive coronary disease with up to about a 40% stenosis. This was in 2010.  04/13/2020  Wendy Patrick is seen today in follow-up.  She underwent CT with FFR for ongoing chest pain on Apr 04, 2020.  I personally reviewed the study and demonstrated at least moderate mixed coronary disease in the circumflex with mild to moderate mixed coronary disease  of the LAD and RCA.  Coronary calcium score was 252, 76 percentile for age and sex matched control.  CT FFR was performed which demonstrated a significant flow-limiting mid to distal circumflex stenosis.  The FFR dropped suddenly from 0.97-0.62 suggesting a focal stenosis.  She had previously had a 40% stenosis in 2010 in this area and I suspect that has progressed.  Unfortunately she has been statin intolerant.  She also had been on Repatha but stopped it due to side effects.  In addition the over read of the CT suggested emphysema as well as subcarinal adenopathy.  She had previously seen Dr. Melvyn Novas with pulmonary who performed some chest x-rays and had been treating her for cough which did not improve.  She would like to follow up with a different pulmonologist.  I will refer her.  PMHx:  Past Medical History:  Diagnosis Date  . COPD (chronic obstructive pulmonary disease) ([place])   . Coronary artery disease 08/2009   40% mid left circ by cath in [place], [place]  . Endometriosis    1986  . Gestational diabetes   . Granuloma annulare   . Hyperlipidemia   . Optic neuritis    2015  . Optic neuritis Novembr  . Vaginal anomaly    tears and bleeds easily    Past Surgical History:  Procedure Laterality Date  . ABDOMINAL HYSTERECTOMY    . CARDIAC CATHETERIZATION    . COLONOSCOPY    . EYE SURGERY    . MANDIBLE SURGERY    . MINOR HEMORRHOIDECTOMY    . TUBAL LIGATION    . UPPER GASTROINTESTINAL ENDOSCOPY      FAMHx:  Family History  Problem Relation Age of Onset  . Cancer Mother        Breast   . Breast cancer Mother   . Stroke Brother        open heart surgery   . Colon cancer Neg Hx   . Esophageal cancer Neg Hx   . Rectal cancer Neg Hx   . Stomach cancer Neg Hx     SOCHx:   reports that she quit smoking about 20 years ago. Her smoking use included cigarettes. She has a 100.00 pack-year smoking history. She has never used smokeless tobacco. She reports current alcohol use of about  3.0 standard drinks of alcohol per week. She reports that she does not use drugs.  ALLERGIES:  Allergies  Allergen Reactions  . Statins     Leg cramps  . Crestor [Rosuvastatin] Other (See Comments)    Leg cramps  . Latex Rash    ROS: Pertinent items noted in HPI and remainder of comprehensive ROS otherwise negative.  HOME MEDS: Current Outpatient Medications on File Prior to Visit  Medication Sig Dispense Refill  . aspirin EC 81 MG tablet Take 81 mg by mouth daily.    . cholecalciferol (VITAMIN D) 1000 units tablet Take  1,000 Units by mouth daily.    Marland Kitchen KRILL OIL PO Take 250 mg by mouth daily.    . metoprolol tartrate (LOPRESSOR) 100 MG tablet Take 1 tablet (100 mg total) by mouth once for 1 dose. Take 2 hours prior to test 1 tablet 0   No current facility-administered medications on file prior to visit.    LABS/IMAGING: No results found for this or any previous visit (from the past 48 hour(s)). No results found.  LIPID PANEL:    Component Value Date/Time   CHOL 214 (H) 07/26/2019 0949   TRIG 103 07/26/2019 0949   HDL 74 07/26/2019 0949   CHOLHDL 2.9 07/26/2019 0949   CHOLHDL 4 06/05/2016 0843   VLDL 35.8 06/05/2016 0843   LDLCALC 119 (H) 07/26/2019 0949     WEIGHTS: Wt Readings from Last 3 Encounters:  04/13/20 134 lb 3.2 oz (60.9 kg)  02/22/20 135 lb 12.8 oz (61.6 kg)  10/03/19 132 lb (59.9 kg)    VITALS: BP (!) 152/78   Pulse 71   Temp (!) 95.9 F (35.5 C)   Ht 5\' 4"  (1.626 m)   Wt 134 lb 3.2 oz (60.9 kg)   SpO2 97%   BMI 23.04 kg/m   EXAM: Deferred  EKG: Deferred  ASSESSMENT: 1. Chest pain -abnormal CT FFR suggesting flow-limiting stenosis of the mid to distal circumflex 2. Mild nonobstructive coronary disease by cath (2010) -40% mid circumflex lesion 3. Dyslipidemia-statin intolerant 4. Emphysema 5. Subcarinal lymphadenopathy  PLAN: 1.   Mrs. Liedel had an abnormal CT with FFR suggesting flow-limiting stenosis of the mid to distal circumflex  artery.  This is an area where she had a 40% lesion in 2010 by cath.  She has been statin intolerant and not able to take Repatha and I suspect has had progression of her coronary disease.  I would recommend a definitive left heart catheterization with possible PCI.  We discussed the risk, benefits and alternatives of the procedure today she is agreeable to proceed.  In addition, based on her extracardiac findings of emphysema as well as some subcarinal adenopathy on CT, will refer her back to pulmonary to see Dr. Valeta Harms or one of the other partners.  She would not like to see Dr. Melvyn Novas again.  Plan follow-up with me after catheterization.  Pixie Casino, MD, [facility] [place] Director of the Advanced Lipid Disorders &  Cardiovascular Risk Reduction Clinic Diplomate of the American Board of Clinical Lipidology Attending Cardiologist  Direct Dial: [phone]  Fax: [phone]  Website:  [email or web address] 04/13/2020, 10:01 AM

## 2020-04-13 NOTE — H&P (View-Only) (Signed)
OFFICE NOTE  Chief Complaint:  Infrequent chest pain  Primary Care Physician: Hoyt Koch, MD  HPI:  Wendy Patrick is a 74 y.o. female with a past medial history significant for mild coronary artery disease with a 40% circumflex stenosis in 2010, COPD, type 2 diabetes, hyperlipidemia and statin intolerance.  She was previously followed by Dr. Radford Pax however requested to switch to my care since her husband is a patient of mine.  We will try to see them simultaneously.  Recently she has been having some issues with diarrhea.  She is been undergoing treatment with antibiotics and care by Dr. Watt Climes.  She feels drained.  She is also congested and having difficulty with allergies and chronic cough.  She reports labile blood pressures from the upper 90s to 140s, but has no history of hypertension and is not on medication.  In fact she takes only aspirin.  She has been previously seen by one of our PharmD's in the lipid clinic to discuss her dyslipidemia and was offered ezetimibe, but did not take the medication due to cost issues.  Currently she reports some discomfort in the left shoulder arm and left back.  This is similar but much less significant to the discomfort she had in 2010.  She says she gets it occasionally.  Is not necessarily worse with exertion or relieved by rest and does not sound particularly anginal to me.  She did have a nuclear stress test last year which was completely normal.  11/10/2018  Wendy Patrick returns today for routine follow-up.  She does have a history of coronary artery disease with some circumflex stenosis of 40% in 2010, type 2 diabetes, hyperlipidemia and statin intolerance with a goal LDL less than 70.  Her main issues have been with some degree of neuropathy for which she is on gabapentin.  She says she is not tolerating this well and feels tired and is having some mood changes related to that.  Unfortunately she has been statin intolerant.  She has been on both  atorvastatin and rosuvastatin which caused leg cramps.  We had discussed PCSK9 inhibitor therapy in the past however had not pursued it.  Her most recent lab work from June 2018 showed total cholesterol 270, HDL 60, LDL 178 and triglycerides 158.  Although her diet sound somewhat atherogenic, there may be a component of FH.   07/26/2019  Wendy Patrick is seen today in follow-up.  She is also been using Repatha.  Overall she seems to be tolerating however she has been having some concerns with runny nose and persistent cough.  This is atypical for the medication and she was concerned though it may be a side effect.  She needs a recheck of her lipids.  She was on gabapentin for neuropathy but this is been discontinued and has been seeing neurologist.  There was concern that she may have some nerve damage from an injection in her teeth that may be related to her cough and some possible difficulty swallowing.  02/22/2020  Wendy Patrick is seen today in follow-up with her husband. Unfortunately she stopped using Repatha. She had had a good response in her LDL cholesterol which went from 171 down to 119. I suspect that she could have FH as there is a strong family history of heart disease in her parents. She has had no cardiac symptoms that I was aware of however recently did have some left-sided chest discomfort. It apparently was pretty persistent and lasted for  couple hours. It was not associated with exertion or relieved by rest however did radiate into her left arm. According to her husband she had had a cardiovascular work-up by Dr. Fletcher Anon in the past which showed mild nonobstructive coronary disease with up to about a 40% stenosis. This was in 2010.  04/13/2020  Wendy Patrick is seen today in follow-up.  She underwent CT with FFR for ongoing chest pain on Apr 04, 2020.  I personally reviewed the study and demonstrated at least moderate mixed coronary disease in the circumflex with mild to moderate mixed coronary disease  of the LAD and RCA.  Coronary calcium score was 252, 76 percentile for age and sex matched control.  CT FFR was performed which demonstrated a significant flow-limiting mid to distal circumflex stenosis.  The FFR dropped suddenly from 0.97-0.62 suggesting a focal stenosis.  She had previously had a 40% stenosis in 2010 in this area and I suspect that has progressed.  Unfortunately she has been statin intolerant.  She also had been on Repatha but stopped it due to side effects.  In addition the over read of the CT suggested emphysema as well as subcarinal adenopathy.  She had previously seen Dr. Melvyn Novas with pulmonary who performed some chest x-rays and had been treating her for cough which did not improve.  She would like to follow up with a different pulmonologist.  I will refer her.  PMHx:  Past Medical History:  Diagnosis Date  . COPD (chronic obstructive pulmonary disease) ([place])   . Coronary artery disease 08/2009   40% mid left circ by cath in [place], [place]  . Endometriosis    1986  . Gestational diabetes   . Granuloma annulare   . Hyperlipidemia   . Optic neuritis    2015  . Optic neuritis Nove[place]  . Vaginal anomaly    tears and bleeds easily    Past Surgical History:  Procedure Laterality Date  . ABDOMINAL HYSTERECTOMY    . CARDIAC CATHETERIZATION    . COLONOSCOPY    . EYE SURGERY    . MANDIBLE SURGERY    . MINOR HEMORRHOIDECTOMY    . TUBAL LIGATION    . UPPER GASTROINTESTINAL ENDOSCOPY      FAMHx:  Family History  Problem Relation Age of Onset  . Cancer Mother        Breast   . Breast cancer Mother   . Stroke Brother        open heart surgery   . Colon cancer Neg Hx   . Esophageal cancer Neg Hx   . Rectal cancer Neg Hx   . Stomach cancer Neg Hx     SOCHx:   reports that she quit smoking about 20 years ago. Her smoking use included cigarettes. She has a 100.00 pack-year smoking history. She has never used smokeless tobacco. She reports current alcohol use of about  3.0 standard drinks of alcohol per week. She reports that she does not use drugs.  ALLERGIES:  Allergies  Allergen Reactions  . Statins     Leg cramps  . Crestor [Rosuvastatin] Other (See Comments)    Leg cramps  . Latex Rash    ROS: Pertinent items noted in HPI and remainder of comprehensive ROS otherwise negative.  HOME MEDS: Current Outpatient Medications on File Prior to Visit  Medication Sig Dispense Refill  . aspirin EC 81 MG tablet Take 81 mg by mouth daily.    . cholecalciferol (VITAMIN D) 1000 units tablet Take  1,000 Units by mouth daily.    Marland Kitchen KRILL OIL PO Take 250 mg by mouth daily.    . metoprolol tartrate (LOPRESSOR) 100 MG tablet Take 1 tablet (100 mg total) by mouth once for 1 dose. Take 2 hours prior to test 1 tablet 0   No current facility-administered medications on file prior to visit.    LABS/IMAGING: No results found for this or any previous visit (from the past 48 hour(s)). No results found.  LIPID PANEL:    Component Value Date/Time   CHOL 214 (H) 07/26/2019 0949   TRIG 103 07/26/2019 0949   HDL 74 07/26/2019 0949   CHOLHDL 2.9 07/26/2019 0949   CHOLHDL 4 06/05/2016 0843   VLDL 35.8 06/05/2016 0843   LDLCALC 119 (H) 07/26/2019 0949     WEIGHTS: Wt Readings from Last 3 Encounters:  04/13/20 134 lb 3.2 oz (60.9 kg)  02/22/20 135 lb 12.8 oz (61.6 kg)  10/03/19 132 lb (59.9 kg)    VITALS: BP (!) 152/78   Pulse 71   Temp (!) 95.9 F (35.5 C)   Ht 5\' 4"  (1.626 m)   Wt 134 lb 3.2 oz (60.9 kg)   SpO2 97%   BMI 23.04 kg/m   EXAM: Deferred  EKG: Deferred  ASSESSMENT: 1. Chest pain -abnormal CT FFR suggesting flow-limiting stenosis of the mid to distal circumflex 2. Mild nonobstructive coronary disease by cath (2010) -40% mid circumflex lesion 3. Dyslipidemia-statin intolerant 4. Emphysema 5. Subcarinal lymphadenopathy  PLAN: 1.   Mrs. Biddle had an abnormal CT with FFR suggesting flow-limiting stenosis of the mid to distal circumflex  artery.  This is an area where she had a 40% lesion in 2010 by cath.  She has been statin intolerant and not able to take Repatha and I suspect has had progression of her coronary disease.  I would recommend a definitive left heart catheterization with possible PCI.  We discussed the risk, benefits and alternatives of the procedure today she is agreeable to proceed.  In addition, based on her extracardiac findings of emphysema as well as some subcarinal adenopathy on CT, will refer her back to pulmonary to see Dr. Valeta Harms or one of the other partners.  She would not like to see Dr. Melvyn Novas again.  Plan follow-up with me after catheterization.  Pixie Casino, MD, [facility], [place] Director of the Advanced Lipid Disorders &  Cardiovascular Risk Reduction Clinic Diplomate of the American Board of Clinical Lipidology Attending Cardiologist  Direct Dial: [phone]  Fax: [phone]  Website:  [email or web address] 04/13/2020, 10:01 AM

## 2020-04-13 NOTE — Patient Instructions (Signed)
Medication Instructions:  NO CHANGES *If you need a refill on your cardiac medications before your next appointment, please call your pharmacy*   Lab Work: BMET & CBC today  You will need to have the coronavirus test completed prior to your procedure. An appointment has been made at 10:25am on Saturday May 15. This is a Drive Up Visit at the [facility] [address] Please tell them that you are there for pre-procedure testing. Someone will direct you to the appropriate testing line. Stay in your car and someone will be with you shortly. Please make sure to have all other labs completed before this test because you will need to stay quarantined until your procedure. Please take your insurance card to this test.   If you have labs (blood work) drawn today and your tests are completely normal, you will receive your results only by: Marland Kitchen MyChart Message (if you have MyChart) OR . A paper copy in the mail If you have any lab test that is abnormal or we need to change your treatment, we will call you to review the results.   Testing/Procedures: Heart Catheterization @ [facility] on Tuesday May 18 with Dr. Ellyn Hack    Follow-Up: At [facility], you and your health needs are our priority.  As part of our continuing mission to provide you with exceptional heart care, we have created designated Provider Care Teams.  These Care Teams include your primary Cardiologist (physician) and Advanced Practice Providers (APPs -  Physician Assistants and Nurse Practitioners) who all work together to provide you with the care you need, when you need it.  We recommend signing up for the patient portal called "MyChart".  Sign up information is provided on this After Visit Summary.  MyChart is used to connect with patients for Virtual Visits (Telemedicine).  Patients are able to view lab/test results, encounter notes, upcoming appointments, etc.  Non-urgent messages can be sent to your provider as  well.   To learn more about what you can do with MyChart, go to [email or web address].    Your next appointment:   4 week(s)  The format for your next appointment:   In Person  Provider:   Dr. Debara Pickett or APP on his care team - [place] PA - Roby Lofts PA   Other Instructions  You have been referred to June Leap, DO with [facility]

## 2020-04-14 ENCOUNTER — Other Ambulatory Visit (HOSPITAL_COMMUNITY)
Admission: RE | Admit: 2020-04-14 | Discharge: 2020-04-14 | Disposition: A | Discharge status: Home / Self Care | Payer: Medicare HMO | Source: Ambulatory Visit | Attending: Cardiology | Admitting: Cardiology

## 2020-04-14 DIAGNOSIS — Z01812 Encounter for preprocedural laboratory examination: Secondary | ICD-10-CM | POA: Insufficient documentation

## 2020-04-14 DIAGNOSIS — Z20822 Contact with and (suspected) exposure to covid-19: Secondary | ICD-10-CM | POA: Diagnosis not present

## 2020-04-14 LAB — SARS CORONAVIRUS 2 (TAT 6-24 HRS): SARS Coronavirus 2: NEGATIVE

## 2020-04-16 ENCOUNTER — Telehealth: Payer: Self-pay | Admitting: *Deleted

## 2020-04-16 NOTE — Telephone Encounter (Signed)
Pt contacted pre-catheterization scheduled at [facility] for: Tuesday Apr 17, 2020 9 AM Verified arrival time and place: [facility]) at: 7 AM   No solid food after midnight prior to cath, clear liquids until 5 AM day of procedure.   AM meds can be  taken pre-cath with sip of water including: ASA 81 mg   Confirmed patient has responsible adult to drive home post procedure and observe 24 hours after arriving home: yes  You are allowed ONE visitor in the waiting room during your procedures. Both you and your visitor must wear masks.      COVID-19 Pre-Screening Questions:  . In the past 7 to 10 days have you had a cough,  shortness of breath, headache, congestion, fever (100 or greater) body aches, chills, sore throat, or sudden loss of taste or sense of smell? Cough, not new  . Have you been around anyone with known Covid 19 in the past 7 to 10 days? no . Have you been around anyone who is awaiting Covid 19 test results in the past 7 to 10 days? no . Have you been around anyone who has mentioned symptoms of Covid 19 within the past 7 to 10 days? no  Reviewed procedure/mask/visitor instructions, COVID-19 screening questions with patient.

## 2020-04-17 ENCOUNTER — Ambulatory Visit (HOSPITAL_COMMUNITY)
Admission: RE | Admit: 2020-04-17 | Discharge: 2020-04-17 | Disposition: A | Discharge status: Home / Self Care | Payer: Medicare HMO | Attending: Cardiovascular Disease | Admitting: Cardiovascular Disease

## 2020-04-17 ENCOUNTER — Ambulatory Visit (HOSPITAL_COMMUNITY)
Admission: RE | Disposition: A | Discharge status: Home / Self Care | Payer: Self-pay | Source: Home / Self Care | Attending: Cardiovascular Disease

## 2020-04-17 ENCOUNTER — Other Ambulatory Visit: Payer: Self-pay

## 2020-04-17 DIAGNOSIS — E1140 Type 2 diabetes mellitus with diabetic neuropathy, unspecified: Secondary | ICD-10-CM | POA: Insufficient documentation

## 2020-04-17 DIAGNOSIS — Z888 Allergy status to other drugs, medicaments and biological substances status: Secondary | ICD-10-CM | POA: Insufficient documentation

## 2020-04-17 DIAGNOSIS — R0602 Shortness of breath: Secondary | ICD-10-CM

## 2020-04-17 DIAGNOSIS — R590 Localized enlarged lymph nodes: Secondary | ICD-10-CM | POA: Insufficient documentation

## 2020-04-17 DIAGNOSIS — Z7982 Long term (current) use of aspirin: Secondary | ICD-10-CM | POA: Insufficient documentation

## 2020-04-17 DIAGNOSIS — R079 Chest pain, unspecified: Secondary | ICD-10-CM

## 2020-04-17 DIAGNOSIS — R931 Abnormal findings on diagnostic imaging of heart and coronary circulation: Secondary | ICD-10-CM | POA: Diagnosis present

## 2020-04-17 DIAGNOSIS — E785 Hyperlipidemia, unspecified: Secondary | ICD-10-CM | POA: Diagnosis not present

## 2020-04-17 DIAGNOSIS — Z79899 Other long term (current) drug therapy: Secondary | ICD-10-CM | POA: Insufficient documentation

## 2020-04-17 DIAGNOSIS — J449 Chronic obstructive pulmonary disease, unspecified: Secondary | ICD-10-CM | POA: Insufficient documentation

## 2020-04-17 DIAGNOSIS — I2510 Atherosclerotic heart disease of native coronary artery without angina pectoris: Secondary | ICD-10-CM | POA: Diagnosis not present

## 2020-04-17 DIAGNOSIS — Z87891 Personal history of nicotine dependence: Secondary | ICD-10-CM | POA: Diagnosis not present

## 2020-04-17 HISTORY — PX: LEFT HEART CATH AND CORONARY ANGIOGRAPHY: CATH118249

## 2020-04-17 LAB — POCT ACTIVATED CLOTTING TIME: Activated Clotting Time: 230 seconds

## 2020-04-17 SURGERY — LEFT HEART CATH AND CORONARY ANGIOGRAPHY
Anesthesia: LOCAL

## 2020-04-17 MED ORDER — ASPIRIN 81 MG PO CHEW: 81.0000 mg | CHEWABLE_TABLET | ORAL | Status: DC

## 2020-04-17 MED ORDER — HEPARIN (PORCINE) IN NACL 1000-0.9 UT/500ML-% IV SOLN
INTRAVENOUS | Status: AC
  Filled 2020-04-17: qty 500

## 2020-04-17 MED ORDER — LABETALOL HCL 5 MG/ML IV SOLN: 10.0000 mg | INTRAVENOUS | Status: DC | PRN

## 2020-04-17 MED ORDER — HEPARIN SODIUM (PORCINE) 1000 UNIT/ML IJ SOLN
INTRAMUSCULAR | Status: AC
  Filled 2020-04-17: qty 1

## 2020-04-17 MED ORDER — SODIUM CHLORIDE 0.9 % IV SOLN: INTRAVENOUS | Status: DC

## 2020-04-17 MED ORDER — VERAPAMIL HCL 2.5 MG/ML IV SOLN
INTRAVENOUS | Status: AC
  Filled 2020-04-17: qty 2

## 2020-04-17 MED ORDER — FENTANYL CITRATE (PF) 100 MCG/2ML IJ SOLN
INTRAMUSCULAR | Status: DC | PRN
  Administered 2020-04-17 (×2): 25 ug via INTRAVENOUS

## 2020-04-17 MED ORDER — FENTANYL CITRATE (PF) 100 MCG/2ML IJ SOLN
INTRAMUSCULAR | Status: AC
  Filled 2020-04-17: qty 2

## 2020-04-17 MED ORDER — SODIUM CHLORIDE 0.9% FLUSH: 3.0000 mL | INTRAVENOUS | Status: DC | PRN

## 2020-04-17 MED ORDER — ONDANSETRON HCL 4 MG/2ML IJ SOLN: 4.0000 mg | Freq: Four times a day (QID) | INTRAMUSCULAR | Status: DC | PRN

## 2020-04-17 MED ORDER — NITROGLYCERIN 1 MG/10 ML FOR IR/CATH LAB
INTRA_ARTERIAL | Status: AC
  Filled 2020-04-17: qty 10

## 2020-04-17 MED ORDER — ACETAMINOPHEN 325 MG PO TABS: 650.0000 mg | ORAL_TABLET | ORAL | Status: DC | PRN

## 2020-04-17 MED ORDER — HEPARIN (PORCINE) IN NACL 1000-0.9 UT/500ML-% IV SOLN
INTRAVENOUS | Status: DC | PRN
  Administered 2020-04-17 (×2): 500 mL

## 2020-04-17 MED ORDER — HEPARIN SODIUM (PORCINE) 1000 UNIT/ML IJ SOLN
INTRAMUSCULAR | Status: DC | PRN
  Administered 2020-04-17: 2000 [IU] via INTRAVENOUS
  Administered 2020-04-17: 3000 [IU] via INTRAVENOUS

## 2020-04-17 MED ORDER — LIDOCAINE HCL (PF) 1 % IJ SOLN
INTRAMUSCULAR | Status: DC | PRN
  Administered 2020-04-17: 2 mL

## 2020-04-17 MED ORDER — SODIUM CHLORIDE 0.9 % WEIGHT BASED INFUSION: 1.0000 mL/kg/h | INTRAVENOUS | Status: DC

## 2020-04-17 MED ORDER — HYDRALAZINE HCL 20 MG/ML IJ SOLN: 10.0000 mg | INTRAMUSCULAR | Status: DC | PRN

## 2020-04-17 MED ORDER — MIDAZOLAM HCL 2 MG/2ML IJ SOLN
INTRAMUSCULAR | Status: AC
  Filled 2020-04-17: qty 2

## 2020-04-17 MED ORDER — SODIUM CHLORIDE 0.9% FLUSH: 3.0000 mL | Freq: Two times a day (BID) | INTRAVENOUS | Status: DC

## 2020-04-17 MED ORDER — SODIUM CHLORIDE 0.9 % IV SOLN: 250.0000 mL | INTRAVENOUS | Status: DC | PRN

## 2020-04-17 MED ORDER — IOHEXOL 350 MG/ML SOLN
INTRAVENOUS | Status: DC | PRN
  Administered 2020-04-17: 50 mL

## 2020-04-17 MED ORDER — MIDAZOLAM HCL 2 MG/2ML IJ SOLN
INTRAMUSCULAR | Status: DC | PRN
  Administered 2020-04-17 (×2): 1 mg via INTRAVENOUS

## 2020-04-17 SURGICAL SUPPLY — 14 items
CATH 5FR JL3.5 JR4 ANG PIG MP (CATHETERS) ×2 IMPLANT
CATH LAUNCHER 5F EBU3.5 (CATHETERS) ×2 IMPLANT
DEVICE RAD COMP TR BAND LRG (VASCULAR PRODUCTS) ×2 IMPLANT
GLIDESHEATH SLEND SS 6F .021 (SHEATH) ×2 IMPLANT
GUIDEWIRE INQWIRE 1.5J.035X260 (WIRE) ×1 IMPLANT
GUIDEWIRE PRESSURE X 175 (WIRE) ×2 IMPLANT
INQWIRE 1.5J .035X260CM (WIRE) ×2
KIT ESSENTIALS PG (KITS) ×2 IMPLANT
KIT HEART LEFT (KITS) ×2 IMPLANT
PACK CARDIAC CATHETERIZATION (CUSTOM PROCEDURE TRAY) ×2 IMPLANT
SHEATH PROBE COVER 6X72 (BAG) ×2 IMPLANT
TRANSDUCER W/STOPCOCK (MISCELLANEOUS) ×2 IMPLANT
TUBING CIL FLEX 10 FLL-RA (TUBING) ×2 IMPLANT
WIRE HI TORQ VERSACORE-J 145CM (WIRE) ×2 IMPLANT

## 2020-04-17 NOTE — Interval H&P Note (Signed)
Cath Lab Visit (complete for each Cath Lab visit)  Clinical Evaluation Leading to the Procedure:   ACS: No.  Non-ACS:    Anginal Classification: CCS II  Anti-ischemic medical therapy: Minimal Therapy (1 class of medications)  Non-Invasive Test Results: Intermediate-risk stress test findings: cardiac mortality 1-3%/year  Prior CABG: No previous CABG      History and Physical Interval Note:  04/17/2020 10:30 AM  Wendy Patrick  has presented today for surgery, with the diagnosis of chest pain - shortness of breath.  The various methods of treatment have been discussed with the patient and family. After consideration of risks, benefits and other options for treatment, the patient has consented to  Procedure(s): LEFT HEART CATH AND CORONARY ANGIOGRAPHY (N/A) as a surgical intervention.  The patient's history has been reviewed, patient examined, no change in status, stable for surgery.  I have reviewed the patient's chart and labs.  Questions were answered to the patient's satisfaction.     Sherren Mocha

## 2020-04-17 NOTE — Progress Notes (Signed)
Discharge instructions reviewed with pt voices understanding. Attempted to call pt husband message left for him to return call

## 2020-04-17 NOTE — Discharge Instructions (Signed)
Radial Site Care  This sheet gives you information about how to care for yourself after your procedure. Your health care provider may also give you more specific instructions. If you have problems or questions, contact your health care provider. What can I expect after the procedure? After the procedure, it is common to have:  Bruising and tenderness at the catheter insertion area. Follow these instructions at home: Medicines  Take over-the-counter and prescription medicines only as told by your health care provider. Insertion site care  Follow instructions from your health care provider about how to take care of your insertion site. Make sure you: ? Wash your hands with soap and water before you change your bandage (dressing). If soap and water are not available, use hand sanitizer. ? Change your dressing as told by your health care provider. ? Leave stitches (sutures), skin glue, or adhesive strips in place. These skin closures may need to stay in place for 2 weeks or longer. If adhesive strip edges start to loosen and curl up, you may trim the loose edges. Do not remove adhesive strips completely unless your health care provider tells you to do that.  Check your insertion site every day for signs of infection. Check for: ? Redness, swelling, or pain. ? Fluid or blood. ? Pus or a bad smell. ? Warmth.  Do not take baths, swim, or use a hot tub until your health care provider approves.  You may shower 24-48 hours after the procedure, or as directed by your health care provider. ? Remove the dressing and gently wash the site with plain soap and water. ? Pat the area dry with a clean towel. ? Do not rub the site. That could cause bleeding.  Do not apply powder or lotion to the site. Activity   For 24 hours after the procedure, or as directed by your health care provider: ? Do not flex or bend the affected arm. ? Do not push or pull heavy objects with the affected arm. ? Do not  drive yourself home from the hospital or clinic. You may drive 24 hours after the procedure unless your health care provider tells you not to. ? Do not operate machinery or power tools.  Do not lift anything that is heavier than 10 lb (4.5 kg), or the limit that you are told, until your health care provider says that it is safe.  Ask your health care provider when it is okay to: ? Return to work or school. ? Resume usual physical activities or sports. ? Resume sexual activity. General instructions  If the catheter site starts to bleed, raise your arm and put firm pressure on the site. If the bleeding does not stop, get help right away. This is a medical emergency.  If you went home on the same day as your procedure, a responsible adult should be with you for the first 24 hours after you arrive home.  Keep all follow-up visits as told by your health care provider. This is important. Contact a health care provider if:  You have a fever.  You have redness, swelling, or yellow drainage around your insertion site. Get help right away if:  You have unusual pain at the radial site.  The catheter insertion area swells very fast.  The insertion area is bleeding, and the bleeding does not stop when you hold steady pressure on the area.  Your arm or hand becomes pale, cool, tingly, or numb. These symptoms may represent a serious problem   that is an emergency. Do not wait to see if the symptoms will go away. Get medical help right away. Call your local emergency services (911 in the [place] Do not drive yourself to the hospital. Summary  After the procedure, it is common to have bruising and tenderness at the site.  Follow instructions from your health care provider about how to take care of your radial site wound. Check the wound every day for signs of infection.  Do not lift anything that is heavier than 10 lb (4.5 kg), or the limit that you are told, until your health care provider says  that it is safe. This information is not intended to replace advice given to you by your health care provider. Make sure you discuss any questions you have with your health care provider. Document Revised: 12/23/2017 Document Reviewed: 12/23/2017 Elsevier Patient Education  2020 [employer]  

## 2020-04-18 MED FILL — Nitroglycerin IV Soln 100 MCG/ML in D5W: INTRA_ARTERIAL | Qty: 10 | Status: AC

## 2020-04-18 MED FILL — Verapamil HCl IV Soln 2.5 MG/ML: INTRAVENOUS | Qty: 2 | Status: AC

## 2020-04-25 ENCOUNTER — Telehealth: Payer: Self-pay | Admitting: *Deleted

## 2020-04-25 NOTE — Telephone Encounter (Signed)
Pulmonology appointment schedule on 05/23/20,patient is aware of the date and time. Letter mailed.

## 2020-05-04 ENCOUNTER — Telehealth: Payer: Self-pay | Admitting: Physician Assistant

## 2020-05-04 NOTE — Telephone Encounter (Signed)
Called patient- advised it was okay for her husband to come to appointment on 06/09.

## 2020-05-04 NOTE — Telephone Encounter (Signed)
Patient would like for her husband to come with her to her appt, as she has trouble hearing.

## 2020-05-09 ENCOUNTER — Encounter: Payer: Self-pay | Admitting: Physician Assistant

## 2020-05-09 ENCOUNTER — Other Ambulatory Visit: Payer: Self-pay

## 2020-05-09 ENCOUNTER — Ambulatory Visit: Payer: Medicare HMO | Admitting: Physician Assistant

## 2020-05-09 VITALS — BP 98/56 | HR 81 | Temp 97.0°F | Ht 64.0 in | Wt 132.0 lb

## 2020-05-09 DIAGNOSIS — E785 Hyperlipidemia, unspecified: Secondary | ICD-10-CM

## 2020-05-09 DIAGNOSIS — J449 Chronic obstructive pulmonary disease, unspecified: Secondary | ICD-10-CM

## 2020-05-09 DIAGNOSIS — I2510 Atherosclerotic heart disease of native coronary artery without angina pectoris: Secondary | ICD-10-CM

## 2020-05-09 DIAGNOSIS — E119 Type 2 diabetes mellitus without complications: Secondary | ICD-10-CM | POA: Diagnosis not present

## 2020-05-09 NOTE — Patient Instructions (Signed)
Medication Instructions:  Your physician recommends that you continue on your current medications as directed. Please refer to the Current Medication list given to you today.  *If you need a refill on your cardiac medications before your next appointment, please call your pharmacy*  Lab Work: NONE ordered at this time of appointment   If you have labs (blood work) drawn today and your tests are completely normal, you will receive your results only by: . MyChart Message (if you have MyChart) OR . A paper copy in the mail If you have any lab test that is abnormal or we need to change your treatment, we will call you to review the results.  Testing/Procedures: NONE ordered at this time of appointment   Follow-Up: At [facility], you and your health needs are our priority.  As part of our continuing mission to provide you with exceptional heart care, we have created designated Provider Care Teams.  These Care Teams include your primary Cardiologist (physician) and Advanced Practice Providers (APPs -  Physician Assistants and Nurse Practitioners) who all work together to provide you with the care you need, when you need it.  Your next appointment:   3-4 month(s)  The format for your next appointment:   In Person  Provider:   K. Chad Hilty, MD  Other Instructions   

## 2020-05-09 NOTE — Progress Notes (Signed)
Cardiology Office Note:    Date:  05/11/2020   ID:  Wendy Patrick, DOB [birth year], MRN [id number]  PCP:  Wendy Koch, MD  [facility] Cardiologist:  Wendy Casino, MD  [place] Electrophysiologist:  None   Referring MD: Wendy Patrick, *   Chief Complaint  Patient presents with  . Follow-up    4 weeks. Pain on left side.  . Shortness of Breath  . Headache  . Chest Pain    History of Present Illness:    Wendy Patrick is a 74 y.o. female with a hx of CAD, prediabetes, HLD with statin intolerance, and COPD.  Patient was previously followed by Dr. Radford Patrick however later switched to Dr. Debara Patrick since her husband is also one of Dr. Lysbeth Penner patient.  She had a remote cardiac catheterization in 2010 that showed only 40% disease in the left circumflex territory, otherwise no significant CAD.  She is intolerant of Lipitor and Crestor due to leg cramps.  Zetia was initially recommended however she did not take this due to cost reasons.  Dr. Debara Patrick has also previously discussed PCSK9 inhibitor and she was placed on Repatha.  She had a negative Myoview in June 2018.  Due to recurrent chest pain, patient underwent coronary CT with FFR on 04/04/2020, this revealed mild to moderate mixed CAD of LAD and RCA.  Coronary calcium score was 252, which placed the patient at the 66 percentile for age and sex matched control.  FFR was significant for mid to distal left circumflex stenosis.  Cardiac catheterization eventually revealed nonobstructive 40% stenosis in the proximal to mid LAD and moderate nonobstructive 50% disease in the left circumflex. RFR study performed on the LCx was 0.93 nonischemic.  Medical therapy was recommended.  Patient presents today for cardiology office visit.  She continues to have intermittent chest pain that is not worse with deep inspiration, body rotation or palpation.  She has upcoming visit with Dr. Valeta Patrick of pulmonology service to rule out pulmonary cause.  Her  right wrist cath site is sore and has some bruising in the distal arm, otherwise I did not hear any bruit on physical exam nor did I feel any pulsatile mass.  I recommended continue observation for this.  Unfortunately, she says that she could not afford Repatha, I will need to check with Dr. Debara Patrick to see if alternative agent such as bempdoic acid will work for her.   Past Medical History:  Diagnosis Date  . COPD (chronic obstructive pulmonary disease) ([place])   . Coronary artery disease 08/2009   40% mid left circ by cath in [place], [place]  . Endometriosis    1986  . Gestational diabetes   . Granuloma annulare   . Hyperlipidemia   . Optic neuritis    2015  . Optic neuritis Novembr  . Vaginal anomaly    tears and bleeds easily    Past Surgical History:  Procedure Laterality Date  . ABDOMINAL HYSTERECTOMY    . CARDIAC CATHETERIZATION    . COLONOSCOPY    . EYE SURGERY    . LEFT HEART CATH AND CORONARY ANGIOGRAPHY N/A 04/17/2020   Procedure: LEFT HEART CATH AND CORONARY ANGIOGRAPHY;  Surgeon: Wendy Mocha, MD;  Location: [facility];  Service: Cardiovascular;  Laterality: N/A;  . MANDIBLE SURGERY    . MINOR HEMORRHOIDECTOMY    . TUBAL LIGATION    . UPPER GASTROINTESTINAL ENDOSCOPY      Current Medications: Current Meds  Medication  Sig  . aspirin EC 81 MG tablet Take 81 mg by mouth daily.  . cholecalciferol (VITAMIN D) 1000 units tablet Take 10,000 Units by mouth daily.   Marland Kitchen KRILL OIL PO Take 250 mg by mouth daily.  . Loratadine 10 MG CAPS Take 10 mg by mouth daily.  . Multiple Vitamins-Minerals (MULTIVITAMIN WITH MINERALS) tablet Take 6 tablets by mouth daily. Balance of nature 3- veggies 3- fruit     Allergies:   Statins, Crestor [rosuvastatin], and Latex   Social History   Socioeconomic History  . Marital status: Married    Spouse name: Wendy Patrick  . Number of children: 1  . Years of education: GED  . Highest education level: Not on file  Occupational History    . Occupation: Retired  Tobacco Use  . Smoking status: Former Smoker    Packs/day: 2.00    Years: 50.00    Pack years: 100.00    Types: Cigarettes    Quit date: 02/05/2000    Years since quitting: 20.2  . Smokeless tobacco: Never Used  . Tobacco comment: Educated LDCT for lung Ca to discuss with MD if interested  Vaping Use  . Vaping Use: Never used  Substance and Sexual Activity  . Alcohol use: Yes    Alcohol/week: 3.0 standard drinks    Types: 3 Glasses of wine per week    Comment: wine with dinner   . Drug use: No  . Sexual activity: Never    Partners: Male  Other Topics Concern  . Not on file  Social History Narrative   Lives w/ husband    Caffeine use: Coffee daily   Right handed    Social Determinants of Health   Financial Resource Strain:   . Difficulty of Paying Living Expenses:   Food Insecurity:   . Worried About Charity fundraiser in the Last Year:   . Arboriculturist in the Last Year:   Transportation Needs:   . Film/video editor (Medical):   Marland Kitchen Lack of Transportation (Non-Medical):   Physical Activity: Unknown  . Days of Exercise per Week: Not on file  . Minutes of Exercise per Session: 30 min  Stress: No Stress Concern Present  . Feeling of Stress : Only a little  Social Connections:   . Frequency of Communication with Friends and Family:   . Frequency of Social Gatherings with Friends and Family:   . Attends Religious Services:   . Active Member of Clubs or Organizations:   . Attends Archivist Meetings:   Marland Kitchen Marital Status:      Family History: The patient's family history includes Breast cancer in her mother; Cancer in her mother; Stroke in her brother. There is no history of Colon cancer, Esophageal cancer, Rectal cancer, or Stomach cancer.  ROS:   Please see the history of present illness.     All other systems reviewed and are negative.  EKGs/Labs/Other Studies Reviewed:    The following studies were reviewed today:  Cath  04/17/2020 1.  Nonobstructive coronary artery disease with mild nonobstructive stenosis of the proximal to mid LAD and moderate nonobstructive stenosis of the mid left circumflex, confirmed to be nonobstructive by invasive RFR evaluation 2.  Normal LVEDP  Recommendations: Ongoing medical therapy.  While the patient does not have severe coronary stenoses, she does have diffuse plaquing in the proximal LAD and proximal to mid circumflex and is appropriate for aggressive risk reduction measures.  EKG:  EKG is not  ordered today.    Recent Labs: 08/03/2019: ALT 11 04/13/2020: BUN 15; Creatinine, Ser 0.98; Hemoglobin 14.3; Platelets 375; Potassium 5.1; Sodium 141  Recent Lipid Panel    Component Value Date/Time   CHOL 214 (H) 07/26/2019 0949   TRIG 103 07/26/2019 0949   HDL 74 07/26/2019 0949   CHOLHDL 2.9 07/26/2019 0949   CHOLHDL 4 06/05/2016 0843   VLDL 35.8 06/05/2016 0843   LDLCALC 119 (H) 07/26/2019 0949    Physical Exam:    VS:  BP (!) 98/56 (BP Location: Left Arm, Patient Position: Sitting, Cuff Size: Normal)   Pulse 81   Temp (!) 97 F (36.1 C)   Ht 5\' 4"  (1.626 m)   Wt 132 lb (59.9 kg)   BMI 22.66 kg/m     Wt Readings from Last 3 Encounters:  05/09/20 132 lb (59.9 kg)  04/17/20 130 lb (59 kg)  04/13/20 134 lb 3.2 oz (60.9 kg)     GEN:  Well nourished, well developed in no acute distress HEENT: Normal NECK: No JVD; No carotid bruits LYMPHATICS: No lymphadenopathy CARDIAC: RRR, no murmurs, rubs, gallops RESPIRATORY:  Clear to auscultation without rales, wheezing or rhonchi  ABDOMEN: Soft, non-tender, non-distended MUSCULOSKELETAL:  No edema; No deformity  SKIN: Warm and dry NEUROLOGIC:  Alert and oriented x 3 PSYCHIATRIC:  Normal affect   ASSESSMENT:    1. Coronary artery disease involving native coronary artery of native heart without angina pectoris   2. Hyperlipidemia LDL goal <70   3. Controlled type 2 diabetes mellitus without complication, without  long-term current use of insulin ([place])   4. Chronic obstructive pulmonary disease, unspecified COPD type ([place])    PLAN:    In order of problems listed above:  1. CAD: Recently underwent cardiac catheterization that showed moderate disease.  Continue aspirin  2. Hyperlipidemia: She is intolerant of statin and unable to afford Repatha.  I discussed the case with Dr. Debara Patrick who is nurse is looking into obtaining medication assistance for the patient   3. Prediabetes: Managed by primary care provider.  She is not on any medications this time  4. COPD: She has upcoming visit with pulmonology service.   Medication Adjustments/Labs and Tests Ordered: Current medicines are reviewed at length with the patient today.  Concerns regarding medicines are outlined above.  No orders of the defined types were placed in this encounter.  No orders of the defined types were placed in this encounter.   Patient Instructions  Medication Instructions:  Your physician recommends that you continue on your current medications as directed. Please refer to the Current Medication list given to you today.  *If you need a refill on your cardiac medications before your next appointment, please call your pharmacy*  Lab Work: NONE ordered at this time of appointment   If you have labs (blood work) drawn today and your tests are completely normal, you will receive your results only by: Marland Kitchen MyChart Message (if you have MyChart) OR . A paper copy in the mail If you have any lab test that is abnormal or we need to change your treatment, we will call you to review the results.  Testing/Procedures: NONE ordered at this time of appointment   Follow-Up: At [facility], you and your health needs are our priority.  As part of our continuing mission to provide you with exceptional heart care, we have created designated Provider Care Teams.  These Care Teams include your primary Cardiologist (physician) and Advanced  Practice  Providers (APPs -  Physician Assistants and Nurse Practitioners) who all work together to provide you with the care you need, when you need it.  Your next appointment:   3-4 month(s)  The format for your next appointment:   In Person  Provider:   Raliegh Ip Mali Hilty, MD  Other Instructions      Signed, Almyra Deforest, [place]  05/11/2020 11:19 PM    [place]

## 2020-05-23 ENCOUNTER — Institutional Professional Consult (permissible substitution): Payer: Medicare HMO | Admitting: Pulmonary Disease

## 2020-06-06 ENCOUNTER — Encounter: Payer: Self-pay | Admitting: Neurology

## 2020-06-06 ENCOUNTER — Ambulatory Visit: Payer: Medicare HMO | Admitting: Neurology

## 2020-06-06 ENCOUNTER — Other Ambulatory Visit: Payer: Self-pay

## 2020-06-06 VITALS — BP 125/67 | HR 74 | Ht 64.0 in | Wt 136.0 lb

## 2020-06-06 DIAGNOSIS — M79605 Pain in left leg: Secondary | ICD-10-CM | POA: Insufficient documentation

## 2020-06-06 DIAGNOSIS — M7918 Myalgia, other site: Secondary | ICD-10-CM | POA: Diagnosis not present

## 2020-06-06 DIAGNOSIS — R208 Other disturbances of skin sensation: Secondary | ICD-10-CM | POA: Insufficient documentation

## 2020-06-06 DIAGNOSIS — G501 Atypical facial pain: Secondary | ICD-10-CM

## 2020-06-06 DIAGNOSIS — M79604 Pain in right leg: Secondary | ICD-10-CM | POA: Diagnosis not present

## 2020-06-06 MED ORDER — METHOCARBAMOL 500 MG PO TABS: 500.0000 mg | ORAL_TABLET | Freq: Three times a day (TID) | ORAL | 5 refills | Status: DC

## 2020-06-06 NOTE — Progress Notes (Signed)
.   [facility]  PATIENT: Wendy Patrick DOB: 07/31/1946  REASON FOR VISIT: optic neuritis                                 HISTORICAL  CHIEF COMPLAINT:  Chief Complaint  Patient presents with  . Follow-up    RM 12 with spouse, George. Last seen 06/01/2019 by AL,NP. Here to f/u on LBP, facial pain. Takes gabapentin, meloxicam prn.     HISTORY OF PRESENT ILLNESS:   Update 01/13/2019: She reports right facial pain is intermittent and located near the ear and eye.   No position makes it worse but using her Ipad more sometimes increases pain near the eye.      Gabapentin did not help her.    In general pain is milder than it was in 2019.    CT coronary fraction showed LCx stenosis  Recently she had a cardiac catheter showing moderate LCx stenosis that was nonobstructive and mild LAD stenosis.   She is on ASA.    She has bilateral leg pain that is near constant with fluctuations, with more aching at night.  She moves her legs a lot.  Pain sometimes radiates down the left leg.   Pain worsened after she moved a mattress.    MRI of the brain 10/17/2018 showed mild white matter changes c/w small vessel ischemic change and no issue along the 5th nerve.  The CRP, ESR and lipid panel were fine.   Abd CT 2019 was reviewed and shows mild L4L5 and L5S1 DJD but no definite nerve root compression.   No spinal stenosis.     From 10/16/2018: Wendy Patrick is a 74-year-old woman who had an episode of left optic neuritis in November 2015 who has been experiencing right cheek pain since February 2016.  At the time she had dental issues and had a tooth extraction.     She had a dry socket and needed a couple procedures.    Pain is constant with some fluctuation and she also notes a scratchy sensation in her throat.   She feel the  Cheek and the inside of the mouth and throat are mildly numb.      She is also experiencing pain elsewhere in the arms, legs and eyes over the last year.     Pain is  worse with activity.    Rest helps slightly if she lays on her right side.  Ibuprofen helps the pain a a little bit.    Her legs feel a little weaker and she needs to rest more.   A few years ago she was much more active.      She has reduced vision on the left since an episode of optic neuritis.    She feels that eye is dry and scratchy.    She had optic neuritis on the left in 2016.   She felt vision got slightly better but then worsened again after cataract surgery.      She feels a little off balanced.      She is sleeping poorly.   She averages 6 hours sleep a night but wakes up several times most nights.       After her last visit I reviewed the MRI of the brain that showed multiple deep white matter, subcortical and periventricular foci in a pattern more consistent with chronic small vessel ischemic changes than to multiple   sclerosis.   Her spinal fluid was not truly consistent with MS. She had normal IgG index and normal oligoclonal bands were not present (the report states 5 bands appeared more prominent).    Since her last visit, she denies any new neurologic symptom lasting over a day.   She had another eye exam last month and was told everything looked good.     She had left eye pain x 1 hour but no change in vision a couplel months ago.     She feels her gait is mildly off and she veers to one side or the other.   THis has been going on about a year.     She often has ringing in her ears.     The vertigo is translational where she feels like she is being pushed a little bit to the one side (or the other). She notes this most when she is dancing and after a spin feels like she is being pushed one way. However, she has also  noted this while walking down a hallway.  These episodes are occurring less frequently than they used to. Occasionally she has tinnitus  REVIEW OF SYSTEMS:  Constitutional: No fevers, chills, sweats, or change in appetite.   Decreased energy is noted Eyes: No visual  changes, double vision, eye pain Ear, nose and throat: No hearing loss, ear pain, nasal congestion, sore throat.   She has a spinning sensation and tinnitus.   Cardiovascular: No chest pain, palpitations Respiratory:  No shortness of breath at rest or with exertion.   No wheezes GastrointestinaI: No nausea, vomiting, diarrhea, abdominal pain, fecal incontinence Genitourinary:  as above. Musculoskeletal:  No neck pain, back pain Integumentary: No rash, pruritus, skin lesions Neurological: as above Psychiatric: No depression at this time.  No anxiety Endocrine: No palpitations, diaphoresis, change in appetite, change in weigh or increased thirst Hematologic/Lymphatic:  No anemia, purpura, petechiae. Allergic/Immunologic: No itchy/runny eyes, nasal congestion, recent allergic reactions, rashes  ALLERGIES: Allergies  Allergen Reactions  . Statins     Leg cramps  . Crestor [Rosuvastatin] Other (See Comments)    Leg cramps  . Latex Rash    HOME MEDICATIONS: Outpatient Medications Prior to Visit  Medication Sig Dispense Refill  . aspirin EC 81 MG tablet Take 81 mg by mouth daily.    . cholecalciferol (VITAMIN D) 1000 units tablet Take 10,000 Units by mouth daily.     . KRILL OIL PO Take 250 mg by mouth daily.    . Loratadine 10 MG CAPS Take 10 mg by mouth daily.    . Multiple Vitamins-Minerals (MULTIVITAMIN WITH MINERALS) tablet Take 6 tablets by mouth daily. Balance of nature 3- veggies 3- fruit     No facility-administered medications prior to visit.    PAST MEDICAL HISTORY: Past Medical History:  Diagnosis Date  . COPD (chronic obstructive pulmonary disease) ([facility])   . Coronary artery disease 08/2009   40% mid left circ by cath in [place], [place]  . Endometriosis    1986  . Gestational diabetes   . Granuloma annulare   . Hyperlipidemia   . Optic neuritis    2015  . Optic neuritis Novembr  . Vaginal anomaly    tears and bleeds easily    PAST SURGICAL HISTORY: Past  Surgical History:  Procedure Laterality Date  . ABDOMINAL HYSTERECTOMY    . CARDIAC CATHETERIZATION    . COLONOSCOPY    . EYE SURGERY    . LEFT HEART CATH   AND CORONARY ANGIOGRAPHY N/A 04/17/2020   Procedure: LEFT HEART CATH AND CORONARY ANGIOGRAPHY;  Surgeon: Sherren Mocha, MD;  Location: [facility];  Service: Cardiovascular;  Laterality: N/A;  . MANDIBLE SURGERY    . MINOR HEMORRHOIDECTOMY    . TUBAL LIGATION    . UPPER GASTROINTESTINAL ENDOSCOPY      FAMILY HISTORY: Family History  Problem Relation Age of Onset  . Cancer Mother        Breast   . Breast cancer Mother   . Stroke Brother        open heart surgery   . Colon cancer Neg Hx   . Esophageal cancer Neg Hx   . Rectal cancer Neg Hx   . Stomach cancer Neg Hx     SOCIAL HISTORY:  Social History   Socioeconomic History  . Marital status: Married    Spouse name: Iona Beard  . Number of children: 1  . Years of education: GED  . Highest education level: Not on file  Occupational History  . Occupation: Retired  Tobacco Use  . Smoking status: Former Smoker    Packs/day: 2.00    Years: 50.00    Pack years: 100.00    Types: Cigarettes    Quit date: 02/05/2000    Years since quitting: 20.3  . Smokeless tobacco: Never Used  . Tobacco comment: Educated LDCT for lung Ca to discuss with MD if interested  Vaping Use  . Vaping Use: Never used  Substance and Sexual Activity  . Alcohol use: Yes    Alcohol/week: 3.0 standard drinks    Types: 3 Glasses of wine per week    Comment: wine with dinner   . Drug use: No  . Sexual activity: Never    Partners: Male  Other Topics Concern  . Not on file  Social History Narrative   Lives w/ husband    Caffeine use: Coffee daily   Right handed    Social Determinants of Health   Financial Resource Strain:   . Difficulty of Paying Living Expenses:   Food Insecurity:   . Worried About Charity fundraiser in the Last Year:   . Arboriculturist in the Last Year:     Transportation Needs:   . Film/video editor (Medical):   Marland Kitchen Lack of Transportation (Non-Medical):   Physical Activity: Unknown  . Days of Exercise per Week: Not on file  . Minutes of Exercise per Session: 30 min  Stress: No Stress Concern Present  . Feeling of Stress : Only a little  Social Connections:   . Frequency of Communication with Friends and Family:   . Frequency of Social Gatherings with Friends and Family:   . Attends Religious Services:   . Active Member of Clubs or Organizations:   . Attends Archivist Meetings:   Marland Kitchen Marital Status:   Intimate Partner Violence:   . Fear of Current or Ex-Partner:   . Emotionally Abused:   Marland Kitchen Physically Abused:   . Sexually Abused:      PHYSICAL EXAM  Vitals:   06/06/20 0757  BP: 125/67  Pulse: 74  Weight: 136 lb (61.7 kg)  Height: 5' 4" (1.626 m)    Body mass index is 23.34 kg/m.   General: The patient is well-developed and well-nourished and in no acute distress   Neck: The neck has good ROM.  The neck is nontender.   Mild left > Right trochanteric bursa tenderness.   Moderate bilateral  piriformis muscle tenderness.   No lumbar tenderness  Neurologic Exam  Mental status: The patient is alert and oriented x 3 at the time of the examination. The patient has apparent normal recent and remote memory, with an apparently normal attention span and concentration ability.   Speech is normal.  Cranial nerves: Extraocular movements are full.  Color vision is desaturated out of the left eye.  She reports reduced V2 temperature sensation in face.   Trapezius and sternocleidomastoid strength is normal. No dysarthria is noted.  No obvious hearing deficits are noted.  Motor:  Muscle bulk and tone are normal. Strength is  5 / 5 in all 4 extremities.   Sensory: Sensory testing is intact to touch in all 4 extremities.   Gait and station: Station and gait are normal. Tandem gait is normal for age (minimaly wide). Romberg is  negative.      Reflexes: Deep tendon reflexes are symmetric and normal bilaterally.      ASSESSMENT AND PLAN  Atypical facial pain  Piriformis muscle pain  Pain in both lower extremities  Dysesthesia   1.   Advised to do do piriformis stretch exercises.    Robaxin 500 mg po tid.  2.   Remain active and exercise as tolerated.   3.   If pain worsens, check lumbar MRI.   4.   Facial pain persists.   Did not tolerate gabapentin or duloxetine.   Consider lamotrigine if it worsens. 5.   Return as needed if there are new or worsening neurologic symptoms.   A. , MD, PhD 06/06/2020, 9:08 AM Certified in Neurology, Clinical Neurophysiology, Sleep Medicine, Pain Medicine and Neuroimaging  [facility] [address], [address] [place], [place] [address] [phone]   

## 2020-06-06 NOTE — Patient Instructions (Addendum)
Try to do "piriformis stretch exercises" 5-10 minutes daily on each side  You can find on youtube

## 2020-06-12 ENCOUNTER — Ambulatory Visit: Payer: Medicare HMO | Admitting: Critical Care Medicine

## 2020-06-12 ENCOUNTER — Other Ambulatory Visit: Payer: Self-pay

## 2020-06-12 ENCOUNTER — Encounter: Payer: Self-pay | Admitting: Critical Care Medicine

## 2020-06-12 VITALS — BP 122/70 | HR 91 | Temp 97.8°F | Ht 64.0 in | Wt 135.0 lb

## 2020-06-12 DIAGNOSIS — R0600 Dyspnea, unspecified: Secondary | ICD-10-CM | POA: Diagnosis not present

## 2020-06-12 DIAGNOSIS — R05 Cough: Secondary | ICD-10-CM

## 2020-06-12 DIAGNOSIS — J432 Centrilobular emphysema: Secondary | ICD-10-CM | POA: Diagnosis not present

## 2020-06-12 DIAGNOSIS — R918 Other nonspecific abnormal finding of lung field: Secondary | ICD-10-CM | POA: Diagnosis not present

## 2020-06-12 DIAGNOSIS — Z87891 Personal history of nicotine dependence: Secondary | ICD-10-CM | POA: Diagnosis not present

## 2020-06-12 DIAGNOSIS — R053 Chronic cough: Secondary | ICD-10-CM

## 2020-06-12 LAB — BASIC METABOLIC PANEL
BUN: 16 mg/dL (ref 6–23)
CO2: 30 mEq/L (ref 19–32)
Calcium: 9.7 mg/dL (ref 8.4–10.5)
Chloride: 102 mEq/L (ref 96–112)
Creatinine, Ser: 0.94 mg/dL (ref 0.40–1.20)
GFR: 57.93 mL/min — ABNORMAL LOW (ref >60.00)
Glucose, Bld: 103 mg/dL — ABNORMAL HIGH (ref 70–99)
Potassium: 4.3 mEq/L (ref 3.5–5.1)
Sodium: 139 mEq/L (ref 135–145)

## 2020-06-12 MED ORDER — AZELASTINE HCL 0.1 % NA SOLN: 2.0000 | Freq: Two times a day (BID) | NASAL | 12 refills | Status: DC

## 2020-06-12 MED ORDER — FLUTICASONE PROPIONATE 50 MCG/ACT NA SUSP
2.0000 | Freq: Every day | NASAL | 11 refills | Status: DC
Start: 2020-06-12 — End: 2021-07-08

## 2020-06-12 NOTE — Progress Notes (Signed)
Synopsis: Referred in July 2021 for SOB by Pixie Casino, MD.  Subjective:   PATIENT ID: Wendy Patrick GENDER: female DOB: May 17, 1946, MRN: [id number]  Chief Complaint  Patient presents with   Consult    Dry/productive cough with clear sputum. SOmetimes she gets a little clump of yellow then its stops. Shortness of breath that is worse with exertion.     Wendy Patrick is a 74 year old woman who presents for evaluation of abnormal chest CT. She is accompanied today by her husband.  She had a coronary CTA performed by cardiology which demonstrated emphysema and subcarinal lymphadenopathy.  She has chronic cough and shortness of breath.  Her cough is more significant than her shortness of breath.  She occasionally has yellow sputum, but not always.  Her shortness of breath is worse with exertion.  She sometimes has shortness of breath at rest, but this varies.  This is been ongoing and worsening over several years.  She sometimes wakes up at night with shortness of breath.  She coughs more at night and sounds like she is getting strangled per her husband.  She has rhinorrhea constantly, but denies shortness of breath.  Her husband thinks that she may be having this at night.  She has very infrequent reflux symptoms less than once per month.  She has an allergy to dust.  She takes loratadine daily and has tried nasal washes and steroid nasal spray in the past with temporary relief solution of her symptoms, but they returned.  She quit smoking in 1987 after 40 years x 2 packs/day.  She is not currently using inhalers.  There is no family history of lung disease.     Past Medical History:  Diagnosis Date   COPD (chronic obstructive pulmonary disease) ([facility])    Coronary artery disease 08/2009   40% mid left circ by cath in [place], [place]   Endometriosis    1986   Gestational diabetes    Granuloma annulare    Hyperlipidemia    Optic neuritis    2015   Optic neuritis Novembr   Vaginal  anomaly    tears and bleeds easily     Family History  Problem Relation Age of Onset   Cancer Mother        Breast    Breast cancer Mother    Stroke Brother        open heart surgery    Colon cancer Neg Hx    Esophageal cancer Neg Hx    Rectal cancer Neg Hx    Stomach cancer Neg Hx      Past Surgical History:  Procedure Laterality Date   ABDOMINAL HYSTERECTOMY     CARDIAC CATHETERIZATION     COLONOSCOPY     EYE SURGERY     LEFT HEART CATH AND CORONARY ANGIOGRAPHY N/A 04/17/2020   Procedure: LEFT HEART CATH AND CORONARY ANGIOGRAPHY;  Surgeon: Sherren Mocha, MD;  Location: [facility];  Service: Cardiovascular;  Laterality: N/A;   MANDIBLE SURGERY     MINOR HEMORRHOIDECTOMY     TUBAL LIGATION     UPPER GASTROINTESTINAL ENDOSCOPY      Social History   Socioeconomic History   Marital status: Married    Spouse name: Iona Beard   Number of children: 1   Years of education: GED   Highest education level: Not on file  Occupational History   Occupation: Retired  Tobacco Use   Smoking status: Former Smoker    Packs/day: 2.00  Years: 40.00    Pack years: 80.00    Types: Cigarettes    Quit date: 02/05/1996    Years since quitting: 24.3   Smokeless tobacco: Never Used   Tobacco comment: Educated LDCT for lung Ca to discuss with MD if interested  Vaping Use   Vaping Use: Never used  Substance and Sexual Activity   Alcohol use: Yes    Alcohol/week: 3.0 standard drinks    Types: 3 Glasses of wine per week    Comment: wine with dinner    Drug use: No   Sexual activity: Never    Partners: Male  Other Topics Concern   Not on file  Social History Narrative   Lives w/ husband    Caffeine use: Coffee daily   Right handed    Social Determinants of Health   Financial Resource Strain:    Difficulty of Paying Living Expenses:   Food Insecurity:    Worried About Charity fundraiser in the Last Year:    Arboriculturist in the Last  Year:   Transportation Needs:    Film/video editor (Medical):    Lack of Transportation (Non-Medical):   Physical Activity: Unknown   Days of Exercise per Week: Not on file   Minutes of Exercise per Session: 30 min  Stress: No Stress Concern Present   Feeling of Stress : Only a little  Social Connections:    Frequency of Communication with Friends and Family:    Frequency of Social Gatherings with Friends and Family:    Attends Religious Services:    Active Member of Clubs or Organizations:    Attends Archivist Meetings:    Marital Status:   Intimate Partner Violence:    Fear of Current or Ex-Partner:    Emotionally Abused:    Physically Abused:    Sexually Abused:      Allergies  Allergen Reactions   Statins     Leg cramps   Crestor [Rosuvastatin] Other (See Comments)    Leg cramps   Latex Rash     Immunization History  Administered Date(s) Administered   Pneumococcal Conjugate-13 06/05/2016   Pneumococcal Polysaccharide-23 12/01/2009, 08/02/2019, 08/22/2019   Tdap 12/01/2010   Zoster 12/02/2011    Outpatient Medications Prior to Visit  Medication Sig Dispense Refill   aspirin EC 81 MG tablet Take 81 mg by mouth daily.     cholecalciferol (VITAMIN D) 1000 units tablet Take 10,000 Units by mouth daily.      KRILL OIL PO Take 250 mg by mouth daily.     Loratadine 10 MG CAPS Take 10 mg by mouth daily.     methocarbamol (ROBAXIN) 500 MG tablet Take 1 tablet (500 mg total) by mouth 3 (three) times daily. 90 tablet 5   Multiple Vitamins-Minerals (MULTIVITAMIN WITH MINERALS) tablet Take 6 tablets by mouth daily. Balance of nature 3- veggies 3- fruit     No facility-administered medications prior to visit.    Review of Systems  Constitutional: Negative for chills, fever and weight loss.  HENT: Positive for congestion.        Rhinorrhea  Respiratory: Positive for cough, sputum production, shortness of breath and wheezing.  Negative for hemoptysis.   Gastrointestinal: Negative for heartburn and nausea.  Musculoskeletal: Positive for back pain and joint pain.       Chronic joint pains  Endo/Heme/Allergies: Positive for environmental allergies.     Objective:   Vitals:   06/12/20 1042  BP:  122/70  Pulse: 91  Temp: 97.8 F (36.6 C)  TempSrc: Oral  SpO2: 94%  Weight: 135 lb (61.2 kg)  Height: 5\' 4"  (1.626 m)   94% on  RA BMI Readings from Last 3 Encounters:  06/12/20 23.17 kg/m  06/06/20 23.34 kg/m  05/09/20 22.66 kg/m   Wt Readings from Last 3 Encounters:  06/12/20 135 lb (61.2 kg)  06/06/20 136 lb (61.7 kg)  05/09/20 132 lb (59.9 kg)    Physical Exam Vitals reviewed.  Constitutional:      General: She is not in acute distress.    Appearance: Normal appearance. She is not ill-appearing.  HENT:     Head: Normocephalic and atraumatic.     Mouth/Throat:     Comments: Occasional throat clearing throughout visit. Eyes:     General: No scleral icterus. Cardiovascular:     Rate and Rhythm: Normal rate and regular rhythm.     Heart sounds: No murmur heard.   Pulmonary:     Comments: Breathing comfortably on room air, no conversational dyspnea.  No observed coughing.  Clear to auscultation bilaterally. Abdominal:     General: There is no distension.     Palpations: Abdomen is soft.     Tenderness: There is no abdominal tenderness.  Musculoskeletal:        General: No swelling or deformity.     Cervical back: Neck supple.  Lymphadenopathy:     Cervical: No cervical adenopathy.  Skin:    General: Skin is warm and dry.     Findings: No rash.  Neurological:     General: No focal deficit present.     Mental Status: She is alert.     Coordination: Coordination normal.  Psychiatric:        Mood and Affect: Mood normal.        Behavior: Behavior normal.      CBC    Component Value Date/Time   WBC 6.5 04/13/2020 1047   WBC 6.6 02/01/2019 0830   RBC 5.31 (H) 04/13/2020 1047    RBC 5.30 (H) 02/01/2019 0830   HGB 14.3 04/13/2020 1047   HCT 44.5 04/13/2020 1047   PLT 375 04/13/2020 1047   MCV 84 04/13/2020 1047   MCH 26.9 04/13/2020 1047   MCH 28.3 10/10/2014 0834   MCHC 32.1 04/13/2020 1047   MCHC 32.8 02/01/2019 0830   RDW 12.7 04/13/2020 1047   LYMPHSABS 1.6 03/08/2018 0932   MONOABS 0.7 03/08/2018 0932   EOSABS 0.2 03/08/2018 0932   BASOSABS 0.0 03/08/2018 0932    CHEMISTRY No results for input(s): NA, K, CL, CO2, GLUCOSE, BUN, CREATININE, CALCIUM, MG, PHOS in the last 168 hours. CrCl cannot be calculated (Patient's most recent lab result is older than the maximum 21 days allowed.).   Chest Imaging- films reviewed: CT coronary 04/04/2020, lung images reviewed -  2.1 cm asymmetric subcarinal density more to the right.  Centrally calcified RML paramediastinal nodule.  Significant emphysema. CAD RADS 3 coronary disease  Pulmonary Functions Testing Results: No flowsheet data found.   Heart Catheterization 04/17/2020:     1.  Nonobstructive coronary artery disease with mild nonobstructive stenosis of the proximal to mid LAD and moderate nonobstructive stenosis of the mid left circumflex, confirmed to be nonobstructive by invasive RFR evaluation 2.  Normal LVEDP  Recommendations: Ongoing medical therapy.  While the patient does not have severe coronary stenoses, she does have diffuse plaquing in the proximal LAD and proximal to mid circumflex and is appropriate  for aggressive risk reduction measures.     Assessment & Plan:     ICD-10-CM   1. Centrilobular emphysema ([place])  J43.2 Pulmonary function test    CT Chest W Contrast  2. History of tobacco abuse  Z87.891 Pulmonary function test  3. Abnormal CT scan of lung  R91.8 Pulmonary function test    CT Chest W Contrast    Basic Metabolic Panel (BMET)  4. Chronic cough  R05   5. Dyspnea, unspecified type  R06.00      Abnormal chest CT with subcarinal mass versus lymphadenopathy -CT chest with  contrast for further evaluation  Emphysema, history of tobacco abuse -CT chest -PFTs  Chronic dyspnea, cough-concern for COPD and postnasal drip -PFTs -Continue loratadine once daily -Adding azelastine twice daily and Flonase once daily. -Walk in the office today to ensure no desaturations-- no desaturations  RTC in 2 months after PFTs.   Current Outpatient Medications:    aspirin EC 81 MG tablet, Take 81 mg by mouth daily., Disp: , Rfl:    cholecalciferol (VITAMIN D) 1000 units tablet, Take 10,000 Units by mouth daily. , Disp: , Rfl:    KRILL OIL PO, Take 250 mg by mouth daily., Disp: , Rfl:    Loratadine 10 MG CAPS, Take 10 mg by mouth daily., Disp: , Rfl:    methocarbamol (ROBAXIN) 500 MG tablet, Take 1 tablet (500 mg total) by mouth 3 (three) times daily., Disp: 90 tablet, Rfl: 5   Multiple Vitamins-Minerals (MULTIVITAMIN WITH MINERALS) tablet, Take 6 tablets by mouth daily. Balance of nature 3- veggies 3- fruit, Disp: , Rfl:    azelastine (ASTELIN) 0.1 % nasal spray, Place 2 sprays into both nostrils 2 (two) times daily. Use in each nostril as directed, Disp: 30 mL, Rfl: 12   fluticasone (FLONASE) 50 MCG/ACT nasal spray, Place 2 sprays into both nostrils daily., Disp: 16 g, Rfl: [place] Shalice Woodring, DO [facility] 06/12/2020 11:19 AM

## 2020-06-12 NOTE — Patient Instructions (Addendum)
Thank you for visiting Dr. Carlis Abbott at [facility]. We recommend the following: Orders Placed This Encounter  Procedures   CT Chest W Contrast   Basic Metabolic Panel (BMET)   Pulmonary function test   Orders Placed This Encounter  Procedures   CT Chest W Contrast    First avialable    Standing Status:   Future    Standing Expiration Date:   06/12/2021    Order Specific Question:   ** REASON FOR EXAM (FREE TEXT)    Answer:   subcarinal mass    Order Specific Question:   If indicated for the ordered procedure, I authorize the administration of contrast media per Radiology protocol    Answer:   Yes    Order Specific Question:   Preferred imaging location?    Answer:   [facility]    Order Specific Question:   Radiology Contrast Protocol - do NOT remove file path    Answer:   [email or web address]   Basic Metabolic Panel (BMET)    Standing Status:   Future    Standing Expiration Date:   06/12/2021   Pulmonary function test    Standing Status:   Future    Standing Expiration Date:   06/12/2021    Order Specific Question:   Where should this test be performed?    Answer:   [facility]    Order Specific Question:   Full PFT: includes the following: basic spirometry, spirometry pre & post bronchodilator, diffusion capacity (DLCO), lung volumes    Answer:   Full PFT    Meds ordered this encounter  Medications   fluticasone (FLONASE) 50 MCG/ACT nasal spray    Sig: Place 2 sprays into both nostrils daily.    Dispense:  16 g    Refill:  11   azelastine (ASTELIN) 0.1 % nasal spray    Sig: Place 2 sprays into both nostrils 2 (two) times daily. Use in each nostril as directed    Dispense:  30 mL    Refill:  12    Return in about 2 months (around 08/13/2020).  After PFTs.   Please do your part to reduce the spread of COVID-19.

## 2020-06-13 NOTE — Progress Notes (Signed)
Please let Wendy Patrick know that her labs are fine. Her kidney function should be sufficient to get the necessary CT scan. Her BG is minimally elevated but was not a fasting lab, so I wouldn't worry. Thanks!

## 2020-06-19 ENCOUNTER — Other Ambulatory Visit: Payer: Self-pay

## 2020-06-19 ENCOUNTER — Ambulatory Visit (INDEPENDENT_AMBULATORY_CARE_PROVIDER_SITE_OTHER)
Admission: RE | Admit: 2020-06-19 | Discharge: 2020-06-19 | Disposition: A | Discharge status: Home / Self Care | Payer: Medicare HMO | Source: Ambulatory Visit | Attending: Critical Care Medicine | Admitting: Critical Care Medicine

## 2020-06-19 DIAGNOSIS — K449 Diaphragmatic hernia without obstruction or gangrene: Secondary | ICD-10-CM | POA: Diagnosis not present

## 2020-06-19 DIAGNOSIS — J432 Centrilobular emphysema: Secondary | ICD-10-CM

## 2020-06-19 DIAGNOSIS — R918 Other nonspecific abnormal finding of lung field: Secondary | ICD-10-CM

## 2020-06-19 DIAGNOSIS — I700 Atherosclerosis of aorta: Secondary | ICD-10-CM | POA: Diagnosis not present

## 2020-06-19 DIAGNOSIS — I2510 Atherosclerotic heart disease of native coronary artery without angina pectoris: Secondary | ICD-10-CM | POA: Diagnosis not present

## 2020-06-19 MED ORDER — IOHEXOL 300 MG/ML  SOLN
80.0000 mL | Freq: Once | INTRAMUSCULAR | Status: AC | PRN
  Administered 2020-06-19: 80 mL via INTRAVENOUS

## 2020-06-20 ENCOUNTER — Telehealth: Payer: Self-pay

## 2020-06-20 DIAGNOSIS — R590 Localized enlarged lymph nodes: Secondary | ICD-10-CM

## 2020-06-20 NOTE — Telephone Encounter (Signed)
COMPARISON:  04/04/2020 coronary CT.  FINDINGS: Cardiovascular: Normal heart size. No significant pericardial effusion/thickening. Left anterior descending and left circumflex coronary atherosclerosis. Atherosclerotic nonaneurysmal thoracic aorta. Normal caliber pulmonary arteries. No central pulmonary emboli.  Mediastinum/Nodes: Subcentimeter hypodense anterior right thyroid nodule. Not clinically significant; no follow-up imaging recommended (ref: J Am Coll Radiol. 2015 Feb;12(2): 143-50). Unremarkable esophagus. No axillary adenopathy. Subcarinal 2.8 x 2.1 cm soft tissue density mass (series 2/image 70), stable since 04/04/2020 coronary CT. Mild AP window adenopathy up to the 1.0 cm (series 2/image 59). No hilar adenopathy.  Lungs/Pleura: No pneumothorax. No pleural effusion. Marked centrilobular emphysema with diffuse bronchial wall thickening. Normal variant azygos fissure in the upper right lung. No acute consolidative airspace disease, lung masses or significant pulmonary nodules. Stable calcified medial right middle lobe 1 cm granuloma.  Upper abdomen: Small hiatal hernia. Subcentimeter hypodense right liver dome lesion is too small to characterize and unchanged, probably benign. Left adrenal 2.1 cm nodule with density 84 HU and right adrenal 1.0 cm nodule with density 84 HU, characterized as benign adenomas on 05/09/2018 MRI abdomen. [place] with these results.  Musculoskeletal: No aggressive appearing focal osseous lesions. Mild thoracic spondylosis.  IMPRESSION: 1. Subcarinal 2.8 x 2.1 cm soft tissue density mass is stable since 04/04/2020 coronary CT, with differential including pathologic subcarinal lymph node versus dense bronchopulmonary or esophageal duplication cyst. Mild AP window lymphadenopathy. Recommend further characterization with PET-CT at this time given the advanced smoking related changes in the lungs. 2. Marked  centrilobular emphysema with diffuse bronchial wall thickening, suggesting COPD. No lung masses or significant pulmonary nodules. 3. Two-vessel coronary atherosclerosis. 4. Small hiatal hernia. 5. Bilateral adrenal adenomas. 6. Aortic Atherosclerosis (ICD10-I70.0) and Emphysema (ICD10-J43.9).  These results will be called to the ordering clinician or representative by the Radiologist Assistant, and communication documented in the PACS or [employer]. Dr. Carlis Abbott gave verbal okay to order PET scan and to get it done asap.  Patient is now aware of this and scan has been order. Nothing further needed.

## 2020-06-20 NOTE — Telephone Encounter (Signed)
I spoke to Wendy Patrick this evening to discuss her CT results. No lung masses, but she has a persistently enlarged subcarinal lymph node. She understands the need for PET scan and eventually a biopsy. The PET scan may reveal other locations needing biopsy, but if the PET scan is negative the subcarinal LN should be biopsied regardless at that size.  Julian Hy, DO 06/20/20 5:59 PM [facility]

## 2020-06-25 ENCOUNTER — Telehealth: Payer: Self-pay | Admitting: Pulmonary Disease

## 2020-06-25 NOTE — Telephone Encounter (Signed)
Scheduled for 7/29 @ 3, check in by 2:30, water only 6 hours prior-no food/gum/candy/carbs.  Pt verbalized understanding & stated nothing further needed at this time.

## 2020-06-25 NOTE — Telephone Encounter (Signed)
06/25/2020  PCC's,  Can we please ensure the patient gets scheduled for her PET scan.  I see this was ordered on 06/20/2020.  Can we please contact the patient to get this scheduled?  Wyn Quaker, FNP

## 2020-06-25 NOTE — Telephone Encounter (Signed)
Just received authorization for this.  On hold getting this scheduled right now.

## 2020-06-28 ENCOUNTER — Other Ambulatory Visit: Payer: Self-pay

## 2020-06-28 ENCOUNTER — Ambulatory Visit (HOSPITAL_COMMUNITY)
Admission: RE | Admit: 2020-06-28 | Discharge: 2020-06-28 | Disposition: A | Discharge status: Home / Self Care | Payer: Medicare HMO | Source: Ambulatory Visit | Attending: Critical Care Medicine | Admitting: Critical Care Medicine

## 2020-06-28 DIAGNOSIS — D3502 Benign neoplasm of left adrenal gland: Secondary | ICD-10-CM | POA: Insufficient documentation

## 2020-06-28 DIAGNOSIS — I2510 Atherosclerotic heart disease of native coronary artery without angina pectoris: Secondary | ICD-10-CM | POA: Insufficient documentation

## 2020-06-28 DIAGNOSIS — R590 Localized enlarged lymph nodes: Secondary | ICD-10-CM | POA: Diagnosis not present

## 2020-06-28 DIAGNOSIS — I700 Atherosclerosis of aorta: Secondary | ICD-10-CM | POA: Diagnosis not present

## 2020-06-28 DIAGNOSIS — J439 Emphysema, unspecified: Secondary | ICD-10-CM | POA: Insufficient documentation

## 2020-06-28 DIAGNOSIS — J432 Centrilobular emphysema: Secondary | ICD-10-CM | POA: Diagnosis not present

## 2020-06-28 MED ORDER — FLUDEOXYGLUCOSE F - 18 (FDG) INJECTION
6.4000 | Freq: Once | INTRAVENOUS | Status: AC
  Administered 2020-06-28: 6.4 via INTRAVENOUS

## 2020-06-29 ENCOUNTER — Telehealth: Payer: Self-pay | Admitting: Internal Medicine

## 2020-06-29 ENCOUNTER — Telehealth: Payer: Self-pay | Admitting: Pulmonary Disease

## 2020-06-29 DIAGNOSIS — J9859 Other diseases of mediastinum, not elsewhere classified: Secondary | ICD-10-CM

## 2020-06-29 DIAGNOSIS — Z5181 Encounter for therapeutic drug level monitoring: Secondary | ICD-10-CM

## 2020-06-29 LAB — GLUCOSE, CAPILLARY: Glucose-Capillary: 101 mg/dL — ABNORMAL HIGH (ref 70–99)

## 2020-06-29 NOTE — Telephone Encounter (Signed)
Noted thank you! Wendy Patrick

## 2020-06-29 NOTE — Telephone Encounter (Signed)
Pt states that she does want to proceed. However, she would like to wait until she is finished with all of the lung testing. She stated that she is waiting for them to call her with the results from yesterdays test and mentioned they wanted to check for a few more things. Pt stated that she will schedule an OV to touch base with PCP and discuss colonoscopy when ready.

## 2020-06-29 NOTE — Telephone Encounter (Signed)
Called and spoke with pt letting her know the results of the PET and stated to her that she will be receiving a call from GI based on the results. Pt verbalized understanding. Will leave encounter open for GI to further document in.

## 2020-06-29 NOTE — Telephone Encounter (Signed)
Thanks Leory Plowman. Agree, its reasonable to proceed with EGD for evaluation. If it is a esophageal duplication cyst, don't recommend sampling it. She is due for surveillance colonoscopy with h/o adenomatous colon polyps removed in 2017  Beth, please schedule patient for next available appointment for EGD and colonoscopy at [facility] with RN pre visit. Thanks VN

## 2020-06-29 NOTE — Telephone Encounter (Signed)
Triage,  Of note please make sure that patient is following up with primary care or gastroenterology.  May need to consider colonoscopy to further evaluate the hypermetabolic activity seen in the transverse colon.  Wyn Quaker, FNP

## 2020-06-29 NOTE — Telephone Encounter (Signed)
Can you call patient and see if she is willing to have colonoscopy which is recommended for her by the recent scan she had with the lung doctor?

## 2020-06-29 NOTE — Telephone Encounter (Signed)
Attempted to call pt but received a busy signal. Tried to call back but again received a busy signal. Will try to call back later. 

## 2020-06-29 NOTE — Telephone Encounter (Signed)
PCCM:  She is a patient of Dr. Silverio Decamp.   If this is a GI duplication cyst we cannot stick a needle in that. It will get infected.   Also, its not pet avid so I would not worry about sampling it at this time.   Maybe the next best step would be an EGD/Colon which can evaluate the possibility of a duplication cyst as well as the transverse colon PET uptake as she has had polyps before.   Thanks  BLI  Garner Nash, DO [facility] 06/29/2020 11:11 AM

## 2020-06-29 NOTE — Telephone Encounter (Signed)
06/29/2020  Please let the patient know that her PET scan results are as follows:  IMPRESSION: 1. No hypermetabolism to correspond to the subcarinal mass, which may have decreased slightly in size. Differential considerations include reactive node or complex bronchogenic cyst or GI duplication cyst. As malignant etiologies are not entirely excluded, recommend chest CT follow-up (ideally with contrast) at 3-6 months. 2. Left adrenal adenoma. 3. Focal hypermetabolism within the transverse colon for which polyp or mass cannot be excluded. Correlate with colon cancer screening history and consider colonoscopy if not up-to-date. 4. Incidental findings, including: Aortic atherosclerosis (ICD10-I70.0), coronary artery atherosclerosis and emphysema (ICD10-J43.9).  PET scan did not show any hypermetabolic activity with the subcarinal mass that we have been following. I had previously discussed her case with Dr. Carlis Abbott as Dr. Carlis Abbott is out on vacation. Wanted to notify her of the results. We still believe the patient should be set up for a EBUS/bronchoscopy. This is so we can obtain an actual tissue sample and review this in the lab. Dr. Carlis Abbott could potentially do this sometime next week. Our office should be calling to get this coordinated with her.   I will send this message to triage to follow-up with the patient. I will also send this message to the [facility] procedure pool to work on coordinating the procedure with Dr. Carlis Abbott.  See the information below:  EBUS   Diagnosis: mediastinal mass  Procedure: EBUS  Anesthesia: yes  Do you need Fluoro? no  Priority: ? yes  Date: ?   Soonest I can is August 8/9- 8/13- in the afternoons ; if we get results back and brad or Linna Hoff can do it sooner, then I am fine with asking them as well. Whatever is best for the patient.  Alternate Date: see above  Time: PM   Location: [facility]  Does patient have OSA? No DM?  No  Or Latex allergy YES  Medication  Restriction: No  Anticoagulate/Antiplatelet: fine to con't daily aspirin  Pre-op Labs Ordered: CBC, CMP, PT/INR, PTT  Imaging request: n/a   Please coordinate Pre-op COVID Testing    Please note that if you're unable to get the procedure scheduled within the time frame of Dr. Ainsley Spinner availability. She is okay with Dr. Valeta Harms or Dr. Tamala Julian proceeding forward in doing the procedure. Will route message to them as well as FYI.  Wyn Quaker, FNP

## 2020-07-02 ENCOUNTER — Encounter: Payer: Self-pay | Admitting: Gastroenterology

## 2020-07-02 NOTE — Telephone Encounter (Signed)
Done

## 2020-07-02 NOTE — Telephone Encounter (Signed)
Can someone help me get this patient scheduled please? Thank you in advance.

## 2020-07-07 ENCOUNTER — Other Ambulatory Visit (HOSPITAL_COMMUNITY): Payer: Medicare HMO

## 2020-07-10 ENCOUNTER — Ambulatory Visit (HOSPITAL_COMMUNITY): Admit: 2020-07-10 | Payer: Medicare HMO | Admitting: Critical Care Medicine

## 2020-07-10 ENCOUNTER — Encounter (HOSPITAL_COMMUNITY): Payer: Self-pay

## 2020-07-10 SURGERY — BRONCHOSCOPY, WITH EBUS
Anesthesia: General

## 2020-08-14 ENCOUNTER — Other Ambulatory Visit (HOSPITAL_COMMUNITY)
Admission: RE | Admit: 2020-08-14 | Discharge: 2020-08-14 | Disposition: A | Discharge status: Home / Self Care | Payer: Medicare HMO | Source: Ambulatory Visit | Attending: Critical Care Medicine | Admitting: Critical Care Medicine

## 2020-08-14 DIAGNOSIS — Z20822 Contact with and (suspected) exposure to covid-19: Secondary | ICD-10-CM | POA: Insufficient documentation

## 2020-08-14 DIAGNOSIS — Z01812 Encounter for preprocedural laboratory examination: Secondary | ICD-10-CM | POA: Diagnosis not present

## 2020-08-14 LAB — SARS CORONAVIRUS 2 (TAT 6-24 HRS): SARS Coronavirus 2: NEGATIVE

## 2020-08-16 ENCOUNTER — Encounter: Payer: Self-pay | Admitting: Internal Medicine

## 2020-08-16 ENCOUNTER — Ambulatory Visit: Payer: Medicare HMO | Admitting: Internal Medicine

## 2020-08-16 ENCOUNTER — Other Ambulatory Visit: Payer: Self-pay

## 2020-08-16 VITALS — BP 128/68 | HR 86 | Ht 64.0 in | Wt 136.0 lb

## 2020-08-16 DIAGNOSIS — I2510 Atherosclerotic heart disease of native coronary artery without angina pectoris: Secondary | ICD-10-CM | POA: Diagnosis not present

## 2020-08-16 DIAGNOSIS — M791 Myalgia, unspecified site: Secondary | ICD-10-CM | POA: Diagnosis not present

## 2020-08-16 DIAGNOSIS — T466X5A Adverse effect of antihyperlipidemic and antiarteriosclerotic drugs, initial encounter: Secondary | ICD-10-CM

## 2020-08-16 DIAGNOSIS — R0602 Shortness of breath: Secondary | ICD-10-CM

## 2020-08-16 DIAGNOSIS — E785 Hyperlipidemia, unspecified: Secondary | ICD-10-CM

## 2020-08-16 NOTE — Patient Instructions (Signed)
Medication Instructions:  Your physician recommends that you continue on your current medications as directed. Please refer to the Current Medication list given to you today.  *If you need a refill on your cardiac medications before your next appointment, please call your pharmacy*  Lab Work: Your physician recommends that you return for a FASTING lipid profile: 08/17/2020. DO NOT EAT OR DRINK PAST MIDNIGHT. OKAY TO HAVE WATER  If you have labs (blood work) drawn today and your tests are completely normal, you will receive your results only by: Marland Kitchen MyChart Message (if you have MyChart) OR . A paper copy in the mail If you have any lab test that is abnormal or we need to change your treatment, we will call you to review the results.  Testing/Procedures: NONE ordered at this time of appointment   Follow-Up: At [facility], you and your health needs are our priority.  As part of our continuing mission to provide you with exceptional heart care, we have created designated Provider Care Teams.  These Care Teams include your primary Cardiologist (physician) and Advanced Practice Providers (APPs -  Physician Assistants and Nurse Practitioners) who all work together to provide you with the care you need, when you need it.  Your next appointment:   1 year(s)  The format for your next appointment:   In Person  Provider:   K. Mali Hilty, MD  Other Instructions

## 2020-08-16 NOTE — Progress Notes (Signed)
OFFICE NOTE  Chief Complaint:  Shortness of breath, cough  Primary Care Physician: Hoyt Koch, MD  HPI:  Wendy Patrick is a 74 y.o. female with a past medial history significant for mild coronary artery disease with a 40% circumflex stenosis in 2010, COPD, type 2 diabetes, hyperlipidemia and statin intolerance.  She was previously followed by Dr. Radford Pax however requested to switch to my care since her husband is a patient of mine.  We will try to see them simultaneously.  Recently she has been having some issues with diarrhea.  She is been undergoing treatment with antibiotics and care by Dr. Watt Climes.  She feels drained.  She is also congested and having difficulty with allergies and chronic cough.  She reports labile blood pressures from the upper 90s to 140s, but has no history of hypertension and is not on medication.  In fact she takes only aspirin.  She has been previously seen by one of our PharmD's in the lipid clinic to discuss her dyslipidemia and was offered ezetimibe, but did not take the medication due to cost issues.  Currently she reports some discomfort in the left shoulder arm and left back.  This is similar but much less significant to the discomfort she had in 2010.  She says she gets it occasionally.  Is not necessarily worse with exertion or relieved by rest and does not sound particularly anginal to me.  She did have a nuclear stress test last year which was completely normal.  11/10/2018  Wendy Patrick returns today for routine follow-up.  She does have a history of coronary artery disease with some circumflex stenosis of 40% in 2010, type 2 diabetes, hyperlipidemia and statin intolerance with a goal LDL less than 70.  Her main issues have been with some degree of neuropathy for which she is on gabapentin.  She says she is not tolerating this well and feels tired and is having some mood changes related to that.  Unfortunately she has been statin intolerant.  She has been on  both atorvastatin and rosuvastatin which caused leg cramps.  We had discussed PCSK9 inhibitor therapy in the past however had not pursued it.  Her most recent lab work from June 2018 showed total cholesterol 270, HDL 60, LDL 178 and triglycerides 158.  Although her diet sound somewhat atherogenic, there may be a component of FH.   07/26/2019  Wendy Patrick is seen today in follow-up.  She is also been using Repatha.  Overall she seems to be tolerating however she has been having some concerns with runny nose and persistent cough.  This is atypical for the medication and she was concerned though it may be a side effect.  She needs a recheck of her lipids.  She was on gabapentin for neuropathy but this is been discontinued and has been seeing neurologist.  There was concern that she may have some nerve damage from an injection in her teeth that may be related to her cough and some possible difficulty swallowing.  02/22/2020  Wendy Patrick is seen today in follow-up with her husband. Unfortunately she stopped using Repatha. She had had a good response in her LDL cholesterol which went from 171 down to 119. I suspect that she could have FH as there is a strong family history of heart disease in her parents. She has had no cardiac symptoms that I was aware of however recently did have some left-sided chest discomfort. It apparently was pretty persistent and lasted  for couple hours. It was not associated with exertion or relieved by rest however did radiate into her left arm. According to her husband she had had a cardiovascular work-up by Dr. Fletcher Anon in the past which showed mild nonobstructive coronary disease with up to about a 40% stenosis. This was in 2010.  04/13/2020  Wendy Patrick is seen today in follow-up.  She underwent CT with FFR for ongoing chest pain on Apr 04, 2020.  I personally reviewed the study and demonstrated at least moderate mixed coronary disease in the circumflex with mild to moderate mixed coronary  disease of the LAD and RCA.  Coronary calcium score was 252, 76 percentile for age and sex matched control.  CT FFR was performed which demonstrated a significant flow-limiting mid to distal circumflex stenosis.  The FFR dropped suddenly from 0.97-0.62 suggesting a focal stenosis.  She had previously had a 40% stenosis in 2010 in this area and I suspect that has progressed.  Unfortunately she has been statin intolerant.  She also had been on Repatha but stopped it due to side effects.  In addition the over read of the CT suggested emphysema as well as subcarinal adenopathy.  She had previously seen Dr. Melvyn Novas with pulmonary who performed some chest x-rays and had been treating her for cough which did not improve.  She would like to follow up with a different pulmonologist.  I will refer her.  08/16/2020  Wendy Patrick is seen today in follow-up.  Based on her abnormal CT FFR she underwent definitive left heart catheterization in May 2021.  This demonstrated nonobstructive coronary disease with mild nonobstructive disease of the proximal to mid LAD and mid left circumflex.  LVEDP was normal.  Maximal medical therapy was recommended.  Unfortunate she cannot tolerate Repatha or statins.  LDL remained above target, last assessed in August 2020 at 119.  She is following a Mediterranean diet.  She will need repeat lipids.  She denies any chest pain.  She is working with our pulmonary and was noted to have some subcarinal lymphadenopathy.  She is scheduled to have pulmonary function testing tomorrow and a follow-up with Dr. Carlis Abbott.  PMHx:  Past Medical History:  Diagnosis Date   COPD (chronic obstructive pulmonary disease) ([place])    Coronary artery disease 08/2009   40% mid left circ by cath in [place], [place]   Endometriosis    1986   Gestational diabetes    Granuloma annulare    Hyperlipidemia    Optic neuritis    2015   Optic neuritis Novembr   Vaginal anomaly    tears and bleeds easily    Past  Surgical History:  Procedure Laterality Date   ABDOMINAL HYSTERECTOMY     CARDIAC CATHETERIZATION     COLONOSCOPY     EYE SURGERY     LEFT HEART CATH AND CORONARY ANGIOGRAPHY N/A 04/17/2020   Procedure: LEFT HEART CATH AND CORONARY ANGIOGRAPHY;  Surgeon: Sherren Mocha, MD;  Location: [facility];  Service: Cardiovascular;  Laterality: N/A;   MANDIBLE SURGERY     MINOR HEMORRHOIDECTOMY     TUBAL LIGATION     UPPER GASTROINTESTINAL ENDOSCOPY      FAMHx:  Family History  Problem Relation Age of Onset   Cancer Mother        Breast    Breast cancer Mother    Stroke Brother        open heart surgery    Colon cancer Neg Hx  Esophageal cancer Neg Hx    Rectal cancer Neg Hx    Stomach cancer Neg Hx     SOCHx:   reports that she quit smoking about 24 years ago. Her smoking use included cigarettes. She has a 80.00 pack-year smoking history. She has never used smokeless tobacco. She reports current alcohol use of about 3.0 standard drinks of alcohol per week. She reports that she does not use drugs.  ALLERGIES:  Allergies  Allergen Reactions   Statins     Leg cramps   Crestor [Rosuvastatin] Other (See Comments)    Leg cramps   Latex Rash    ROS: Pertinent items noted in HPI and remainder of comprehensive ROS otherwise negative.  HOME MEDS: Current Outpatient Medications on File Prior to Visit  Medication Sig Dispense Refill   aspirin EC 81 MG tablet Take 81 mg by mouth daily.     azelastine (ASTELIN) 0.1 % nasal spray Place 2 sprays into both nostrils 2 (two) times daily. Use in each nostril as directed 30 mL 12   cholecalciferol (VITAMIN D) 1000 units tablet Take 10,000 Units by mouth daily.      fluticasone (FLONASE) 50 MCG/ACT nasal spray Place 2 sprays into both nostrils daily. 16 g 11   KRILL OIL PO Take 250 mg by mouth daily.     Loratadine 10 MG CAPS Take 10 mg by mouth daily.     methocarbamol (ROBAXIN) 500 MG tablet Take 1 tablet  (500 mg total) by mouth 3 (three) times daily. 90 tablet 5   No current facility-administered medications on file prior to visit.    LABS/IMAGING: Results for orders placed or performed during the hospital encounter of 08/14/20 (from the past 48 hour(s))  SARS CORONAVIRUS 2 (TAT 6-24 HRS) Nasopharyngeal Nasopharyngeal Swab     Status: None   Collection Time: 08/14/20  9:02 AM   Specimen: Nasopharyngeal Swab  Result Value Ref Range   SARS Coronavirus 2 NEGATIVE NEGATIVE    Comment: (NOTE) SARS-CoV-2 target nucleic acids are NOT DETECTED.  The SARS-CoV-2 RNA is generally detectable in upper and lower respiratory specimens during the acute phase of infection. Negative results do not preclude SARS-CoV-2 infection, do not rule out co-infections with other pathogens, and should not be used as the sole basis for treatment or other patient management decisions. Negative results must be combined with clinical observations, patient history, and epidemiological information. The expected result is Negative.  Fact Sheet for Patients: SugarRoll.be  Fact Sheet for Healthcare Providers: https://www.woods-mathews.com/  This test is not yet approved or cleared by the [place] FDA and  has been authorized for detection and/or diagnosis of SARS-CoV-2 by FDA under an Emergency Use Authorization (EUA). This EUA will remain  in effect (meaning this test can be used) for the duration of the COVID-19 declaration under Se ction 564(b)(1) of the Act, 21 U.S.C. section 360bbb-3(b)(1), unless the authorization is terminated or revoked sooner.  Performed at [facility], [place] [address], [place], [place] [address]    No results found.  LIPID PANEL:    Component Value Date/Time   CHOL 214 (H) 07/26/2019 0949   TRIG 103 07/26/2019 0949   HDL 74 07/26/2019 0949   CHOLHDL 2.9 07/26/2019 0949   CHOLHDL 4 06/05/2016 0843   VLDL 35.8 06/05/2016 0843    LDLCALC 119 (H) 07/26/2019 0949     WEIGHTS: Wt Readings from Last 3 Encounters:  08/16/20 136 lb (61.7 kg)  06/12/20 135 lb (61.2 kg)  06/06/20 136 lb (61.7 kg)    VITALS: BP 128/68    Pulse 86    Ht 5\' 4"  (1.626 m)    Wt 136 lb (61.7 kg)    SpO2 100%    BMI 23.34 kg/m   EXAM: General appearance: alert and no distress Neck: no carotid bruit, no JVD and thyroid not enlarged, symmetric, no tenderness/mass/nodules Lungs: diminished breath sounds bibasilar Heart: regular rate and rhythm Abdomen: soft, non-tender; bowel sounds normal; no masses,  no organomegaly Extremities: extremities normal, atraumatic, no cyanosis or edema Pulses: 2+ and symmetric Skin: Skin color, texture, turgor normal. No rashes or lesions Neurologic: Grossly normal Psych: Pleasant  EKG: Normal sinus rhythm at 86-personally reviewed  ASSESSMENT: 1. Chest pain -abnormal CT FFR suggesting flow-limiting stenosis of the mid to distal circumflex, however cardiac catheterization in 03/2020 demonstrated mild nonobstructive disease of the mid circumflex and proximal LAD. 2. Mild nonobstructive coronary disease by cath (2010) -40% mid circumflex lesion 3. Dyslipidemia-statin intolerant 4. Emphysema 5. Subcarinal lymphadenopathy  PLAN: 1.   Wendy Patrick seems to have stable coronaries as evidenced by no significant changes by cath in 2021 compared to her prior cath in 2010.  Unfortunately she is statin intolerant and has persistent dyslipidemia above target.  She might be candidate for ezetimibe or possibly Nexletol.  We will need to repeat her blood work and she will get fasting labs tomorrow.  She also has PFTs and follow-up with her pulmonologist about persistent cough and dyspnea which hopefully will be able to be treated.  Plan follow-up with me annually or sooner as necessary.  Pixie Casino, MD, [facility], [place] Director of the Advanced Lipid Disorders &  Cardiovascular Risk  Reduction Clinic Diplomate of the American Board of Clinical Lipidology Attending Cardiologist  Direct Dial: [phone]   Fax: [phone]  Website:  [email or web address] 08/16/2020, 8:16 AM

## 2020-08-17 ENCOUNTER — Ambulatory Visit (INDEPENDENT_AMBULATORY_CARE_PROVIDER_SITE_OTHER): Payer: Medicare HMO | Admitting: Critical Care Medicine

## 2020-08-17 ENCOUNTER — Encounter: Payer: Self-pay | Admitting: Critical Care Medicine

## 2020-08-17 ENCOUNTER — Ambulatory Visit: Payer: Medicare HMO | Admitting: Critical Care Medicine

## 2020-08-17 ENCOUNTER — Other Ambulatory Visit: Payer: Self-pay

## 2020-08-17 VITALS — BP 120/58 | HR 65 | Temp 97.1°F | Ht 63.0 in | Wt 134.6 lb

## 2020-08-17 DIAGNOSIS — J432 Centrilobular emphysema: Secondary | ICD-10-CM

## 2020-08-17 DIAGNOSIS — R918 Other nonspecific abnormal finding of lung field: Secondary | ICD-10-CM

## 2020-08-17 DIAGNOSIS — Z87891 Personal history of nicotine dependence: Secondary | ICD-10-CM

## 2020-08-17 DIAGNOSIS — J449 Chronic obstructive pulmonary disease, unspecified: Secondary | ICD-10-CM

## 2020-08-17 DIAGNOSIS — E785 Hyperlipidemia, unspecified: Secondary | ICD-10-CM | POA: Diagnosis not present

## 2020-08-17 LAB — PULMONARY FUNCTION TEST
DL/VA % pred: 63 %
DL/VA: 2.62 ml/min/mmHg/L
DLCO cor % pred: 46 %
DLCO cor: 8.53 ml/min/mmHg
DLCO unc % pred: 46 %
DLCO unc: 8.53 ml/min/mmHg
FEF 25-75 Post: 0.46 L/sec
FEF 25-75 Pre: 0.83 L/sec
FEF2575-%Change-Post: -45 %
FEF2575-%Pred-Post: 28 %
FEF2575-%Pred-Pre: 51 %
FEV1-%Change-Post: -13 %
FEV1-%Pred-Post: 62 %
FEV1-%Pred-Pre: 72 %
FEV1-Post: 1.27 L
FEV1-Pre: 1.46 L
FEV1FVC-%Change-Post: 4 %
FEV1FVC-%Pred-Pre: 85 %
FEV6-%Change-Post: -19 %
FEV6-%Pred-Post: 71 %
FEV6-%Pred-Pre: 89 %
FEV6-Post: 1.83 L
FEV6-Pre: 2.28 L
FEV6FVC-%Pred-Post: 105 %
FEV6FVC-%Pred-Pre: 105 %
FVC-%Change-Post: -16 %
FVC-%Pred-Post: 70 %
FVC-%Pred-Pre: 84 %
FVC-Post: 1.89 L
FVC-Pre: 2.28 L
Post FEV1/FVC ratio: 67 %
Post FEV6/FVC ratio: 100 %
Pre FEV1/FVC ratio: 64 %
Pre FEV6/FVC Ratio: 100 %
RV % pred: 128 %
RV: 2.88 L
TLC % pred: 96 %
TLC: 4.75 L

## 2020-08-17 MED ORDER — STIOLTO RESPIMAT 2.5-2.5 MCG/ACT IN AERS
2.0000 | INHALATION_SPRAY | Freq: Every day | RESPIRATORY_TRACT | 11 refills | Status: DC
Start: 2020-08-17 — End: 2020-09-12

## 2020-08-17 MED ORDER — ALBUTEROL SULFATE HFA 108 (90 BASE) MCG/ACT IN AERS
2.0000 | INHALATION_SPRAY | RESPIRATORY_TRACT | 11 refills | Status: DC | PRN
Start: 2020-08-17 — End: 2021-09-16

## 2020-08-17 NOTE — Progress Notes (Signed)
Full PFT completed today ? ?

## 2020-08-17 NOTE — Progress Notes (Signed)
Synopsis: Referred in July 2021 for SOB by Hoyt Koch, *.  Subjective:   PATIENT ID: Wendy Patrick GENDER: female DOB: June 02, 1946, MRN: [id number]  Chief Complaint  Patient presents with  . Follow-up    [name] is a 74 y/o woman who presents for follow-up with PFTs and abnormal CT scan.  She is accompanied today by her husband.  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  She has an EGD scheduled for 9/22 and a follow-up appoint with Dr. Silverio Decamp on 09/06/2020.   Since her last visit she has continued taking Claritin, Flonase, azelastine for postnasal drip.  Her symptoms are improved but not fully controlled.  She continues to have shortness of breath as well.  Her shortness of breath limits her activity.     OV 06/15/20: Wendy Patrick is a 74 year old woman who presents for evaluation of abnormal chest CT. She is accompanied today by her husband.  She had a coronary CTA performed by cardiology which demonstrated emphysema and subcarinal lymphadenopathy.  She has chronic cough and shortness of breath.  Her cough is more significant than her shortness of breath.  She occasionally has yellow sputum, but not always.  Her shortness of breath is worse with exertion.  She sometimes has shortness of breath at rest, but this varies.  This is been ongoing and worsening over several years.  She sometimes wakes up at night with shortness of breath.  She coughs more at night and sounds like she is getting strangled per her husband.  She has rhinorrhea constantly, but denies shortness of breath.  Her husband thinks that she may be having this at night.  She has very infrequent reflux symptoms less than once per month.  She has an allergy to dust.  She takes loratadine daily and has tried nasal washes and steroid nasal spray in the past with temporary relief solution of her symptoms, but they returned.  She quit smoking in 1987 after 40 years x 2 packs/day.  She is not currently using  inhalers.  There is no family history of lung disease.    Past Medical History:  Diagnosis Date  . COPD (chronic obstructive pulmonary disease) ([place])   . Coronary artery disease 08/2009   40% mid left circ by cath in [place], [place]  . Endometriosis    1986  . Gestational diabetes   . Granuloma annulare   . Hyperlipidemia   . Optic neuritis    2015  . Optic neuritis Novembr  . Vaginal anomaly    tears and bleeds easily     Family History  Problem Relation Age of Onset  . Cancer Mother        Breast   . Breast cancer Mother   . Stroke Brother        open heart surgery   . Colon cancer Neg Hx   . Esophageal cancer Neg Hx   . Rectal cancer Neg Hx   . Stomach cancer Neg Hx      Past Surgical History:  Procedure Laterality Date  . ABDOMINAL HYSTERECTOMY    . CARDIAC CATHETERIZATION    . COLONOSCOPY    . EYE SURGERY    . LEFT HEART CATH AND CORONARY ANGIOGRAPHY N/A 04/17/2020   Procedure: LEFT HEART CATH AND CORONARY ANGIOGRAPHY;  Surgeon: Sherren Mocha, MD;  Location: [facility];  Service: Cardiovascular;  Laterality: N/A;  . MANDIBLE SURGERY    . MINOR HEMORRHOIDECTOMY    .  TUBAL LIGATION    . UPPER GASTROINTESTINAL ENDOSCOPY      Social History   Socioeconomic History  . Marital status: Married    Spouse name: Iona Beard  . Number of children: 1  . Years of education: GED  . Highest education level: Not on file  Occupational History  . Occupation: Retired  Tobacco Use  . Smoking status: Former Smoker    Packs/day: 2.00    Years: 40.00    Pack years: 80.00    Types: Cigarettes    Quit date: 02/05/1996    Years since quitting: 24.5  . Smokeless tobacco: Never Used  . Tobacco comment: Educated LDCT for lung Ca to discuss with MD if interested  Vaping Use  . Vaping Use: Never used  Substance and Sexual Activity  . Alcohol use: Yes    Alcohol/week: 3.0 standard drinks    Types: 3 Glasses of wine per week    Comment: wine with dinner   . Drug use: No   . Sexual activity: Never    Partners: Male  Other Topics Concern  . Not on file  Social History Narrative   Lives w/ husband    Caffeine use: Coffee daily   Right handed    Social Determinants of Health   Financial Resource Strain:   . Difficulty of Paying Living Expenses: Not on file  Food Insecurity:   . Worried About Charity fundraiser in the Last Year: Not on file  . Ran Out of Food in the Last Year: Not on file  Transportation Needs:   . Lack of Transportation (Medical): Not on file  . Lack of Transportation (Non-Medical): Not on file  Physical Activity: Unknown  . Days of Exercise per Week: Not on file  . Minutes of Exercise per Session: 30 min  Stress: No Stress Concern Present  . Feeling of Stress : Only a little  Social Connections:   . Frequency of Communication with Friends and Family: Not on file  . Frequency of Social Gatherings with Friends and Family: Not on file  . Attends Religious Services: Not on file  . Active Member of Clubs or Organizations: Not on file  . Attends Archivist Meetings: Not on file  . Marital Status: Not on file  Intimate Partner Violence:   . Fear of Current or Ex-Partner: Not on file  . Emotionally Abused: Not on file  . Physically Abused: Not on file  . Sexually Abused: Not on file     Allergies  Allergen Reactions  . Statins     Leg cramps  . Crestor [Rosuvastatin] Other (See Comments)    Leg cramps  . Latex Rash     Immunization History  Administered Date(s) Administered  . Pneumococcal Conjugate-13 06/05/2016  . Pneumococcal Polysaccharide-23 12/01/2009, 08/02/2019, 08/22/2019  . Tdap 12/01/2010  . Zoster 12/02/2011    Outpatient Medications Prior to Visit  Medication Sig Dispense Refill  . aspirin EC 81 MG tablet Take 81 mg by mouth daily.    Marland Kitchen azelastine (ASTELIN) 0.1 % nasal spray Place 2 sprays into both nostrils 2 (two) times daily. Use in each nostril as directed 30 mL 12  . cholecalciferol  (VITAMIN D) 1000 units tablet Take 10,000 Units by mouth daily.     . fluticasone (FLONASE) 50 MCG/ACT nasal spray Place 2 sprays into both nostrils daily. 16 g 11  . KRILL OIL PO Take 250 mg by mouth daily.    . Loratadine 10 MG CAPS  Take 10 mg by mouth daily.    . methocarbamol (ROBAXIN) 500 MG tablet Take 1 tablet (500 mg total) by mouth 3 (three) times daily. 90 tablet 5   No facility-administered medications prior to visit.    Review of Systems  Constitutional: Negative for chills, fever and weight loss.  HENT: Positive for congestion.        Rhinorrhea  Respiratory: Positive for cough, sputum production, shortness of breath and wheezing. Negative for hemoptysis.   Gastrointestinal: Negative for heartburn and nausea.  Musculoskeletal: Positive for back pain and joint pain.       Chronic joint pains  Endo/Heme/Allergies: Positive for environmental allergies.     Objective:   Vitals:   08/17/20 1255  BP: (!) 120/58  Pulse: 65  Temp: (!) 97.1 F (36.2 C)  TempSrc: Temporal  SpO2: 99%  Weight: 134 lb 9.6 oz (61.1 kg)  Height: 5\' 3"  (1.6 m)   99% on  RA BMI Readings from Last 3 Encounters:  08/17/20 23.84 kg/m  08/16/20 23.34 kg/m  06/12/20 23.17 kg/m   Wt Readings from Last 3 Encounters:  08/17/20 134 lb 9.6 oz (61.1 kg)  08/16/20 136 lb (61.7 kg)  06/12/20 135 lb (61.2 kg)    Physical Exam Vitals reviewed.  Constitutional:      General: She is not in acute distress.    Appearance: She is not ill-appearing.  HENT:     Head: Normocephalic and atraumatic.  Eyes:     General: No scleral icterus. Cardiovascular:     Rate and Rhythm: Normal rate and regular rhythm.  Pulmonary:     Comments: CTAB, no conversational dyspnea Abdominal:     General: There is no distension.     Palpations: Abdomen is soft.     Tenderness: There is no abdominal tenderness.  Musculoskeletal:        General: No swelling or deformity.     Cervical back: Neck supple.   Lymphadenopathy:     Cervical: No cervical adenopathy.  Skin:    General: Skin is warm and dry.     Findings: No rash.  Neurological:     General: No focal deficit present.     Mental Status: She is alert.     Coordination: Coordination normal.  Psychiatric:        Mood and Affect: Mood normal.        Behavior: Behavior normal.      CBC    Component Value Date/Time   WBC 6.5 04/13/2020 1047   WBC 6.6 02/01/2019 0830   RBC 5.31 (H) 04/13/2020 1047   RBC 5.30 (H) 02/01/2019 0830   HGB 14.3 04/13/2020 1047   HCT 44.5 04/13/2020 1047   PLT 375 04/13/2020 1047   MCV 84 04/13/2020 1047   MCH 26.9 04/13/2020 1047   MCH 28.3 10/10/2014 0834   MCHC 32.1 04/13/2020 1047   MCHC 32.8 02/01/2019 0830   RDW 12.7 04/13/2020 1047   LYMPHSABS 1.6 03/08/2018 0932   MONOABS 0.7 03/08/2018 0932   EOSABS 0.2 03/08/2018 0932   BASOSABS 0.0 03/08/2018 0932    CHEMISTRY No results for input(s): NA, K, CL, CO2, GLUCOSE, BUN, CREATININE, CALCIUM, MG, PHOS in the last 168 hours. CrCl cannot be calculated (Patient's most recent lab result is older than the maximum 21 days allowed.).   Chest Imaging- films reviewed: CT coronary 04/04/2020, lung images reviewed -  2.1 cm asymmetric subcarinal density more to the right.  Centrally calcified RML paramediastinal nodule.  Significant emphysema. CAD RADS 3 coronary disease  Pulmonary Functions Testing Results: PFT Results Latest Ref Rng & Units 08/17/2020  FVC-Pre L 2.28  FVC-Predicted Pre % 84  FVC-Post L 1.89  FVC-Predicted Post % 70  Pre FEV1/FVC % % 64  Post FEV1/FCV % % 67  FEV1-Pre L 1.46  FEV1-Predicted Pre % 72  FEV1-Post L 1.27  DLCO uncorrected ml/min/mmHg 8.53  DLCO UNC% % 46  DLCO corrected ml/min/mmHg 8.53  DLCO COR %Predicted % 46  DLVA Predicted % 63  TLC L 4.75  TLC % Predicted % 96  RV % Predicted % 128   2021-no significant obstruction or bronchodilator reversibility.  Air-trapping is present.  Moderately reduced  diffusion capacity.  Flow volume loop suggests obstruction.  Heart Catheterization 04/17/2020:     1.  Nonobstructive coronary artery disease with mild nonobstructive stenosis of the proximal to mid LAD and moderate nonobstructive stenosis of the mid left circumflex, confirmed to be nonobstructive by invasive RFR evaluation 2.  Normal LVEDP  Recommendations: Ongoing medical therapy.  While the patient does not have severe coronary stenoses, she does have diffuse plaquing in the proximal LAD and proximal to mid circumflex and is appropriate for aggressive risk reduction measures.     Assessment & Plan:     ICD-10-CM   1. Chronic obstructive pulmonary disease, unspecified COPD type ([place])  J44.9     COPD- borderline by ATS criteria, but positive by GOLD criteria.  Given her symptoms and smoking history, especially in the setting of severely reduced DLCO, I feel that she is a false negative by ATS criteria. -Start Stiolto once daily. -Start albuterol every 4 hours as needed for uncontrolled symptoms. -Will eventually need lung cancer screening once she is passed through require follow-up period for her mediastinal nodule.  Abnormal chest CT with subcarinal mass versus lymphadenopathy -EGD with EUS to evaluate for likely esophageal duplication cyst.  If this is negative, will plan for 45-month follow-up CT scan with contrast.  postnasal drip -Continue loratadine once daily -Adding azelastine twice daily and Flonase once daily.  RTC in 3 months with Dr. Erin Fulling.   Current Outpatient Medications:  .  aspirin EC 81 MG tablet, Take 81 mg by mouth daily., Disp: , Rfl:  .  azelastine (ASTELIN) 0.1 % nasal spray, Place 2 sprays into both nostrils 2 (two) times daily. Use in each nostril as directed, Disp: 30 mL, Rfl: 12 .  cholecalciferol (VITAMIN D) 1000 units tablet, Take 10,000 Units by mouth daily. , Disp: , Rfl:  .  fluticasone (FLONASE) 50 MCG/ACT nasal spray, Place 2 sprays into both  nostrils daily., Disp: 16 g, Rfl: 11 .  KRILL OIL PO, Take 250 mg by mouth daily., Disp: , Rfl:  .  Loratadine 10 MG CAPS, Take 10 mg by mouth daily., Disp: , Rfl:  .  methocarbamol (ROBAXIN) 500 MG tablet, Take 1 tablet (500 mg total) by mouth 3 (three) times daily., Disp: 90 tablet, Rfl: 5 .  albuterol (VENTOLIN HFA) 108 (90 Base) MCG/ACT inhaler, Inhale 2 puffs into the lungs every 4 (four) hours as needed for wheezing or shortness of breath., Disp: 18 g, Rfl: 11 .  Tiotropium Bromide-Olodaterol (STIOLTO RESPIMAT) 2.5-2.5 MCG/ACT AERS, Inhale 2 puffs into the lungs daily., Disp: 4 g, Rfl: Dawn Katalina Magri, DO [facility] 08/17/2020 1:21 PM

## 2020-08-17 NOTE — Patient Instructions (Addendum)
Thank you for visiting Dr. Carlis Abbott at [facility]. We recommend the following:   Meds ordered this encounter  Medications  . Tiotropium Bromide-Olodaterol (STIOLTO RESPIMAT) 2.5-2.5 MCG/ACT AERS    Sig: Inhale 2 puffs into the lungs daily.    Dispense:  4 g    Refill:  11  . albuterol (VENTOLIN HFA) 108 (90 Base) MCG/ACT inhaler    Sig: Inhale 2 puffs into the lungs every 4 (four) hours as needed for wheezing or shortness of breath.    Dispense:  18 g    Refill:  11     I recommend you receive your covid and flu vaccines to help prevent deterioration of your COPD.   Return in about 3 months (around 11/16/2020). with Dr. Erin Fulling (30 minutes visit).    Please do your part to reduce the spread of COVID-19.

## 2020-08-18 LAB — LIPID PANEL
Chol/HDL Ratio: 5.2 ratio — ABNORMAL HIGH (ref 0.0–4.4)
Cholesterol, Total: 296 mg/dL — ABNORMAL HIGH (ref 100–199)
HDL: 57 mg/dL (ref >39)
LDL Chol Calc (NIH): 191 mg/dL — ABNORMAL HIGH (ref 0–99)
Triglycerides: 248 mg/dL — ABNORMAL HIGH (ref 0–149)
VLDL Cholesterol Cal: 48 mg/dL — ABNORMAL HIGH (ref 5–40)

## 2020-08-21 ENCOUNTER — Telehealth: Payer: Self-pay | Admitting: Critical Care Medicine

## 2020-08-21 NOTE — Telephone Encounter (Signed)
PA request received from [facility] pharmacy  Drug requested: Stiolto CMM Key: [id number] Tried/failed: on file Covered alternatives: anoro, bevespi, combivent, serevent, trelegy PA request has been sent to plan, and a determination is expected within 3 days.   Routing to [facility] for follow-up.

## 2020-08-22 ENCOUNTER — Telehealth: Payer: Self-pay | Admitting: *Deleted

## 2020-08-22 ENCOUNTER — Encounter: Payer: Self-pay | Admitting: Gastroenterology

## 2020-08-22 ENCOUNTER — Telehealth: Payer: Self-pay | Admitting: Critical Care Medicine

## 2020-08-22 ENCOUNTER — Other Ambulatory Visit: Payer: Self-pay

## 2020-08-22 ENCOUNTER — Ambulatory Visit (AMBULATORY_SURGERY_CENTER): Payer: Self-pay | Admitting: *Deleted

## 2020-08-22 VITALS — Ht 64.0 in | Wt 133.0 lb

## 2020-08-22 DIAGNOSIS — J9859 Other diseases of mediastinum, not elsewhere classified: Secondary | ICD-10-CM

## 2020-08-22 DIAGNOSIS — Z86010 Personal history of colonic polyps: Secondary | ICD-10-CM

## 2020-08-22 MED ORDER — SUTAB 1479-225-188 MG PO TABS: 1.0000 | ORAL_TABLET | Freq: Once | ORAL | 0 refills | Status: AC

## 2020-08-22 NOTE — Telephone Encounter (Signed)
Disregard, I see that she was scheduled already for double, sorry.

## 2020-08-22 NOTE — Telephone Encounter (Signed)
Received denial for the stiolto  Covered alternatives are anoro, bevespi, combivent, serevent, trelegy I have placed appeals information in Dr Ainsley Spinner lookat in C pod  Please advise, thanks

## 2020-08-22 NOTE — Progress Notes (Signed)

## 2020-08-22 NOTE — Telephone Encounter (Deleted)
Dr Silverio Decamp,   I saw Wendy Patrick in pre-visit as a colon recall,  She advised that she was told by Pulmonologist that she was to have a EGD due to mass, please read encounters.  I changed her appt to accommodate a double based on most recent notes. Please advise if this is ok to proceed. Thanks!

## 2020-08-22 NOTE — Telephone Encounter (Signed)
Spoke with the pt  She states that her Stiolto was denied at [employer] needed PA- one was started yesterday- see phone note for details and will close this encounter since it is regarding same.

## 2020-08-23 ENCOUNTER — Other Ambulatory Visit: Payer: Self-pay

## 2020-08-23 MED ORDER — BEVESPI AEROSPHERE 9-4.8 MCG/ACT IN AERO: 2.0000 | INHALATION_SPRAY | Freq: Two times a day (BID) | RESPIRATORY_TRACT | 6 refills | Status: DC

## 2020-08-23 NOTE — Progress Notes (Signed)
Called and spoke with patient to let her know that [place] was denied by her insurance. Let her know that we received a letter letting us know what alternative is covered and that we were going to sending in a RX for Bevespi to her pharmacy. Patient expressed understanding. Nothing further needed at this time

## 2020-08-28 NOTE — Telephone Encounter (Signed)
I thought you had sent the script for Anoro? If not, my preference among those would be Anoro 1 puff once daily. Thanks!  LPC

## 2020-08-28 NOTE — Telephone Encounter (Signed)
Dr. Carlis Abbott please advise on alternative

## 2020-08-29 NOTE — Telephone Encounter (Signed)
Some patients noticed that once they are on inhalers they do clear more sputum. As long as she is not running fevers this may not be of clinical significance.  Coughing may be due to using a dry powder inhaler.  She should continue to use it and see if it improves.  Usually if it is due to the inhaler it would be shortly after using it, but improves throughout the rest of the day.  Unfortunately Anoro could have a similar effect, but if this persists that would be the next inhaler to try given her insurance coverage.  LPC

## 2020-08-29 NOTE — Telephone Encounter (Signed)
Called and spoke with patient to let her know that [name] is not covered by insurance. Let her know that Dr. Carlis Abbott was recommending for [place] to send in Anoro to her pharmacy. Patient informed me that she has already been prescribed a different inhaler. She was prescribed Bevespi on 9/23.   Patient states that she still has a cough with clear sputum with a little yellow, feels like she is coughing more than she was before. States she has been using her Azelastine nasal spray, fluticasone nasal spray and the Bevespi since she picked it up.   Dr. Carlis Abbott please advise

## 2020-09-06 ENCOUNTER — Encounter: Payer: Medicare HMO | Admitting: Gastroenterology

## 2020-09-06 ENCOUNTER — Other Ambulatory Visit: Payer: Self-pay | Admitting: Gastroenterology

## 2020-09-06 DIAGNOSIS — Z1159 Encounter for screening for other viral diseases: Secondary | ICD-10-CM | POA: Diagnosis not present

## 2020-09-06 LAB — SARS CORONAVIRUS 2 (TAT 6-24 HRS): SARS Coronavirus 2: NEGATIVE

## 2020-09-10 ENCOUNTER — Encounter: Payer: Self-pay | Admitting: Gastroenterology

## 2020-09-10 ENCOUNTER — Other Ambulatory Visit: Payer: Self-pay

## 2020-09-10 ENCOUNTER — Ambulatory Visit (AMBULATORY_SURGERY_CENTER): Payer: Medicare HMO | Admitting: Gastroenterology

## 2020-09-10 VITALS — BP 137/67 | HR 65 | Temp 97.1°F | Resp 17 | Ht 64.0 in | Wt 130.3 lb

## 2020-09-10 DIAGNOSIS — R948 Abnormal results of function studies of other organs and systems: Secondary | ICD-10-CM

## 2020-09-10 DIAGNOSIS — R1310 Dysphagia, unspecified: Secondary | ICD-10-CM

## 2020-09-10 DIAGNOSIS — R933 Abnormal findings on diagnostic imaging of other parts of digestive tract: Secondary | ICD-10-CM

## 2020-09-10 DIAGNOSIS — K621 Rectal polyp: Secondary | ICD-10-CM

## 2020-09-10 DIAGNOSIS — Z860100 Personal history of colon polyps, unspecified: Secondary | ICD-10-CM

## 2020-09-10 DIAGNOSIS — Z1211 Encounter for screening for malignant neoplasm of colon: Secondary | ICD-10-CM | POA: Diagnosis not present

## 2020-09-10 DIAGNOSIS — K449 Diaphragmatic hernia without obstruction or gangrene: Secondary | ICD-10-CM

## 2020-09-10 DIAGNOSIS — N870 Mild cervical dysplasia: Secondary | ICD-10-CM | POA: Diagnosis not present

## 2020-09-10 DIAGNOSIS — K440 Diaphragmatic hernia with obstruction, without gangrene: Secondary | ICD-10-CM | POA: Diagnosis not present

## 2020-09-10 DIAGNOSIS — K629 Disease of anus and rectum, unspecified: Secondary | ICD-10-CM | POA: Diagnosis not present

## 2020-09-10 DIAGNOSIS — J9859 Other diseases of mediastinum, not elsewhere classified: Secondary | ICD-10-CM

## 2020-09-10 MED ORDER — SODIUM CHLORIDE 0.9 % IV SOLN: 500.0000 mL | Freq: Once | INTRAVENOUS | Status: DC

## 2020-09-10 NOTE — Progress Notes (Signed)
To PACU, VSS. Report to [email or web address] 

## 2020-09-10 NOTE — Patient Instructions (Signed)
Handouts given for hemorrhoids and  Hiatal Hernia.  No NSAIDS (Non-Steroidal anti-inflammatory drugs) for at least 2 weeks.  (These include, aspirin, aspirin-containing products, ibuprofen, advil, motrin, naproxen, aleve, goody powders, etc) Tylenol is ok to take as needed, see label for instructions.    YOU HAD AN ENDOSCOPIC PROCEDURE TODAY AT [facility]:   Refer to the procedure report that was given to you for any specific questions about what was found during the examination.  If the procedure report does not answer your questions, please call your gastroenterologist to clarify.  If you requested that your care partner not be given the details of your procedure findings, then the procedure report has been included in a sealed envelope for you to review at your convenience later.  YOU SHOULD EXPECT: Some feelings of bloating in the abdomen. Passage of more gas than usual.  Walking can help get rid of the air that was put into your GI tract during the procedure and reduce the bloating. If you had a lower endoscopy (such as a colonoscopy or flexible sigmoidoscopy) you may notice spotting of blood in your stool or on the toilet paper. If you underwent a bowel prep for your procedure, you may not have a normal bowel movement for a few days.  Please Note:  You might notice some irritation and congestion in your nose or some drainage.  This is from the oxygen used during your procedure.  There is no need for concern and it should clear up in a day or so.  SYMPTOMS TO REPORT IMMEDIATELY:   Following lower endoscopy (colonoscopy or flexible sigmoidoscopy):  Excessive amounts of blood in the stool  Significant tenderness or worsening of abdominal pains  Swelling of the abdomen that is new, acute  Fever of 100F or higher   Following upper endoscopy (EGD)  Vomiting of blood or coffee ground material  New chest pain or pain under the shoulder blades  Painful or persistently  difficult swallowing  New shortness of breath  Fever of 100F or higher  Black, tarry-looking stools  For urgent or emergent issues, a gastroenterologist can be reached at any hour by calling [phone]. Do not use MyChart messaging for urgent concerns.    DIET:  We do recommend a small meal at first, but then you may proceed to your regular diet.  Drink plenty of fluids but you should avoid alcoholic beverages for 24 hours.  ACTIVITY:  You should plan to take it easy for the rest of today and you should NOT DRIVE or use heavy machinery until tomorrow (because of the sedation medicines used during the test).    FOLLOW UP: Our staff will call the number listed on your records 48-72 hours following your procedure to check on you and address any questions or concerns that you may have regarding the information given to you following your procedure. If we do not reach you, we will leave a message.  We will attempt to reach you two times.  During this call, we will ask if you have developed any symptoms of COVID 19. If you develop any symptoms (ie: fever, flu-like symptoms, shortness of breath, cough etc.) before then, please call [phone].  If you test positive for Covid 19 in the 2 weeks post procedure, please call and report this information to [place].    If any biopsies were taken you will be contacted by phone or by letter within the next 1-3 weeks.  Please call us at [phone] if you have not heard about the biopsies in 3 weeks.    SIGNATURES/CONFIDENTIALITY: You and/or your care partner have signed paperwork which will be entered into your electronic medical record.  These signatures attest to the fact that that the information above on your After Visit Summary has been reviewed and is understood.  Full responsibility of the confidentiality of this discharge information lies with you and/or your care-partner.

## 2020-09-10 NOTE — Progress Notes (Signed)
Pt. Reports no change in her medical or surgical history since her pre-visit 08/22/2020.

## 2020-09-10 NOTE — Op Note (Signed)
[place] Patient Name: Wendy Patrick Procedure Date: 09/10/2020 8:36 AM MRN: [id number] Endoscopist: Mauri Pole , MD Age: 74 Referring MD:  Date of Birth: 06-19-1946 Gender: Female Account #: [id number] Procedure:                Colonoscopy Indications:              High risk colon cancer surveillance: Personal                            history of colonic polyps, High risk colon cancer                            surveillance: Personal history of adenoma (10 mm or                            greater in size) Medicines:                Monitored Anesthesia Care Procedure:                Pre-Anesthesia Assessment:                           - Prior to the procedure, a History and Physical                            was performed, and patient medications and                            allergies were reviewed. The patient's tolerance of                            previous anesthesia was also reviewed. The risks                            and benefits of the procedure and the sedation                            options and risks were discussed with the patient.                            All questions were answered, and informed consent                            was obtained. Prior Anticoagulants: The patient has                            taken no previous anticoagulant or antiplatelet                            agents. ASA Grade Assessment: III - A patient with                            severe systemic disease. After reviewing the risks  and benefits, the patient was deemed in                            satisfactory condition to undergo the procedure.                           After obtaining informed consent, the colonoscope                            was passed under direct vision. Throughout the                            procedure, the patient's blood pressure, pulse, and                            oxygen saturations were monitored  continuously. The                            Colonoscope was introduced through the anus and                            advanced to the the cecum, identified by                            appendiceal orifice and ileocecal valve. The                            colonoscopy was performed without difficulty. The                            patient tolerated the procedure well. The quality                            of the bowel preparation was adequate. The                            ileocecal valve, appendiceal orifice, and rectum                            were photographed. Scope In: 8:48:16 AM Scope Out: 9:06:35 AM Scope Withdrawal Time: 0 hours 11 minutes 57 seconds  Total Procedure Duration: 0 hours 18 minutes 19 seconds  Findings:                 The perianal and digital rectal examinations were                            normal.                           A 12 mm polypoid lesion was found at the anus. The                            lesion was frond-like/villous, polypoid and  multi-lobulated. Biopsies were taken with a cold                            forceps for histology.                           Four localized/isolated superficial non-bleeding                            erosions were found in the rectum, in the ascending                            colon and in the cecum.                           Non-bleeding internal hemorrhoids were found during                            retroflexion. The hemorrhoids were small. Complications:            No immediate complications. Estimated Blood Loss:     Estimated blood loss was minimal. Impression:               - Rule out malignancy, polypoid lesion at the anus.                            Biopsied.                           - Four erosions in the rectum, in the ascending                            colon and in the cecum likely secondary to NSAID's .                           - Non-bleeding internal  hemorrhoids. Recommendation:           - Patient has a contact number available for                            emergencies. The signs and symptoms of potential                            delayed complications were discussed with the                            patient. Return to normal activities tomorrow.                            Written discharge instructions were provided to the                            patient.                           - Resume previous diet.                           -  Continue present medications.                           - No ibuprofen, naproxen, or other non-steroidal                            anti-inflammatory drugs.                           - Await pathology results.                           - Repeat colonoscopy date to be determined after                            pending pathology results are reviewed for                            surveillance based on pathology results. Mauri Pole, MD 09/10/2020 9:14:25 AM This report has been signed electronically.

## 2020-09-10 NOTE — Progress Notes (Signed)
Called to room to assist during endoscopic procedure.  Patient ID and intended procedure confirmed with present staff. Received instructions for my participation in the procedure from the performing physician.  

## 2020-09-10 NOTE — Op Note (Signed)
[facility] Patient Name: Wendy Patrick Procedure Date: 09/10/2020 8:36 AM MRN: [id number] Endoscopist: Mauri Pole , MD Age: 74 Referring MD:  Date of Birth: 1946/07/10 Gender: Female Account #: [id number] Procedure:                Upper GI endoscopy Indications:              Abnormal CT of the GI tract, Abnormal PET scan of                            the GI tract Medicines:                Monitored Anesthesia Care Procedure:                Pre-Anesthesia Assessment:                           - Prior to the procedure, a History and Physical                            was performed, and patient medications and                            allergies were reviewed. The patient's tolerance of                            previous anesthesia was also reviewed. The risks                            and benefits of the procedure and the sedation                            options and risks were discussed with the patient.                            All questions were answered, and informed consent                            was obtained. Prior Anticoagulants: The patient has                            taken no previous anticoagulant or antiplatelet                            agents. ASA Grade Assessment: III - A patient with                            severe systemic disease. After reviewing the risks                            and benefits, the patient was deemed in                            satisfactory condition to undergo the procedure.  After obtaining informed consent, the endoscope was                            passed under direct vision. Throughout the                            procedure, the patient's blood pressure, pulse, and                            oxygen saturations were monitored continuously. The                            Endoscope was introduced through the mouth, and                            advanced to the second part of  duodenum. The upper                            GI endoscopy was accomplished without difficulty.                            The patient tolerated the procedure well. Scope In: Scope Out: Findings:                 No gross lesions were noted in the entire esophagus.                           The Z-line was regular and was found 35 cm from the                            incisors.                           A small hiatal hernia was present.                           The gastroesophageal flap valve was visualized                            endoscopically and classified as Hill Grade III                            (minimal fold, loose to endoscope, hiatal hernia                            likely).                           The stomach was normal.                           The cardia and gastric fundus were normal on                            retroflexion.  The examined duodenum was normal. Complications:            No immediate complications. Estimated Blood Loss:     Estimated blood loss was minimal. Impression:               - No gross lesions in esophagus.                           - Z-line regular, 35 cm from the incisors.                           - Small hiatal hernia.                           - Gastroesophageal flap valve classified as Hill                            Grade III (minimal fold, loose to endoscope, hiatal                            hernia likely).                           - Normal stomach.                           - Normal examined duodenum.                           - No specimens collected. Recommendation:           - Patient has a contact number available for                            emergencies. The signs and symptoms of potential                            delayed complications were discussed with the                            patient. Return to normal activities tomorrow.                            Written discharge instructions  were provided to the                            patient.                           - Resume previous diet.                           - Continue present medications.                           - See the other procedure note for documentation of  additional recommendations. Mauri Pole, MD 09/10/2020 9:10:21 AM This report has been signed electronically.

## 2020-09-12 ENCOUNTER — Telehealth: Payer: Self-pay | Admitting: *Deleted

## 2020-09-12 ENCOUNTER — Telehealth: Payer: Self-pay | Admitting: Critical Care Medicine

## 2020-09-12 MED ORDER — ANORO ELLIPTA 62.5-25 MCG/INH IN AEPB: 1.0000 | INHALATION_SPRAY | Freq: Every day | RESPIRATORY_TRACT | 0 refills | Status: DC

## 2020-09-12 NOTE — Telephone Encounter (Signed)
Attempted 2nd f/u phone call. No answer. Left message.  °

## 2020-09-12 NOTE — Telephone Encounter (Signed)
Agree, add to allergy list. Has she tried Anoro, or can we do paperwork for patient assistance or PA for Stiolto ?  LPC

## 2020-09-12 NOTE — Telephone Encounter (Signed)
I would add bevespi to allergy list. If they already said no on stiolto once, let's try Anoro first before appealing.  LPC

## 2020-09-12 NOTE — Telephone Encounter (Signed)
First follow up call made, left message. 

## 2020-09-12 NOTE — Telephone Encounter (Signed)
Called and spoke with patient she was prescribed Bevespi on 9/23 and states that since she started using it she feels like it is irritating her throat and felt like it was closing. States that she continued to use it and feels like it just kept getting worse. Has not used the inhaler since Saturday morning felt like throat is closing and could hardly breath and throat feels better since then. Advised patient to stop taking inhaler. Stiolto was already denied by her insurance.  Dr. Carlis Abbott please advise

## 2020-09-12 NOTE — Telephone Encounter (Signed)
Called and spoke with patient to let her know that Dr. Carlis Abbott would like her to try an inhaler called Anoro. Advised her that I would leave sample upfront for her to pick up so she can try it and then if it works and she notices a difference for her to call and let us know and we would send in RX for Anoro. Patient expressed understanding. Charolotte Eke has been added to her allergy list. Sample placed upfront. Nothing further needed at this time.

## 2020-09-12 NOTE — Telephone Encounter (Signed)
She has not tried Anoro according to her med list. Do you have a preference over Anoro or Stiolto?  We have done PA for Stiolto already it was denied we can try again since she has failed Bevespi or try Anoro?  Do you want Bevespi added to her allergy list?

## 2020-09-18 ENCOUNTER — Telehealth: Payer: Self-pay | Admitting: Critical Care Medicine

## 2020-09-18 NOTE — Telephone Encounter (Signed)
ATC patient X1 got busy signal will try again later

## 2020-09-20 ENCOUNTER — Other Ambulatory Visit: Payer: Self-pay | Admitting: Critical Care Medicine

## 2020-09-20 MED ORDER — ANORO ELLIPTA 62.5-25 MCG/INH IN AEPB: 1.0000 | INHALATION_SPRAY | Freq: Every day | RESPIRATORY_TRACT | 11 refills | Status: DC

## 2020-09-20 NOTE — Progress Notes (Signed)
Anoro prescribed to [facility] pharmacy per request.  Julian Hy, DO 09/20/20 12:34 PM [facility]

## 2020-09-21 NOTE — Telephone Encounter (Signed)
Rx for Anoro was sent to pharmacy on 09/20/2020. Patient stated that she will pick Rx up from pharmacy.  Nothing further needed.

## 2020-10-22 DIAGNOSIS — K6282 Dysplasia of anus: Secondary | ICD-10-CM | POA: Diagnosis not present

## 2020-10-23 DIAGNOSIS — H40013 Open angle with borderline findings, low risk, bilateral: Secondary | ICD-10-CM | POA: Diagnosis not present

## 2020-10-23 DIAGNOSIS — H35372 Puckering of macula, left eye: Secondary | ICD-10-CM | POA: Diagnosis not present

## 2020-10-23 DIAGNOSIS — H10413 Chronic giant papillary conjunctivitis, bilateral: Secondary | ICD-10-CM | POA: Diagnosis not present

## 2020-10-23 DIAGNOSIS — Z961 Presence of intraocular lens: Secondary | ICD-10-CM | POA: Diagnosis not present

## 2020-10-23 DIAGNOSIS — H0102B Squamous blepharitis left eye, upper and lower eyelids: Secondary | ICD-10-CM | POA: Diagnosis not present

## 2020-10-23 DIAGNOSIS — H469 Unspecified optic neuritis: Secondary | ICD-10-CM | POA: Diagnosis not present

## 2020-10-23 DIAGNOSIS — H0102A Squamous blepharitis right eye, upper and lower eyelids: Secondary | ICD-10-CM | POA: Diagnosis not present

## 2020-10-23 DIAGNOSIS — H26493 Other secondary cataract, bilateral: Secondary | ICD-10-CM | POA: Diagnosis not present

## 2020-10-31 ENCOUNTER — Telehealth: Payer: Self-pay | Admitting: Internal Medicine

## 2020-10-31 NOTE — Telephone Encounter (Signed)
Spoke to patient about scheduling AWV with [facility]. She stated that she has a lo going on but will call the office to schedule appt with [facility] and PCP in Jan.

## 2020-11-15 ENCOUNTER — Encounter: Payer: Self-pay | Admitting: Pulmonary Disease

## 2020-11-15 ENCOUNTER — Ambulatory Visit: Payer: Medicare HMO | Admitting: Pulmonary Disease

## 2020-11-15 ENCOUNTER — Other Ambulatory Visit: Payer: Self-pay | Admitting: Pulmonary Disease

## 2020-11-15 ENCOUNTER — Other Ambulatory Visit: Payer: Self-pay

## 2020-11-15 VITALS — BP 118/60 | HR 83 | Temp 97.5°F | Ht 63.0 in | Wt 134.8 lb

## 2020-11-15 DIAGNOSIS — R053 Chronic cough: Secondary | ICD-10-CM

## 2020-11-15 DIAGNOSIS — J439 Emphysema, unspecified: Secondary | ICD-10-CM

## 2020-11-15 DIAGNOSIS — J449 Chronic obstructive pulmonary disease, unspecified: Secondary | ICD-10-CM

## 2020-11-15 DIAGNOSIS — J9859 Other diseases of mediastinum, not elsewhere classified: Secondary | ICD-10-CM

## 2020-11-15 DIAGNOSIS — Z5181 Encounter for therapeutic drug level monitoring: Secondary | ICD-10-CM

## 2020-11-15 MED ORDER — IPRATROPIUM BROMIDE 0.03 % NA SOLN: 2.0000 | Freq: Two times a day (BID) | NASAL | 12 refills | Status: DC

## 2020-11-15 MED ORDER — PANTOPRAZOLE SODIUM 40 MG PO TBEC: 40.0000 mg | DELAYED_RELEASE_TABLET | Freq: Every day | ORAL | 3 refills | Status: DC

## 2020-11-15 NOTE — Progress Notes (Signed)
Synopsis: Referred in July 2021 for SOB by Hoyt Koch,  Subjective:   PATIENT ID: Wendy Patrick GENDER: female DOB: 1945-12-04, MRN: [id number]   HPI  Chief Complaint  Patient presents with  . Follow-up    COPD, still coughing with SOB   Wendy Patrick is a 74 year old woman, former smoker with COPD/Emphysema, subcarinal mass, chronic rhinitis, post-nasal drip and chronic cough who returns to pulmonary clinic for follow up   Since her last visit she has continued taking Claritin, Flonase, azelastine for postnasal drip.  Her symptoms are improved but not fully controlled.  Her shortness of breath is somewhat improved since starting anoro. She complains of genralized fatigue that seems to be worsening.   She had PET CT scan performed on 06/28/20 for evaluation of a subcarinal mass which did not demonstrate hypermetabolism. She underwent EGD and colonoscopy on 09/10/20 with a small hiatal hernia noted. No EUS was noted in the report in regards to evaluation of the subcarinal mass with the differential of GI duplication cyst. Colonoscopy revealed anal polyps which biopsies are suggestive of low-grade dysplasia, precancerous lesion for anal cancer. She has been referred to surgery for further evaluation and monitoring.   She has history of GERD and they sleep with the head of the bed elevated.      She quit smoking in 1987 after 40 years x 2 packs/day.  There is no family history of lung disease.  Past Medical History:  Diagnosis Date  . COPD (chronic obstructive pulmonary disease) ([place])   . Coronary artery disease 08/2009   40% mid left circ by cath in [place], [place]  . Endometriosis    1986  . Granuloma annulare   . Hyperlipidemia   . Optic neuritis    2015  . Optic neuritis Novembr  . Vaginal anomaly    tears and bleeds easily     Family History  Problem Relation Age of Onset  . Cancer Mother        Breast   . Breast cancer Mother   . Stroke Brother        open  heart surgery   . Colon cancer Neg Hx   . Esophageal cancer Neg Hx   . Rectal cancer Neg Hx   . Stomach cancer Neg Hx      Social History   Socioeconomic History  . Marital status: Married    Spouse name: Iona Beard  . Number of children: 1  . Years of education: GED  . Highest education level: Not on file  Occupational History  . Occupation: Retired  Tobacco Use  . Smoking status: Former Smoker    Packs/day: 2.00    Years: 40.00    Pack years: 80.00    Types: Cigarettes    Quit date: 02/05/1996    Years since quitting: 24.7  . Smokeless tobacco: Never Used  . Tobacco comment: Educated LDCT for lung Ca to discuss with MD if interested  Vaping Use  . Vaping Use: Never used  Substance and Sexual Activity  . Alcohol use: Yes    Alcohol/week: 3.0 standard drinks    Types: 3 Glasses of wine per week    Comment: wine with dinner   . Drug use: No  . Sexual activity: Never    Partners: Male  Other Topics Concern  . Not on file  Social History Narrative   Lives w/ husband    Caffeine use: Coffee daily   Right handed  Social Determinants of Health   Financial Resource Strain: Not on file  Food Insecurity: Not on file  Transportation Needs: Not on file  Physical Activity: Not on file  Stress: Not on file  Social Connections: Not on file  Intimate Partner Violence: Not on file     Allergies  Allergen Reactions  . Bevespi Aerosphere [Glycopyrrolate-Formoterol] Other (See Comments)    Patient felt like her throat was closing up  . Statins     Leg cramps  . Crestor [Rosuvastatin] Other (See Comments)    Leg cramps  . Latex Rash     Outpatient Medications Prior to Visit  Medication Sig Dispense Refill  . albuterol (VENTOLIN HFA) 108 (90 Base) MCG/ACT inhaler Inhale 2 puffs into the lungs every 4 (four) hours as needed for wheezing or shortness of breath. 18 g 11  . aspirin EC 81 MG tablet Take 81 mg by mouth daily.    Marland Kitchen azelastine (ASTELIN) 0.1 % nasal spray Place 2  sprays into both nostrils 2 (two) times daily. Use in each nostril as directed 30 mL 12  . cholecalciferol (VITAMIN D) 1000 units tablet Take 10,000 Units by mouth daily.     . fluticasone (FLONASE) 50 MCG/ACT nasal spray Place 2 sprays into both nostrils daily. 16 g 11  . KRILL OIL PO Take 250 mg by mouth daily.    . Loratadine 10 MG CAPS Take 10 mg by mouth daily.    . methocarbamol (ROBAXIN) 500 MG tablet Take 1 tablet (500 mg total) by mouth 3 (three) times daily. 90 tablet 5  . umeclidinium-vilanterol (ANORO ELLIPTA) 62.5-25 MCG/INH AEPB Inhale 1 puff into the lungs daily. 1 each 11  . umeclidinium-vilanterol (ANORO ELLIPTA) 62.5-25 MCG/INH AEPB Inhale 1 puff into the lungs daily. 60 each 0   Facility-Administered Medications Prior to Visit  Medication Dose Route Frequency Provider Last Rate Last Admin  . 0.9 %  sodium chloride infusion  500 mL Intravenous Once Nandigam, Venia Minks, MD        Review of Systems  Constitutional: Positive for malaise/fatigue. Negative for chills, diaphoresis, fever and weight loss.  HENT: Negative for congestion, sinus pain and sore throat.   Eyes: Negative.   Respiratory: Positive for cough, sputum production and shortness of breath. Negative for hemoptysis and wheezing.   Cardiovascular: Negative for chest pain, palpitations, orthopnea, claudication, leg swelling and PND.  Gastrointestinal: Positive for heartburn. Negative for abdominal pain, nausea and vomiting.  Genitourinary: Negative.   Musculoskeletal: Negative.   Skin: Negative for rash.  Neurological: Negative for dizziness, seizures and headaches.  Endo/Heme/Allergies: Negative.   Psychiatric/Behavioral: Negative.    Objective:   Vitals:   11/15/20 0857  BP: 118/60  Pulse: 83  Temp: (!) 97.5 F (36.4 C)  TempSrc: Oral  SpO2: 98%  Weight: 134 lb 12.8 oz (61.1 kg)  Height: 5\' 3"  (1.6 m)     Physical Exam Constitutional:      General: She is not in acute distress. HENT:      Head: Normocephalic and atraumatic.  Eyes:     General: No scleral icterus.    Conjunctiva/sclera: Conjunctivae normal.  Cardiovascular:     Rate and Rhythm: Normal rate and regular rhythm.     Pulses: Normal pulses.     Heart sounds: Normal heart sounds. No murmur heard.   Pulmonary:     Effort: Pulmonary effort is normal.     Breath sounds: Normal breath sounds. No wheezing, rhonchi or rales.  Abdominal:  General: Bowel sounds are normal.     Palpations: Abdomen is soft.  Musculoskeletal:     Right lower leg: No edema.     Left lower leg: No edema.  Skin:    General: Skin is warm and dry.     Capillary Refill: Capillary refill takes less than 2 seconds.  Neurological:     General: No focal deficit present.     Mental Status: She is alert.  Psychiatric:        Mood and Affect: Mood normal.        Behavior: Behavior normal.        Thought Content: Thought content normal.        Judgment: Judgment normal.     CBC    Component Value Date/Time   WBC 6.5 04/13/2020 1047   WBC 6.6 02/01/2019 0830   RBC 5.31 (H) 04/13/2020 1047   RBC 5.30 (H) 02/01/2019 0830   HGB 14.3 04/13/2020 1047   HCT 44.5 04/13/2020 1047   PLT 375 04/13/2020 1047   MCV 84 04/13/2020 1047   MCH 26.9 04/13/2020 1047   MCH 28.3 10/10/2014 0834   MCHC 32.1 04/13/2020 1047   MCHC 32.8 02/01/2019 0830   RDW 12.7 04/13/2020 1047   LYMPHSABS 1.6 03/08/2018 0932   MONOABS 0.7 03/08/2018 0932   EOSABS 0.2 03/08/2018 0932   BASOSABS 0.0 03/08/2018 0932   BMP Latest Ref Rng & Units 06/12/2020 04/13/2020 03/26/2020  Glucose 70 - 99 mg/dL 103(H) 89 88  BUN 6 - 23 mg/dL 16 15 14   Creatinine 0.40 - 1.20 mg/dL 0.94 0.98 1.02(H)  BUN/Creat Ratio 12 - 28 - 15 14  Sodium 135 - 145 mEq/L 139 141 141  Potassium 3.5 - 5.1 mEq/L 4.3 5.1 5.1  Chloride 96 - 112 mEq/L 102 102 104  CO2 19 - 32 mEq/L 30 24 22   Calcium 8.4 - 10.5 mg/dL 9.7 9.7 9.4   Chest imaging: PET Scan 06/28/20 1. No hypermetabolism to  correspond to the subcarinal mass, which may have decreased slightly in size. Differential considerations include reactive node or complex bronchogenic cyst or GI duplication cyst. As malignant etiologies are not entirely excluded, recommend chest CT follow-up (ideally with contrast) at 3-6 months. 2. Left adrenal adenoma. 3. Focal hypermetabolism within the transverse colon for which polyp or mass cannot be excluded. Correlate with colon cancer screening history and consider colonoscopy if not up-to-date. 4. Incidental findings, including: Aortic atherosclerosis (ICD10-I70.0), coronary artery atherosclerosis and emphysema (ICD10-J43.9).  CT Chest W/ Contrast 06/19/2020 1. Subcarinal 2.8 x 2.1 cm soft tissue density mass is stable since 04/04/2020 coronary CT, with differential including pathologic subcarinal lymph node versus dense bronchopulmonary or esophageal duplication cyst. Mild AP window lymphadenopathy. Recommend further characterization with PET-CT at this time given the advanced smoking related changes in the lungs. 2. Marked centrilobular emphysema with diffuse bronchial wall thickening, suggesting COPD. No lung masses or significant pulmonary nodules. 3. Two-vessel coronary atherosclerosis. 4. Small hiatal hernia. 5. Bilateral adrenal adenomas. 6. Aortic Atherosclerosis (ICD10-I70.0) and Emphysema (ICD10-J43.9).  PFT: PFT Results Latest Ref Rng & Units 08/17/2020  FVC-Pre L 2.28  FVC-Predicted Pre % 84  FVC-Post L 1.89  FVC-Predicted Post % 70  Pre FEV1/FVC % % 64  Post FEV1/FCV % % 67  FEV1-Pre L 1.46  FEV1-Predicted Pre % 72  FEV1-Post L 1.27  DLCO uncorrected ml/min/mmHg 8.53  DLCO UNC% % 46  DLCO corrected ml/min/mmHg 8.53  DLCO COR %Predicted % 46  DLVA Predicted %  63  TLC L 4.75  TLC % Predicted % 96  RV % Predicted % 128   Echo: 10/10/2014 - Left ventricle: The cavity size was normal. Wall thickness was  normal. Systolic function was normal. The  estimated ejection  fraction was in the range of 60% to 65%. Wall motion was normal;  there were no regional wall motion abnormalities.  - Mitral valve: Calcified annulus. There was no significant  regurgitation.  - Right ventricle: The cavity size was normal. Systolic function  was normal.  - Impressions: No cardiac source of embolism was identified, but  cannot be ruled out on the basis of this examination.  Heart Catheterization: 04/17/20 1.  Nonobstructive coronary artery disease with mild nonobstructive stenosis of the proximal to mid LAD and moderate nonobstructive stenosis of the mid left circumflex, confirmed to be nonobstructive by invasive RFR evaluation 2.  Normal LVEDP  Assessment & Plan:   Chronic obstructive pulmonary disease, unspecified COPD type ([place])  Pulmonary emphysema, unspecified emphysema type ([place])  Mediastinal mass - Plan: CT Chest W Contrast  Chronic cough  Discussion: Wendy Patrick is a 74 year old woman, former smoker with COPD/Emphysema, subcarinal mass, chronic rhinitis, post-nasal drip and chronic cough who returns to pulmonary clinic for follow up   Her appears to be an issues related to her ongoing post-nasal drip. Her symptoms are improved with flonase, azelastine and loratidine. We will try her on ipratropium nasal spray at night before bedtime and monitor for improvement. She can also try using the nasal spray twice daily for symptoms.   We will empirically treat for reflux with 40mg  pantroprazole daily. She is to also make sure the head of her bed is elevated properly with blocks.   She is to continue anoro ellipta and as needed albuterol for her COPD and shortness of breath.   We discussed ordering a home sleep study to evaluate for obstructive sleep apnea given her progressive fatigue, but does not want to pursue further testing at this time.   We will repeat a CT Chest with contrast next month as a 6 month follow up from her PET scan  for the subcarinal mass.   Follow up in 4 months  Freda Jackson, MD [facility] Office: [phone]   See Amion for Pager Details    Current Outpatient Medications:  .  albuterol (VENTOLIN HFA) 108 (90 Base) MCG/ACT inhaler, Inhale 2 puffs into the lungs every 4 (four) hours as needed for wheezing or shortness of breath., Disp: 18 g, Rfl: 11 .  aspirin EC 81 MG tablet, Take 81 mg by mouth daily., Disp: , Rfl:  .  azelastine (ASTELIN) 0.1 % nasal spray, Place 2 sprays into both nostrils 2 (two) times daily. Use in each nostril as directed, Disp: 30 mL, Rfl: 12 .  cholecalciferol (VITAMIN D) 1000 units tablet, Take 10,000 Units by mouth daily. , Disp: , Rfl:  .  fluticasone (FLONASE) 50 MCG/ACT nasal spray, Place 2 sprays into both nostrils daily., Disp: 16 g, Rfl: 11 .  KRILL OIL PO, Take 250 mg by mouth daily., Disp: , Rfl:  .  Loratadine 10 MG CAPS, Take 10 mg by mouth daily., Disp: , Rfl:  .  methocarbamol (ROBAXIN) 500 MG tablet, Take 1 tablet (500 mg total) by mouth 3 (three) times daily., Disp: 90 tablet, Rfl: 5 .  umeclidinium-vilanterol (ANORO ELLIPTA) 62.5-25 MCG/INH AEPB, Inhale 1 puff into the lungs daily., Disp: 1 each, Rfl: 11 .  ipratropium (  ATROVENT) 0.03 % nasal spray, Place 2 sprays into both nostrils every 12 (twelve) hours., Disp: 30 mL, Rfl: 12 .  pantoprazole (PROTONIX) 40 MG tablet, Take 1 tablet (40 mg total) by mouth daily., Disp: 30 tablet, Rfl: 3  Current Facility-Administered Medications:  .  0.9 %  sodium chloride infusion, 500 mL, Intravenous, Once, Nandigam, Venia Minks, MD

## 2020-11-15 NOTE — Patient Instructions (Addendum)
Start pantoprazole 40mg  daily (30 minutes before eating breakfast) - Give [place] a call in 1 month to let us know how the cough is doing.  Start ipratropium nasal spray, 1 spray per nostril before bedtime. Can start using it twice daily if needed.   We will schedule you for CT Chest scan with contrast in January 2022.

## 2020-11-27 ENCOUNTER — Other Ambulatory Visit (INDEPENDENT_AMBULATORY_CARE_PROVIDER_SITE_OTHER): Payer: Medicare HMO

## 2020-11-27 DIAGNOSIS — Z5181 Encounter for therapeutic drug level monitoring: Secondary | ICD-10-CM

## 2020-11-27 LAB — BASIC METABOLIC PANEL
BUN: 11 mg/dL (ref 6–23)
CO2: 29 mEq/L (ref 19–32)
Calcium: 9.6 mg/dL (ref 8.4–10.5)
Chloride: 102 mEq/L (ref 96–112)
Creatinine, Ser: 1.2 mg/dL (ref 0.40–1.20)
GFR: 44.05 mL/min — ABNORMAL LOW (ref >60.00)
Glucose, Bld: 92 mg/dL (ref 70–99)
Potassium: 4.1 mEq/L (ref 3.5–5.1)
Sodium: 139 mEq/L (ref 135–145)

## 2020-12-04 ENCOUNTER — Ambulatory Visit (INDEPENDENT_AMBULATORY_CARE_PROVIDER_SITE_OTHER)
Admission: RE | Admit: 2020-12-04 | Discharge: 2020-12-04 | Disposition: A | Discharge status: Home / Self Care | Payer: Medicare HMO | Source: Ambulatory Visit | Attending: Pulmonary Disease | Admitting: Pulmonary Disease

## 2020-12-04 ENCOUNTER — Other Ambulatory Visit: Payer: Self-pay

## 2020-12-04 DIAGNOSIS — Q331 Accessory lobe of lung: Secondary | ICD-10-CM | POA: Diagnosis not present

## 2020-12-04 DIAGNOSIS — I700 Atherosclerosis of aorta: Secondary | ICD-10-CM | POA: Diagnosis not present

## 2020-12-04 DIAGNOSIS — I2510 Atherosclerotic heart disease of native coronary artery without angina pectoris: Secondary | ICD-10-CM | POA: Diagnosis not present

## 2020-12-04 DIAGNOSIS — J9859 Other diseases of mediastinum, not elsewhere classified: Secondary | ICD-10-CM

## 2020-12-04 DIAGNOSIS — J432 Centrilobular emphysema: Secondary | ICD-10-CM | POA: Diagnosis not present

## 2020-12-04 MED ORDER — IOHEXOL 300 MG/ML  SOLN
80.0000 mL | Freq: Once | INTRAMUSCULAR | Status: AC | PRN
  Administered 2020-12-04: 80 mL via INTRAVENOUS

## 2020-12-05 ENCOUNTER — Inpatient Hospital Stay: Admission: RE | Admit: 2020-12-05 | Payer: Medicare HMO | Source: Ambulatory Visit

## 2020-12-19 ENCOUNTER — Other Ambulatory Visit: Payer: Self-pay | Admitting: *Deleted

## 2020-12-19 MED ORDER — LAMOTRIGINE 25 MG PO TABS: ORAL_TABLET | ORAL | 5 refills | Status: DC

## 2021-01-15 ENCOUNTER — Other Ambulatory Visit: Payer: Self-pay | Admitting: *Deleted

## 2021-01-15 ENCOUNTER — Telehealth: Payer: Self-pay | Admitting: Neurology

## 2021-01-15 MED ORDER — METHOCARBAMOL 500 MG PO TABS: 500.0000 mg | ORAL_TABLET | Freq: Three times a day (TID) | ORAL | 1 refills | Status: DC

## 2021-01-15 NOTE — Telephone Encounter (Signed)
Called pt back to further discuss. She is having severe pain near shoulders in middle of back. This started yesterday and is worse today. Prior to this, she had pain in lower part of back. Could not move around without severe pain. Confirmed she is taking lamotrigine 25mg , 2 tablets twice daily. Started this dose on 01/11/21. She does have rash on stomach near bra line that has been present for the past three days.   She is also having bad headaches. This started 2-3 days ago. Pain located all over head, throbbing. Does not have headache today. Intermittent. Advised I will discuss with MD and call her back with his recommendation.

## 2021-01-15 NOTE — Telephone Encounter (Signed)
She was written for Robaxin when I last saw her in 2021.  If she has not tried it but still has pills in the house, she can use Robaxin up to 3 times a day.    If she has tried this and it has not helped,  We can call in Flexeril 5 mg p.o. 3 times daily as needed #91 refill

## 2021-01-15 NOTE — Telephone Encounter (Signed)
Pt asking for a call back from nurse, she is experiencing bad back aches and headaches and seems distressed.

## 2021-01-15 NOTE — Telephone Encounter (Addendum)
Called pt. Relayed Dr. Garth Bigness message. She is agreeable to stop lamotrigine and try robaxin again. I e-scribed new rx since she had none on hand.  Pt did report she has colonoscopy that found pre cancerous nodules that were removed. She will go back for f/u soon for this. She will update [place] if there are any changes.

## 2021-01-15 NOTE — Telephone Encounter (Signed)
Spoke with Dr. Felecia Shelling about her reported rash, he would like her to stop lamotrigine

## 2021-02-04 ENCOUNTER — Telehealth: Payer: Self-pay

## 2021-02-04 NOTE — Telephone Encounter (Signed)
-----   Message from Hughie Closs, RN sent at 09/18/2020  3:38 PM EDT ----- Needs 6 month F/U office visit = 03/19/21

## 2021-02-04 NOTE — Telephone Encounter (Signed)
Patient is having anorectal surgery with [facility] on 02/14/21. Scheduled office visit with Dr. Silverio Decamp on 03/08/21. (6 month F/U from abnormal colonoscopy)

## 2021-02-04 NOTE — Telephone Encounter (Signed)
Left message to please call back. °

## 2021-02-05 ENCOUNTER — Ambulatory Visit: Payer: Self-pay | Admitting: General Surgery

## 2021-02-05 NOTE — H&P (Signed)
The patient is a 75 year old female who presents with a complaint of anal problems. 75 year old female who underwent a colonoscopy recently for screening purposes. A few small lesions were noted in the anal canal. 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 She is scheduled to follow-up with a pulmonologist to discuss the plan of action for this in the next couple weeks.   Past Surgical History  Cataract Surgery  Bilateral. 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 Azelastine HCl (0.1% Solution, Nasal) Active. Fluticasone Propionate (50MCG/ACT Suspension, Nasal) Active. Loratadine (10MG  Capsule, Oral) Active. Medications Reconciled  Social History  Caffeine use  Tea. Tobacco use  Former smoker.  Family History  Arthritis  Son. Breast Cancer  Mother. Heart Disease  Brother.  Pregnancy / Birth History  Age at menarche  16 years. Contraceptive History  Intrauterine device. Gravida  2 Maternal age  <15 Para  2  Other Problems Bladder Problems  Cancer  Chest pain  Chronic Obstructive Lung Disease  Gastric Ulcer  Hemorrhoids  Hypercholesterolemia  Kidney Stone  Migraine Headache  Oophorectomy  Right. Other disease, cancer, significant illness  Vascular Disease     Review of Systems  General Present- Fatigue. Not Present- Appetite Loss, Chills, Fever, Night Sweats, Weight Gain and Weight Loss. Skin Present- Dryness. Not Present- Change in  Wart/Mole, Hives, Jaundice, New Lesions, Non-Healing Wounds, Rash and Ulcer. HEENT Present- Hearing Loss, Hoarseness, Ringing in the Ears, Seasonal Allergies, Sinus Pain and Visual Disturbances. Not Present- Earache, Nose Bleed, Oral Ulcers, Sore Throat, Wears glasses/contact lenses and Yellow Eyes. Respiratory Present- Chronic Cough, Difficulty Breathing and Snoring. Not Present- Bloody sputum and Wheezing. Breast Not Present- Breast Mass, Breast Pain, Nipple Discharge and Skin Changes. Cardiovascular Present- Chest Pain and Shortness of Breath. Not Present- Difficulty Breathing Lying Down, Leg Cramps, Palpitations, Rapid Heart Rate and Swelling of Extremities. Gastrointestinal Present- Constipation and Difficulty Swallowing. Not Present- Abdominal Pain, Bloating, Bloody Stool, Change in Bowel Habits, Chronic diarrhea, Excessive gas, Gets full quickly at meals, Hemorrhoids, Indigestion, Nausea, Rectal Pain and Vomiting. Female Genitourinary Present- Nocturia and Urgency. Not Present- Frequency, Painful Urination and Pelvic Pain. Musculoskeletal Present- Back Pain, Joint Stiffness and Muscle Pain. Not Present- Joint Pain, Muscle Weakness and Swelling of Extremities. Neurological Present- Decreased Memory, Headaches and Weakness. Not Present- Fainting, Numbness, Seizures, Tingling, Tremor and Trouble walking. Psychiatric Not Present- Anxiety, Bipolar, Change in Sleep Pattern, Depression, Fearful and Frequent crying. Endocrine Present- Cold Intolerance, Hair Changes and Heat Intolerance. Not Present- Excessive Hunger, Hot flashes and New Diabetes. Hematology Present- Easy Bruising. Not Present- Blood Thinners, Excessive bleeding, Gland problems, HIV and Persistent Infections.  Vitals  10/22/2020 10:20 AM Weight: 134.25 lb Height: 64in Body Surface Area: 1.65 m Body Mass Index: 23.04 kg/m  Temp.: 97.90F  Pulse: 94 (Regular)  BP: 124/82(Sitting, Left Arm, Standard)       Physical  Exam  General Mental Status-Alert. General Appearance-Cooperative.  Abdomen Palpation/Percussion Palpation and Percussion of the abdomen reveal - Soft and Non Tender.    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 The largest one was biopsied and pathology shows AIN grade 1. I have recommended anal exam under anesthesia with excision of anal lesions. We have discussed this in detail. All questions were answered. She is undergoing workup for a lung nodule, I would like to get this figured out prior to scheduling anal surgery. We will plan on calling her in January to get this scheduled. Current Plans

## 2021-02-05 NOTE — H&P (View-Only) (Signed)
The patient is a 75 year old female who presents with a complaint of anal problems. 75 year old female who underwent a colonoscopy recently for screening purposes. A few small lesions were noted in the anal canal. 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 She is scheduled to follow-up with a pulmonologist to discuss the plan of action for this in the next couple weeks.   Past Surgical History  Cataract Surgery  Bilateral. 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 Azelastine HCl (0.1% Solution, Nasal) Active. Fluticasone Propionate (50MCG/ACT Suspension, Nasal) Active. Loratadine (10MG  Capsule, Oral) Active. Medications Reconciled  Social History  Caffeine use  Tea. Tobacco use  Former smoker.  Family History  Arthritis  Son. Breast Cancer  Mother. Heart Disease  Brother.  Pregnancy / Birth History  Age at menarche  73 years. Contraceptive History  Intrauterine device. Gravida  2 Maternal age  <15 Para  2  Other Problems Bladder Problems  Cancer  Chest pain  Chronic Obstructive Lung Disease  Gastric Ulcer  Hemorrhoids  Hypercholesterolemia  Kidney Stone  Migraine Headache  Oophorectomy  Right. Other disease, cancer, significant illness  Vascular Disease     Review of Systems  General Present- Fatigue. Not Present- Appetite Loss, Chills, Fever, Night Sweats, Weight Gain and Weight Loss. Skin Present- Dryness. Not Present- Change in  Wart/Mole, Hives, Jaundice, New Lesions, Non-Healing Wounds, Rash and Ulcer. HEENT Present- Hearing Loss, Hoarseness, Ringing in the Ears, Seasonal Allergies, Sinus Pain and Visual Disturbances. Not Present- Earache, Nose Bleed, Oral Ulcers, Sore Throat, Wears glasses/contact lenses and Yellow Eyes. Respiratory Present- Chronic Cough, Difficulty Breathing and Snoring. Not Present- Bloody sputum and Wheezing. Breast Not Present- Breast Mass, Breast Pain, Nipple Discharge and Skin Changes. Cardiovascular Present- Chest Pain and Shortness of Breath. Not Present- Difficulty Breathing Lying Down, Leg Cramps, Palpitations, Rapid Heart Rate and Swelling of Extremities. Gastrointestinal Present- Constipation and Difficulty Swallowing. Not Present- Abdominal Pain, Bloating, Bloody Stool, Change in Bowel Habits, Chronic diarrhea, Excessive gas, Gets full quickly at meals, Hemorrhoids, Indigestion, Nausea, Rectal Pain and Vomiting. Female Genitourinary Present- Nocturia and Urgency. Not Present- Frequency, Painful Urination and Pelvic Pain. Musculoskeletal Present- Back Pain, Joint Stiffness and Muscle Pain. Not Present- Joint Pain, Muscle Weakness and Swelling of Extremities. Neurological Present- Decreased Memory, Headaches and Weakness. Not Present- Fainting, Numbness, Seizures, Tingling, Tremor and Trouble walking. Psychiatric Not Present- Anxiety, Bipolar, Change in Sleep Pattern, Depression, Fearful and Frequent crying. Endocrine Present- Cold Intolerance, Hair Changes and Heat Intolerance. Not Present- Excessive Hunger, Hot flashes and New Diabetes. Hematology Present- Easy Bruising. Not Present- Blood Thinners, Excessive bleeding, Gland problems, HIV and Persistent Infections.  Vitals  10/22/2020 10:20 AM Weight: 134.25 lb Height: 64in Body Surface Area: 1.65 m Body Mass Index: 23.04 kg/m  Temp.: 97.27F  Pulse: 94 (Regular)  BP: 124/82(Sitting, Left Arm, Standard)       Physical  Exam  General Mental Status-Alert. General Appearance-Cooperative.  Abdomen Palpation/Percussion Palpation and Percussion of the abdomen reveal - Soft and Non Tender.    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 The largest one was biopsied and pathology shows AIN grade 1. I have recommended anal exam under anesthesia with excision of anal lesions. We have discussed this in detail. All questions were answered. She is undergoing workup for a lung nodule, I would like to get this figured out prior to scheduling anal surgery. We will plan on calling her in January to get this scheduled. Current Plans

## 2021-02-11 ENCOUNTER — Encounter (HOSPITAL_BASED_OUTPATIENT_CLINIC_OR_DEPARTMENT_OTHER): Payer: Self-pay | Admitting: General Surgery

## 2021-02-11 ENCOUNTER — Other Ambulatory Visit (HOSPITAL_COMMUNITY)
Admission: RE | Admit: 2021-02-11 | Discharge: 2021-02-11 | Disposition: A | Discharge status: Home / Self Care | Payer: Medicare HMO | Source: Ambulatory Visit | Attending: General Surgery | Admitting: General Surgery

## 2021-02-11 ENCOUNTER — Other Ambulatory Visit: Payer: Self-pay

## 2021-02-11 DIAGNOSIS — Z20822 Contact with and (suspected) exposure to covid-19: Secondary | ICD-10-CM | POA: Insufficient documentation

## 2021-02-11 DIAGNOSIS — Z01812 Encounter for preprocedural laboratory examination: Secondary | ICD-10-CM | POA: Insufficient documentation

## 2021-02-11 LAB — SARS CORONAVIRUS 2 (TAT 6-24 HRS): SARS Coronavirus 2: NEGATIVE

## 2021-02-11 NOTE — Progress Notes (Addendum)
Spoke w/ via phone for pre-op interview---pt Lab needs dos----     I stat          Lab results------see below COVID test ------02-11-2021 830 Arrive at -------730 am 02-14-2021 NPO after MN NO Solid Food.  Clear liquids from MN until---630 am then npo Med rec completed Medications to take morning of surgery -----albuterol inhaler prn/bring inhaler, astelin nasal spray, atrovent, fluticasone, anoro ellipta inhaler, pantaprazole, loratadine Diabetic medication ----- Patient instructed to bring photo id and insurance card day of surgery Patient aware to have Driver (ride ) / caregiver    for 24 hours after surgery  Patient Special Instructions ----- Pre-Op special Istructions ----- Patient verbalized understanding of instructions that were given at this phone interview. Patient denies shortness of breath, chest pain, fever, cough at this phone interview.  Anesthesia Review:hx copd cad, pt stated no cardiac S & S at pre op call, pt does own housework, sob with heavy exertion  PCP:  Dr Benjamine Mola crawford Cardiologist : dr Clayton Lefort 08-16-2020 epic Ct coronary 04-04-2020 epic Chest ct 06-19-2020 EKG : 08-16-2020 epic Echo : 10-10-2014 epic Stress test:05-04-2017 epic Cardiac Cath : last done 04-17-2020 epic Activity level: does own housework and errands can slowly climb steps Sleep Study/ CPAP :none Fasting Blood Sugar :      / Checks Blood Sugar -- times a day:  n/a Blood Thinner/ Instructions /Last Dose:n/a ASA / Instructions/ Last Dose : pt takes 81 mg aspirin 2 x week, will not take 81 mg aspirin day of surgery

## 2021-02-14 ENCOUNTER — Encounter (HOSPITAL_BASED_OUTPATIENT_CLINIC_OR_DEPARTMENT_OTHER): Payer: Self-pay | Admitting: General Surgery

## 2021-02-14 ENCOUNTER — Other Ambulatory Visit: Payer: Self-pay

## 2021-02-14 ENCOUNTER — Ambulatory Visit (HOSPITAL_BASED_OUTPATIENT_CLINIC_OR_DEPARTMENT_OTHER): Payer: Medicare HMO | Admitting: Anesthesiology

## 2021-02-14 ENCOUNTER — Encounter (HOSPITAL_BASED_OUTPATIENT_CLINIC_OR_DEPARTMENT_OTHER)
Admission: RE | Disposition: A | Discharge status: Home / Self Care | Payer: Self-pay | Source: Home / Self Care | Attending: General Surgery

## 2021-02-14 ENCOUNTER — Ambulatory Visit (HOSPITAL_BASED_OUTPATIENT_CLINIC_OR_DEPARTMENT_OTHER)
Admission: RE | Admit: 2021-02-14 | Discharge: 2021-02-14 | Disposition: A | Discharge status: Home / Self Care | Payer: Medicare HMO | Attending: General Surgery | Admitting: General Surgery

## 2021-02-14 DIAGNOSIS — Z888 Allergy status to other drugs, medicaments and biological substances status: Secondary | ICD-10-CM | POA: Insufficient documentation

## 2021-02-14 DIAGNOSIS — K449 Diaphragmatic hernia without obstruction or gangrene: Secondary | ICD-10-CM | POA: Diagnosis not present

## 2021-02-14 DIAGNOSIS — Z91040 Latex allergy status: Secondary | ICD-10-CM | POA: Insufficient documentation

## 2021-02-14 DIAGNOSIS — K6282 Dysplasia of anus: Secondary | ICD-10-CM | POA: Diagnosis not present

## 2021-02-14 DIAGNOSIS — Z8249 Family history of ischemic heart disease and other diseases of the circulatory system: Secondary | ICD-10-CM | POA: Diagnosis not present

## 2021-02-14 DIAGNOSIS — Z79899 Other long term (current) drug therapy: Secondary | ICD-10-CM | POA: Insufficient documentation

## 2021-02-14 DIAGNOSIS — Z803 Family history of malignant neoplasm of breast: Secondary | ICD-10-CM | POA: Diagnosis not present

## 2021-02-14 DIAGNOSIS — D013 Carcinoma in situ of anus and anal canal: Secondary | ICD-10-CM | POA: Insufficient documentation

## 2021-02-14 DIAGNOSIS — R229 Localized swelling, mass and lump, unspecified: Secondary | ICD-10-CM | POA: Diagnosis not present

## 2021-02-14 DIAGNOSIS — J449 Chronic obstructive pulmonary disease, unspecified: Secondary | ICD-10-CM | POA: Diagnosis not present

## 2021-02-14 DIAGNOSIS — E7800 Pure hypercholesterolemia, unspecified: Secondary | ICD-10-CM | POA: Diagnosis not present

## 2021-02-14 DIAGNOSIS — Z87891 Personal history of nicotine dependence: Secondary | ICD-10-CM | POA: Insufficient documentation

## 2021-02-14 HISTORY — PX: RECTAL EXAM UNDER ANESTHESIA: SHX6399

## 2021-02-14 HISTORY — DX: Other specified disorders of gingiva and edentulous alveolar ridge: K06.8

## 2021-02-14 HISTORY — DX: Personal history of other diseases of the nervous system and sense organs: Z86.69

## 2021-02-14 HISTORY — PX: EXCISION OF SKIN TAG: SHX6270

## 2021-02-14 HISTORY — DX: Gastro-esophageal reflux disease without esophagitis: K21.9

## 2021-02-14 HISTORY — DX: Urgency of urination: R39.15

## 2021-02-14 HISTORY — DX: Dysplasia of anus: K62.82

## 2021-02-14 HISTORY — DX: Personal history of gestational diabetes: Z86.32

## 2021-02-14 HISTORY — DX: Dyspnea, unspecified: R06.00

## 2021-02-14 HISTORY — DX: Trigeminal neuralgia: G50.0

## 2021-02-14 HISTORY — DX: Headache, unspecified: R51.9

## 2021-02-14 HISTORY — DX: Personal history of other diseases of the digestive system: Z87.19

## 2021-02-14 HISTORY — DX: Family history of other specified conditions: Z84.89

## 2021-02-14 LAB — POCT I-STAT, CHEM 8
BUN: 22 mg/dL (ref 8–23)
Calcium, Ion: 1.24 mmol/L (ref 1.15–1.40)
Chloride: 104 mmol/L (ref 98–111)
Creatinine, Ser: 0.9 mg/dL (ref 0.44–1.00)
Glucose, Bld: 105 mg/dL — ABNORMAL HIGH (ref 70–99)
HCT: 46 % (ref 36.0–46.0)
Hemoglobin: 15.6 g/dL — ABNORMAL HIGH (ref 12.0–15.0)
Potassium: 4.8 mmol/L (ref 3.5–5.1)
Sodium: 141 mmol/L (ref 135–145)
TCO2: 29 mmol/L (ref 22–32)

## 2021-02-14 SURGERY — EXCISION, SKIN TAG
Anesthesia: Monitor Anesthesia Care | Site: Anus

## 2021-02-14 MED ORDER — ACETAMINOPHEN 500 MG PO TABS
ORAL_TABLET | ORAL | Status: AC
  Filled 2021-02-14: qty 2

## 2021-02-14 MED ORDER — ACETAMINOPHEN 10 MG/ML IV SOLN: 1000.0000 mg | Freq: Once | INTRAVENOUS | Status: DC | PRN

## 2021-02-14 MED ORDER — PROPOFOL 500 MG/50ML IV EMUL
INTRAVENOUS | Status: AC
  Filled 2021-02-14: qty 50

## 2021-02-14 MED ORDER — FENTANYL CITRATE (PF) 250 MCG/5ML IJ SOLN
INTRAMUSCULAR | Status: DC | PRN
  Administered 2021-02-14: 50 ug via INTRAVENOUS

## 2021-02-14 MED ORDER — KETAMINE HCL 50 MG/5ML IJ SOSY
PREFILLED_SYRINGE | INTRAMUSCULAR | Status: AC
  Filled 2021-02-14: qty 5

## 2021-02-14 MED ORDER — DEXMEDETOMIDINE (PRECEDEX) IN NS 20 MCG/5ML (4 MCG/ML) IV SYRINGE
PREFILLED_SYRINGE | INTRAVENOUS | Status: DC | PRN
  Administered 2021-02-14 (×3): 4 ug via INTRAVENOUS

## 2021-02-14 MED ORDER — FENTANYL CITRATE (PF) 100 MCG/2ML IJ SOLN: 25.0000 ug | INTRAMUSCULAR | Status: DC | PRN

## 2021-02-14 MED ORDER — SODIUM CHLORIDE 0.9% FLUSH: 3.0000 mL | Freq: Two times a day (BID) | INTRAVENOUS | Status: DC

## 2021-02-14 MED ORDER — BUPIVACAINE-EPINEPHRINE 0.5% -1:200000 IJ SOLN
INTRAMUSCULAR | Status: DC | PRN
  Administered 2021-02-14: 25 mL

## 2021-02-14 MED ORDER — AMISULPRIDE (ANTIEMETIC) 5 MG/2ML IV SOLN: 10.0000 mg | Freq: Once | INTRAVENOUS | Status: DC | PRN

## 2021-02-14 MED ORDER — ACETAMINOPHEN 500 MG PO TABS
1000.0000 mg | ORAL_TABLET | ORAL | Status: AC
  Administered 2021-02-14: 1000 mg via ORAL

## 2021-02-14 MED ORDER — LACTATED RINGERS IV SOLN: INTRAVENOUS | Status: DC

## 2021-02-14 MED ORDER — 0.9 % SODIUM CHLORIDE (POUR BTL) OPTIME
TOPICAL | Status: DC | PRN
  Administered 2021-02-14: 500 mL

## 2021-02-14 MED ORDER — DEXMEDETOMIDINE (PRECEDEX) IN NS 20 MCG/5ML (4 MCG/ML) IV SYRINGE
PREFILLED_SYRINGE | INTRAVENOUS | Status: AC
  Filled 2021-02-14: qty 5

## 2021-02-14 MED ORDER — ONDANSETRON HCL 4 MG/2ML IJ SOLN
INTRAMUSCULAR | Status: DC | PRN
  Administered 2021-02-14: 4 mg via INTRAVENOUS

## 2021-02-14 MED ORDER — ACETIC ACID 5 % SOLN
Status: DC | PRN
  Administered 2021-02-14: 1 via TOPICAL

## 2021-02-14 MED ORDER — FENTANYL CITRATE (PF) 100 MCG/2ML IJ SOLN
INTRAMUSCULAR | Status: AC
  Filled 2021-02-14: qty 2

## 2021-02-14 MED ORDER — ONDANSETRON HCL 4 MG/2ML IJ SOLN
INTRAMUSCULAR | Status: AC
  Filled 2021-02-14: qty 2

## 2021-02-14 MED ORDER — MIDAZOLAM HCL 2 MG/2ML IJ SOLN
INTRAMUSCULAR | Status: AC
  Filled 2021-02-14: qty 2

## 2021-02-14 MED ORDER — ONDANSETRON HCL 4 MG/2ML IJ SOLN: 4.0000 mg | Freq: Once | INTRAMUSCULAR | Status: DC | PRN

## 2021-02-14 MED ORDER — TRAMADOL HCL 50 MG PO TABS: 50.0000 mg | ORAL_TABLET | Freq: Four times a day (QID) | ORAL | 0 refills | Status: DC | PRN

## 2021-02-14 MED ORDER — PROPOFOL 10 MG/ML IV BOLUS
INTRAVENOUS | Status: DC | PRN
  Administered 2021-02-14: 30 mg via INTRAVENOUS
  Administered 2021-02-14: 150 ug/kg/min via INTRAVENOUS

## 2021-02-14 SURGICAL SUPPLY — 54 items
BENZOIN TINCTURE PRP APPL 2/3 (GAUZE/BANDAGES/DRESSINGS) ×4 IMPLANT
BLADE EXTENDED COATED 6.5IN (ELECTRODE) IMPLANT
BLADE HEX COATED 2.75 (ELECTRODE) ×2 IMPLANT
BLADE SURG 10 STRL SS (BLADE) IMPLANT
BRIEF STRETCH FOR OB PAD LRG (UNDERPADS AND DIAPERS) ×2 IMPLANT
CANISTER SUCT 3000ML PPV (MISCELLANEOUS) ×2 IMPLANT
COVER BACK TABLE 60X90IN (DRAPES) ×2 IMPLANT
COVER MAYO STAND STRL (DRAPES) ×2 IMPLANT
COVER WAND RF STERILE (DRAPES) ×2 IMPLANT
DECANTER SPIKE VIAL GLASS SM (MISCELLANEOUS) ×2 IMPLANT
DRAPE HYSTEROSCOPY (MISCELLANEOUS) IMPLANT
DRAPE LAPAROTOMY 100X72 PEDS (DRAPES) ×2 IMPLANT
DRAPE SHEET LG 3/4 BI-LAMINATE (DRAPES) IMPLANT
DRAPE UTILITY XL STRL (DRAPES) ×2 IMPLANT
DRSG PAD ABDOMINAL 8X10 ST (GAUZE/BANDAGES/DRESSINGS) ×2 IMPLANT
ELECT REM PT RETURN 9FT ADLT (ELECTROSURGICAL) ×2
ELECTRODE REM PT RTRN 9FT ADLT (ELECTROSURGICAL) ×1 IMPLANT
GAUZE SPONGE 4X4 12PLY STRL (GAUZE/BANDAGES/DRESSINGS) ×2 IMPLANT
GLOVE SURG ENC MOIS LTX SZ6.5 (GLOVE) ×2 IMPLANT
GLOVE SURG UNDER POLY LF SZ7 (GLOVE) ×2 IMPLANT
GOWN STRL REUS W/TWL XL LVL3 (GOWN DISPOSABLE) ×2 IMPLANT
HYDROGEN PEROXIDE 16OZ (MISCELLANEOUS) ×2 IMPLANT
IV CATH 14GX2 1/4 (CATHETERS) ×2 IMPLANT
IV CATH 18G SAFETY (IV SOLUTION) ×2 IMPLANT
KIT SIGMOIDOSCOPE (SET/KITS/TRAYS/PACK) IMPLANT
KIT TURNOVER CYSTO (KITS) ×2 IMPLANT
LEGGING LITHOTOMY PAIR STRL (DRAPES) IMPLANT
LOOP VESSEL MAXI BLUE (MISCELLANEOUS) IMPLANT
NEEDLE HYPO 22GX1.5 SAFETY (NEEDLE) ×2 IMPLANT
NS IRRIG 500ML POUR BTL (IV SOLUTION) ×2 IMPLANT
PACK BASIN DAY SURGERY FS (CUSTOM PROCEDURE TRAY) ×2 IMPLANT
PAD ARMBOARD 7.5X6 YLW CONV (MISCELLANEOUS) ×2 IMPLANT
PAD PREP 24X48 CUFFED NSTRL (MISCELLANEOUS) IMPLANT
PENCIL SMOKE EVACUATOR (MISCELLANEOUS) ×2 IMPLANT
SPONGE HEMORRHOID 8X3CM (HEMOSTASIS) IMPLANT
SPONGE SURGIFOAM ABS GEL 100 (HEMOSTASIS) IMPLANT
SPONGE SURGIFOAM ABS GEL 12-7 (HEMOSTASIS) IMPLANT
SUCTION FRAZIER HANDLE 10FR (MISCELLANEOUS)
SUCTION TUBE FRAZIER 10FR DISP (MISCELLANEOUS) IMPLANT
SUT CHROMIC 2 0 SH (SUTURE) IMPLANT
SUT CHROMIC 3 0 SH 27 (SUTURE) ×4 IMPLANT
SUT ETHIBOND 0 (SUTURE) IMPLANT
SUT VIC AB 2-0 SH 27 (SUTURE)
SUT VIC AB 2-0 SH 27XBRD (SUTURE) IMPLANT
SUT VIC AB 3-0 SH 18 (SUTURE) IMPLANT
SUT VIC AB 3-0 SH 27 (SUTURE)
SUT VIC AB 3-0 SH 27XBRD (SUTURE) IMPLANT
SYR 10ML LL (SYRINGE) IMPLANT
SYR CONTROL 10ML LL (SYRINGE) ×4 IMPLANT
TOWEL OR 17X26 10 PK STRL BLUE (TOWEL DISPOSABLE) ×2 IMPLANT
TRAY DSU PREP LF (CUSTOM PROCEDURE TRAY) ×2 IMPLANT
TUBE CONNECTING 12X1/4 (SUCTIONS) ×2 IMPLANT
UNDERPAD 30X36 HEAVY ABSORB (UNDERPADS AND DIAPERS) ×2 IMPLANT
YANKAUER SUCT BULB TIP NO VENT (SUCTIONS) ×2 IMPLANT

## 2021-02-14 NOTE — Anesthesia Preprocedure Evaluation (Addendum)
Anesthesia Evaluation  Patient identified by MRN, date of birth, ID band Patient awake    Reviewed: Allergy & Precautions, NPO status , Patient's Chart, lab work & pertinent test results  Airway Mallampati: II  TM Distance: >3 FB Neck ROM: Full    Dental no notable dental hx.    Pulmonary shortness of breath, with exertion and at rest, COPD,  COPD inhaler, former smoker,   Decreased chest excursion  breath sounds clear to auscultation + decreased breath sounds      Cardiovascular Exercise Tolerance: Good + CAD  Normal cardiovascular exam Rhythm:Regular Rate:Normal     Neuro/Psych  Headaches,  Neuromuscular disease (trigeminal neuralgia) negative psych ROS   GI/Hepatic Neg liver ROS, hiatal hernia, GERD  ,  Endo/Other  negative endocrine ROS  Renal/GU negative Renal ROS  negative genitourinary   Musculoskeletal negative musculoskeletal ROS (+)   Abdominal   Peds negative pediatric ROS (+)  Hematology negative hematology ROS (+)   Anesthesia Other Findings   Reproductive/Obstetrics negative OB ROS                            Anesthesia Physical Anesthesia Plan  ASA: III  Anesthesia Plan: MAC   Post-op Pain Management:    Induction: Intravenous  PONV Risk Score and Plan: TIVA, Propofol infusion and Treatment may vary due to age or medical condition  Airway Management Planned: Natural Airway and Simple Face Mask  Additional Equipment: None  Intra-op Plan:   Post-operative Plan:   Informed Consent: I have reviewed the patients History and Physical, chart, labs and discussed the procedure including the risks, benefits and alternatives for the proposed anesthesia with the patient or authorized representative who has indicated his/her understanding and acceptance.       Plan Discussed with: CRNA, Anesthesiologist and Surgeon  Anesthesia Plan Comments: (Patient states she has  baseline shortness of breath that has worsened over the years with her COPD. Feels like her current shortness of breath is her "normal" this AM. )       Anesthesia Quick Evaluation

## 2021-02-14 NOTE — Op Note (Signed)
02/14/2021  9:45 AM  PATIENT:  Wendy Patrick  75 y.o. female  Patient Care Team: Hoyt Koch, MD as PCP - General (Internal Medicine) Debara Pickett Nadean Corwin, MD as PCP - Cardiology (Cardiology) Tanda Rockers, MD as Consulting Physician (Pulmonary Disease) Warden Fillers, MD as Consulting Physician (Ophthalmology) Pieter Partridge, DO as Consulting Physician (Neurology) Jerrell Belfast, MD as Consulting Physician (Otolaryngology) Debara Pickett Nadean Corwin, MD as Consulting Physician (Cardiology) Clarene Essex, MD as Consulting Physician (Gastroenterology)  PRE-OPERATIVE DIAGNOSIS:  AIN I  POST-OPERATIVE DIAGNOSIS:  AIN I  PROCEDURE:   EXCISION OF ANAL LESIONS ANAL EXAM UNDER ANESTHESIA   Surgeon(s): Leighton Ruff, MD  ASSISTANT: none   ANESTHESIA:   local and MAC  SPECIMEN:  Source of Specimen:  anterior anal canal, left lateral anal canal  DISPOSITION OF SPECIMEN:  PATHOLOGY  COUNTS:  YES  PLAN OF CARE: Discharge to home after PACU  PATIENT DISPOSITION:  PACU - hemodynamically stable.  INDICATION: 75 y.o. F with anal lesions seen on colonoscopy.  Biopsy showed low grade AIN   OR FINDINGS:   DESCRIPTION: the patient was identified in the preoperative holding area and taken to the OR where they were laid on the operating room table.  MAC anesthesia was induced without difficulty. The patient was then positioned in prone jackknife position with buttocks gently taped apart.  The patient was then prepped and draped in usual sterile fashion.  SCDs were noted to be in place prior to the initiation of anesthesia. A surgical timeout was performed indicating the correct patient, procedure, positioning and need for preoperative antibiotics.  A rectal block was performed using Marcaine with epinephrine.    I began with a digital rectal exam.  A mass can be palpated in the left lateral anal canal. I then placed a Hill-Ferguson anoscope into the anal canal and evaluated this completely.   The mass was seen in the left lateral anal canal was approximately 1 cm in size but pedunculated.  There was also a 2 to 3 mm mass noted in the anterior anal canal.  I did place an acetic acid soaked sponge into the anal canal and all this tumor sit for approximately 90 seconds.  There were no further lesions noted with the staining.  I proceeded to excise both lesions using Metzenbaum scissors.  Hemostasis was achieved using interrupted 3-0 chromic sutures.  Once this was complete, a dressing was applied.  The patient was awakened from anesthesia and sent to the postanesthesia care unit in stable condition.  All counts were correct per operating room staff.

## 2021-02-14 NOTE — Discharge Instructions (Addendum)

## 2021-02-14 NOTE — Transfer of Care (Signed)
Immediate Anesthesia Transfer of Care Note  Patient: Wendy Patrick  Procedure(s) Performed: EXCISION OF ANAL LESIONS (N/A Anus) ANAL EXAM UNDER ANESTHESIA (N/A Anus)  Patient Location: PACU  Anesthesia Type:MAC  Level of Consciousness: awake, alert , oriented and patient cooperative  Airway & Oxygen Therapy: Patient Spontanous Breathing  Post-op Assessment: Report given to RN and Post -op Vital signs reviewed and stable  Post vital signs: Reviewed and stable  Last Vitals:  Vitals Value Taken Time  BP 110/46 02/14/21 0951  Temp    Pulse 75 02/14/21 0955  Resp 19 02/14/21 0955  SpO2 94 % 02/14/21 0955  Vitals shown include unvalidated device data.  Last Pain:  Vitals:   02/14/21 0820  TempSrc: Oral  PainSc: 0-No pain      Patients Stated Pain Goal: 6 (24/26/83 4196)  Complications: No complications documented.

## 2021-02-14 NOTE — Anesthesia Postprocedure Evaluation (Signed)
Anesthesia Post Note  Patient: Wendy Patrick  Procedure(s) Performed: EXCISION OF ANAL LESIONS (N/A Anus) ANAL EXAM UNDER ANESTHESIA (N/A Anus)     Patient location during evaluation: PACU Anesthesia Type: MAC Level of consciousness: awake and alert Pain management: pain level controlled Vital Signs Assessment: post-procedure vital signs reviewed and stable Respiratory status: spontaneous breathing and respiratory function stable Cardiovascular status: stable Postop Assessment: no apparent nausea or vomiting Anesthetic complications: no   No complications documented.  Last Vitals:  Vitals:   02/14/21 1000 02/14/21 1015  BP: (!) 115/47 (!) 129/50  Pulse: 69 68  Resp: 16 17  Temp:    SpO2: 92% 94%    Last Pain:  Vitals:   02/14/21 0951  TempSrc:   PainSc: 0-No pain                 Merlinda Frederick

## 2021-02-14 NOTE — Interval H&P Note (Signed)
History and Physical Interval Note:  02/14/2021 7:30 AM  Wendy Patrick  has presented today for surgery, with the diagnosis of AIN I.  The various methods of treatment have been discussed with the patient and family. After consideration of risks, benefits and other options for treatment, the patient has consented to  Procedure(s) with comments: EXCISION OF ANAL LESIONS (N/A) - 45 MIN TOTAL ANAL EXAM UNDER ANESTHESIA (N/A) as a surgical intervention.  The patient's history has been reviewed, patient examined, no change in status, stable for surgery.  I have reviewed the patient's chart and labs.  Questions were answered to the patient's satisfaction.     Rosario Adie, MD  [facility]

## 2021-02-15 ENCOUNTER — Encounter (HOSPITAL_BASED_OUTPATIENT_CLINIC_OR_DEPARTMENT_OTHER): Payer: Self-pay | Admitting: General Surgery

## 2021-02-15 LAB — SURGICAL PATHOLOGY

## 2021-03-08 ENCOUNTER — Ambulatory Visit: Payer: Medicare HMO | Admitting: Gastroenterology

## 2021-03-11 ENCOUNTER — Other Ambulatory Visit: Payer: Self-pay | Admitting: Pulmonary Disease

## 2021-03-26 ENCOUNTER — Encounter: Payer: Self-pay | Admitting: Gastroenterology

## 2021-03-26 ENCOUNTER — Ambulatory Visit: Payer: Medicare HMO | Admitting: Gastroenterology

## 2021-03-26 ENCOUNTER — Other Ambulatory Visit (HOSPITAL_COMMUNITY): Payer: Self-pay

## 2021-03-26 VITALS — BP 100/62 | HR 68 | Ht 64.0 in | Wt 126.0 lb

## 2021-03-26 DIAGNOSIS — R053 Chronic cough: Secondary | ICD-10-CM | POA: Diagnosis not present

## 2021-03-26 DIAGNOSIS — R1310 Dysphagia, unspecified: Secondary | ICD-10-CM | POA: Diagnosis not present

## 2021-03-26 DIAGNOSIS — K219 Gastro-esophageal reflux disease without esophagitis: Secondary | ICD-10-CM

## 2021-03-26 DIAGNOSIS — D013 Carcinoma in situ of anus and anal canal: Secondary | ICD-10-CM | POA: Diagnosis not present

## 2021-03-26 DIAGNOSIS — R198 Other specified symptoms and signs involving the digestive system and abdomen: Secondary | ICD-10-CM

## 2021-03-26 DIAGNOSIS — R0989 Other specified symptoms and signs involving the circulatory and respiratory systems: Secondary | ICD-10-CM

## 2021-03-26 NOTE — Patient Instructions (Signed)
You have been scheduled for a Barium Esophogram at [facility] Rad[facility] (1st floor of the hospital) on 04/09/2021 at 10:30am. Please arrive 15 minutes prior to your appointment for registration. Make certain not to have anything to eat or drink 3 hours prior to your test. If you need to reschedule for any reason, please contact radiology at [phone] to do so. __________________________________________________________________ A barium swallow is an examination that concentrates on views of the esophagus. This tends to be a double contrast exam (barium and two liquids which, when combined, create a gas to distend the wall of the oesophagus) or single contrast (non-ionic iodine based). The study is usually tailored to your symptoms so a good history is essential. Attention is paid during the study to the form, structure and configuration of the esophagus, looking for functional disorders (such as aspiration, dysphagia, achalasia, motility and reflux) EXAMINATION You may be asked to change into a gown, depending on the type of swallow being performed. A radiologist and radiographer will perform the procedure. The radiologist will advise you of the type of contrast selected for your procedure and direct you during the exam. You will be asked to stand, sit or lie in several different positions and to hold a small amount of fluid in your mouth before being asked to swallow while the imaging is performed .In some instances you may be asked to swallow barium coated marshmallows to assess the motility of a solid food bolus. The exam can be recorded as a digital or video fluoroscopy procedure. POST PROCEDURE It will take 1-2 days for the barium to pass through your system. To facilitate this, it is important, unless otherwise directed, to increase your fluids for the next 24-48hrs and to resume your normal diet.  This test typically takes about 30 minutes to  perform. __________________________________________________________________________________   Dennis Bast have been scheduled for a modified barium swallow on 04/09/2021 to follow after the barium swallow. Please arrive 15 minutes prior to your test for registration. You will go to [facility] (1st Floor) for your appointment. Should you need to cancel or reschedule your appointment, please contact [phone] [facility] [place]) or [phone] [place]). _____________________________________________________________________ A Modified Barium Swallow Study, or MBS, is a special x-ray that is taken to check swallowing skills. It is carried out by a Stage manager and a Psychologist, clinical (SLP). During this test, yourmouth, throat, and esophagus, a muscular tube which connects your mouth to your stomach, is checked. The test will help you, your doctor, and the SLP plan what types of foods and liquids are easier for you to swallow. The SLP will also identify positions and ways to help you swallow more easily and safely. What will happen during an MBS? You will be taken to an x-ray room and seated comfortably. You will be asked to swallow small amounts of food and liquid mixed with barium. Barium is a liquid or paste that allows images of your mouth, throat and esophagus to be seen on x-ray. The x-ray captures moving images of the food you are swallowing as it travels from your mouth through your throat and into your esophagus. This test helps identify whether food or liquid is entering your lungs (aspiration). The test also shows which part of your mouth or throat lacks strength or coordination to move the food or liquid in the right direction. This test typically takes 30 minutes to 1 hour to complete. _______________________________________________________________________  Due to recent changes in healthcare laws, you may see the results  of your imaging and laboratory studies on MyChart before your  provider has had a chance to review them.  We understand that in some cases there may be results that are confusing or concerning to you. Not all laboratory results come back in the same time frame and the provider may be waiting for multiple results in order to interpret others.  Please give [place] 48 hours in order for your provider to thoroughly review all the results before contacting the office for clarification of your results.   Thank you for choosing [facility]  Karleen Hampshire Nand[place]am,MD

## 2021-03-26 NOTE — Progress Notes (Signed)
Wendy Patrick    [id number]    June 18, 1946  Primary Care Physician:Crawford, Real Cons, MD  Referring Physician: Hoyt Koch, MD [address] 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  No food impactions.  No vomiting or abdominal pain. She is taking pantoprazole and following antireflux measures.  CT chest with contrast 12/04/20 1. Unchanged subcarinal soft tissue attenuation mass measuring 3.0 x 2.3 cm. This was previously non FDG avid and as on prior examination, may reflect an enlarged reactive lymph node or alternately a bronchial or foregut duplication cyst with proteinaceous contents. Given initially established stability and lack of FDG avidity, this is highly likely to be benign. 2. Numerous prominent mediastinal and bilateral hilar lymph nodes are unchanged, almost certainly reactive. 3. Emphysema. 4. Coronary artery disease. 5. Hepatic steatosis.  EGD 09/10/20 - No gross lesions were noted in the entire esophagus. - The Z-line was regular and was found 35 cm from the incisors. - A small hiatal hernia was present. - The gastroesophageal flap valve was visualized endoscopically and classified as Hill Grade III (minimal fold, loose to endoscope, hiatal hernia likely). - The stomach was normal. - The cardia and gastric fundus were normal on retroflexion. - The examined duodenum was normal.  Colonoscopy September 10, 2020 - Rule out malignancy, polypoid lesion at the anus. Biopsied. - Four erosions in the rectum, in the ascending colon and in the  cecum likely secondary to NSAID's . - Non-bleeding internal hemorrhoids.  Outpatient Encounter Medications as of 03/26/2021  Medication Sig  . albuterol (VENTOLIN HFA) 108 (90 Base) MCG/ACT inhaler Inhale 2 puffs into the lungs every 4 (four) hours as needed for wheezing or shortness of breath.  . Ascorbic Acid (VITAMIN C) 1000 MG tablet Take 1,000 mg by mouth daily.  Marland Kitchen aspirin EC 81 MG tablet Take 81 mg by mouth. Takes 2 x week  . azelastine (ASTELIN) 0.1 % nasal spray Place 2 sprays into both nostrils 2 (two) times daily. Use in each nostril as directed  . cholecalciferol (VITAMIN D) 1000 units tablet Take 10,000 Units by mouth daily. Vitamin d 3  . fluticasone (FLONASE) 50 MCG/ACT nasal spray Place 2 sprays into both nostrils daily.  Marland Kitchen ipratropium (ATROVENT) 0.03 % nasal spray Place 2 sprays into both nostrils every 12 (twelve) hours.  Marland Kitchen KRILL OIL PO Take 250 mg by mouth daily.  . Loratadine 10 MG CAPS Take 10 mg by mouth daily.  . Multiple Vitamins-Minerals (CENTRUM SILVER PO) Take by mouth daily at 2 PM.  . pantoprazole (PROTONIX) 40 MG tablet TAKE ONE TABLET BY MOUTH DAILY  . traMADol (ULTRAM) 50 MG tablet Take 1 tablet (50 mg total) by mouth every 6 (six) hours as needed.  . umeclidinium-vilanterol (ANORO ELLIPTA) 62.5-25 MCG/INH AEPB Inhale 1 puff into the lungs daily.   No facility-administered encounter medications on file as of 03/26/2021.    Allergies as of 03/26/2021 - Review Complete 03/26/2021  Allergen Reaction Noted  . Bevespi aerosphere [glycopyrrolate-formoterol] Other (See Comments) 09/12/2020  . Statins  08/13/2016  . Crestor [rosuvastatin] Other (  See Comments) 10/09/2014  . Lamotrigine Rash 01/15/2021  . Latex Rash 10/09/2014    Past Medical History:  Diagnosis Date  . AIN grade I   . COPD (chronic obstructive pulmonary disease) ([facility])   . Coronary artery disease 08/2009   40% mid left circ by cath in [place], [place]  . Dyspnea    with heavy exertion  .  Endometriosis    1986  . Endometriosis yrs ago  . Family history of adverse reaction to anesthesia    sister ponv  . GERD (gastroesophageal reflux disease)   . Granuloma annulare   . Gum lesion    per pt left jawborn has small knot on gum on back tooth, is not painful or draining  . Headache   . History of gestational diabetes many  yrs ago  . History of hiatal hernia    slight hh  . History of kidney stones 03/20/2011   passed on own  . History of migraine yrs ago  . Hyperlipidemia   . 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 Optic neuritis Novembr  . Trigeminal neuralgia   . Urinary urgency   . Vaginal anomaly    tears and bleeds easily    Past Surgical History:  Procedure Laterality Date  . ABDOMINAL HYSTERECTOMY  age 63  . CARDIAC CATHETERIZATION  03-19-2009  . COLONOSCOPY  12/2019   3 polyps removed 1 pre cancer  . EXCISION OF SKIN TAG N/A 02/14/2021   Procedure: EXCISION OF ANAL LESIONS;  Surgeon: Leighton Ruff, MD;  Location: [facility];  Service: General;  Laterality: N/A;  45 MIN TOTAL  . EYE SURGERY Bilateral 03/19/2016   both eyes cataracts lens replacement  . LEFT HEART CATH AND CORONARY ANGIOGRAPHY N/A 04/17/2020   Procedure: LEFT HEART CATH AND CORONARY ANGIOGRAPHY;  Surgeon: Sherren Mocha, MD;  Location: [facility];  Service: Cardiovascular;  Laterality: N/A;  . MANDIBLE SURGERY     gum tumor removed in [place] not sure if was benign per pt  . MINOR HEMORRHOIDECTOMY  yrs ago  . RECTAL EXAM UNDER ANESTHESIA N/A 02/14/2021   Procedure: ANAL EXAM UNDER ANESTHESIA;  Surgeon: Leighton Ruff, MD;  Location: [facility];  Service: General;  Laterality: N/A;  . TUBAL LIGATION  yrs ago  . UPPER GASTROINTESTINAL ENDOSCOPY  12/2019    Family History  Problem Relation Age of Onset  . Cancer Mother        Breast   . Breast cancer Mother   . Stroke Brother        open heart surgery   . Colon cancer Neg Hx   . Esophageal cancer Neg Hx   . Rectal  cancer Neg Hx   . Stomach cancer Neg Hx     Social History   Socioeconomic History  . Marital status: Married    Spouse name: Iona Beard  . Number of children: 1  . Years of education: GED  . Highest education level: Not on file  Occupational History  . Occupation: Retired  Tobacco Use  . Smoking status: Former Smoker    Packs/day: 2.00    Years: 40.00    Pack years: 80.00    Types: Cigarettes    Quit date: 02/05/1996    Years since quitting: 25.1  . Smokeless tobacco: Never Used  . Tobacco comment: Educated LDCT for lung Ca to discuss with MD if interested  Vaping Use  . Vaping Use: Never used  Substance and Sexual  Activity  . Alcohol use: Yes    Alcohol/week: 3.0 standard drinks    Types: 3 Glasses of wine per week    Comment: wine with dinner occ  . Drug use: No  . Sexual activity: Never    Partners: Male  Other Topics Concern  . Not on file  Social History Narrative   Lives w/ husband    Caffeine use: Coffee daily   Right handed    Social Determinants of Health   Financial Resource Strain: Not on file  Food Insecurity: Not on file  Transportation Needs: Not on file  Physical Activity: Not on file  Stress: Not on file  Social Connections: Not on file  Intimate Partner Violence: Not on file      Review of systems: All other review of systems negative except as mentioned in the HPI.   Physical Exam: Vitals:   03/26/21 1008  BP: 100/62  Pulse: 68   Body mass index is 21.63 kg/m. 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  Surveillance as scheduled  Return after barium swallow study   The patient was provided an opportunity to ask questions and all were answered. The patient agreed with the plan and demonstrated an understanding of the instructions.  Damaris Hippo , MD    CC: Hoyt Koch, *

## 2021-03-27 ENCOUNTER — Ambulatory Visit: Payer: Medicare HMO | Admitting: Pulmonary Disease

## 2021-03-27 ENCOUNTER — Encounter: Payer: Self-pay | Admitting: Pulmonary Disease

## 2021-03-27 ENCOUNTER — Other Ambulatory Visit: Payer: Self-pay

## 2021-03-27 VITALS — BP 120/70 | HR 83 | Temp 97.1°F | Ht 64.0 in | Wt 126.0 lb

## 2021-03-27 DIAGNOSIS — R053 Chronic cough: Secondary | ICD-10-CM | POA: Diagnosis not present

## 2021-03-27 DIAGNOSIS — J439 Emphysema, unspecified: Secondary | ICD-10-CM | POA: Diagnosis not present

## 2021-03-27 NOTE — Progress Notes (Signed)
Synopsis: Referred in July 2021 for SOB by Hoyt Koch,  Subjective:   PATIENT ID: Wendy Patrick GENDER: female DOB: 1946-08-29, MRN: [id number]   HPI  Chief Complaint  Patient presents with  . Follow-up    Dry cough, Sob with activity.    Wendy Patrick is a 75 year old woman, former smoker with COPD/Emphysema, subcarinal mass, chronic rhinitis, post-nasal drip and chronic cough who returns to pulmonary clinic for follow up   She is performing nasal rinses in the evenings and is using flonase, claritin, azelastine and ipratropium nasal sprays. She is experiencing dry cough, throat irritation and reports trouble swallowing. She saw GI on 4/26 and is scheduled for a barium swallow. She is using anoro ellipta for her COPD. She is taking pantoprazole 40mg  daily for GERD.   Past Medical History:  Diagnosis Date  . AIN grade I   . COPD (chronic obstructive pulmonary disease) ([place])   . Coronary artery disease 08/2009   40% mid left circ by cath in [place], [place]  . Dyspnea    with heavy exertion  . Endometriosis    1986  . Endometriosis yrs ago  . Family history of adverse reaction to anesthesia    sister ponv  . GERD (gastroesophageal reflux disease)   . Granuloma annulare   . Gum lesion    per pt left jawborn has small knot on gum on back tooth, is not painful or draining  . Headache   . History of gestational diabetes many  yrs ago  . History of hiatal hernia    slight hh  . History of kidney stones Mar 28, 2011   passed on own  . History of migraine yrs ago  . Hyperlipidemia   . Optic neuritis    03-27-14  . Optic neuritis Novembr  . Trigeminal neuralgia   . Urinary urgency   . Vaginal anomaly    tears and bleeds easily     Family History  Problem Relation Age of Onset  . Cancer Mother        Breast   . Breast cancer Mother   . Stroke Brother        open heart surgery   . Colon cancer Neg Hx   . Esophageal cancer Neg Hx   . Rectal cancer Neg Hx   . Stomach  cancer Neg Hx      Social History   Socioeconomic History  . Marital status: Married    Spouse name: Iona Beard  . Number of children: 1  . Years of education: GED  . Highest education level: Not on file  Occupational History  . Occupation: Retired  Tobacco Use  . Smoking status: Former Smoker    Packs/day: 2.00    Years: 40.00    Pack years: 80.00    Types: Cigarettes    Quit date: 02/05/1996    Years since quitting: 25.2  . Smokeless tobacco: Never Used  . Tobacco comment: Educated LDCT for lung Ca to discuss with MD if interested  Vaping Use  . Vaping Use: Never used  Substance and Sexual Activity  . Alcohol use: Yes    Alcohol/week: 3.0 standard drinks    Types: 3 Glasses of wine per week    Comment: wine with dinner occ  . Drug use: No  . Sexual activity: Never    Partners: Male  Other Topics Concern  . Not on file  Social History Narrative   Lives w/ husband    Caffeine  use: Coffee daily   Right handed    Social Determinants of Health   Financial Resource Strain: Not on file  Food Insecurity: Not on file  Transportation Needs: Not on file  Physical Activity: Not on file  Stress: Not on file  Social Connections: Not on file  Intimate Partner Violence: Not on file     Allergies  Allergen Reactions  . Bevespi Aerosphere [Glycopyrrolate-Formoterol] Other (See Comments)    Patient felt like her throat was closing up  . Statins     Leg cramps  . Crestor [Rosuvastatin] Other (See Comments)    Leg cramps  . Lamotrigine Rash  . Latex Rash     Outpatient Medications Prior to Visit  Medication Sig Dispense Refill  . albuterol (VENTOLIN HFA) 108 (90 Base) MCG/ACT inhaler Inhale 2 puffs into the lungs every 4 (four) hours as needed for wheezing or shortness of breath. 18 g 11  . Ascorbic Acid (VITAMIN C) 1000 MG tablet Take 1,000 mg by mouth daily.    Marland Kitchen aspirin EC 81 MG tablet Take 81 mg by mouth. Takes 2 x week    . azelastine (ASTELIN) 0.1 % nasal spray Place  2 sprays into both nostrils 2 (two) times daily. Use in each nostril as directed 30 mL 12  . cholecalciferol (VITAMIN D) 1000 units tablet Take 10,000 Units by mouth daily. Vitamin d 3    . fluticasone (FLONASE) 50 MCG/ACT nasal spray Place 2 sprays into both nostrils daily. 16 g 11  . KRILL OIL PO Take 250 mg by mouth daily.    . Loratadine 10 MG CAPS Take 10 mg by mouth daily.    . Multiple Vitamins-Minerals (CENTRUM SILVER PO) Take by mouth daily at 2 PM.    . pantoprazole (PROTONIX) 40 MG tablet TAKE ONE TABLET BY MOUTH DAILY 30 tablet 6  . traMADol (ULTRAM) 50 MG tablet Take 1 tablet (50 mg total) by mouth every 6 (six) hours as needed. 15 tablet 0  . umeclidinium-vilanterol (ANORO ELLIPTA) 62.5-25 MCG/INH AEPB Inhale 1 puff into the lungs daily. 1 each 11  . ipratropium (ATROVENT) 0.03 % nasal spray Place 2 sprays into both nostrils every 12 (twelve) hours. 30 mL 12   No facility-administered medications prior to visit.    Review of Systems  Constitutional: Negative for chills, diaphoresis, fever, malaise/fatigue and weight loss.  HENT: Negative for congestion, sinus pain and sore throat.   Eyes: Negative.   Respiratory: Positive for cough and shortness of breath. Negative for hemoptysis, sputum production and wheezing.   Cardiovascular: Negative for chest pain, palpitations, orthopnea, claudication, leg swelling and PND.  Gastrointestinal: Positive for heartburn. Negative for abdominal pain, nausea and vomiting.  Genitourinary: Negative.   Musculoskeletal: Negative.   Skin: Negative for rash.  Neurological: Negative for dizziness, seizures and headaches.  Endo/Heme/Allergies: Negative.   Psychiatric/Behavioral: Negative.    Objective:   Vitals:   03/27/21 1329  BP: 120/70  Pulse: 83  Temp: (!) 97.1 F (36.2 C)  SpO2: 98%  Weight: 126 lb (57.2 kg)  Height: 5\' 4"  (1.626 m)     Physical Exam Constitutional:      General: She is not in acute distress. HENT:     Head:  Normocephalic and atraumatic.  Eyes:     General: No scleral icterus.    Conjunctiva/sclera: Conjunctivae normal.  Cardiovascular:     Rate and Rhythm: Normal rate and regular rhythm.     Pulses: Normal pulses.  Heart sounds: Normal heart sounds. No murmur heard.   Pulmonary:     Effort: Pulmonary effort is normal.     Breath sounds: Normal breath sounds. No wheezing, rhonchi or rales.  Abdominal:     General: Bowel sounds are normal.     Palpations: Abdomen is soft.  Musculoskeletal:     Right lower leg: No edema.     Left lower leg: No edema.  Skin:    General: Skin is warm and dry.     Capillary Refill: Capillary refill takes less than 2 seconds.  Neurological:     General: No focal deficit present.     Mental Status: She is alert.  Psychiatric:        Mood and Affect: Mood normal.        Behavior: Behavior normal.        Thought Content: Thought content normal.        Judgment: Judgment normal.     CBC    Component Value Date/Time   WBC 6.5 04/13/2020 1047   WBC 6.6 02/01/2019 0830   RBC 5.31 (H) 04/13/2020 1047   RBC 5.30 (H) 02/01/2019 0830   HGB 15.6 (H) 02/14/2021 0819   HGB 14.3 04/13/2020 1047   HCT 46.0 02/14/2021 0819   HCT 44.5 04/13/2020 1047   PLT 375 04/13/2020 1047   MCV 84 04/13/2020 1047   MCH 26.9 04/13/2020 1047   MCH 28.3 10/10/2014 0834   MCHC 32.1 04/13/2020 1047   MCHC 32.8 02/01/2019 0830   RDW 12.7 04/13/2020 1047   LYMPHSABS 1.6 03/08/2018 0932   MONOABS 0.7 03/08/2018 0932   EOSABS 0.2 03/08/2018 0932   BASOSABS 0.0 03/08/2018 0932   BMP Latest Ref Rng & Units 02/14/2021 11/27/2020 06/12/2020  Glucose 70 - 99 mg/dL 105(H) 92 103(H)  BUN 8 - 23 mg/dL 22 11 16   Creatinine 0.44 - 1.00 mg/dL 0.90 1.20 0.94  BUN/Creat Ratio 12 - 28 - - -  Sodium 135 - 145 mmol/L 141 139 139  Potassium 3.5 - 5.1 mmol/L 4.8 4.1 4.3  Chloride 98 - 111 mmol/L 104 102 102  CO2 19 - 32 mEq/L - 29 30  Calcium 8.4 - 10.5 mg/dL - 9.6 9.7   Chest  imaging: PET Scan 06/28/20 1. No hypermetabolism to correspond to the subcarinal mass, which may have decreased slightly in size. Differential considerations include reactive node or complex bronchogenic cyst or GI duplication cyst. As malignant etiologies are not entirely excluded, recommend chest CT follow-up (ideally with contrast) at 3-6 months. 2. Left adrenal adenoma. 3. Focal hypermetabolism within the transverse colon for which polyp or mass cannot be excluded. Correlate with colon cancer screening history and consider colonoscopy if not up-to-date. 4. Incidental findings, including: Aortic atherosclerosis (ICD10-I70.0), coronary artery atherosclerosis and emphysema (ICD10-J43.9).  CT Chest W/ Contrast 06/19/2020 1. Subcarinal 2.8 x 2.1 cm soft tissue density mass is stable since 04/04/2020 coronary CT, with differential including pathologic subcarinal lymph node versus dense bronchopulmonary or esophageal duplication cyst. Mild AP window lymphadenopathy. Recommend further characterization with PET-CT at this time given the advanced smoking related changes in the lungs. 2. Marked centrilobular emphysema with diffuse bronchial wall thickening, suggesting COPD. No lung masses or significant pulmonary nodules. 3. Two-vessel coronary atherosclerosis. 4. Small hiatal hernia. 5. Bilateral adrenal adenomas. 6. Aortic Atherosclerosis (ICD10-I70.0) and Emphysema (ICD10-J43.9).  PFT: PFT Results Latest Ref Rng & Units 08/17/2020  FVC-Pre L 2.28  FVC-Predicted Pre % 84  FVC-Post L 1.89  FVC-Predicted  Post % 70  Pre FEV1/FVC % % 64  Post FEV1/FCV % % 67  FEV1-Pre L 1.46  FEV1-Predicted Pre % 72  FEV1-Post L 1.27  DLCO uncorrected ml/min/mmHg 8.53  DLCO UNC% % 46  DLCO corrected ml/min/mmHg 8.53  DLCO COR %Predicted % 46  DLVA Predicted % 63  TLC L 4.75  TLC % Predicted % 96  RV % Predicted % 128   Echo: 10/10/2014 - Left ventricle: The cavity size was normal. Wall  thickness was  normal. Systolic function was normal. The estimated ejection  fraction was in the range of 60% to 65%. Wall motion was normal;  there were no regional wall motion abnormalities.  - Mitral valve: Calcified annulus. There was no significant  regurgitation.  - Right ventricle: The cavity size was normal. Systolic function  was normal.  - Impressions: No cardiac source of embolism was identified, but  cannot be ruled out on the basis of this examination.  Heart Catheterization: 04/17/20 1.  Nonobstructive coronary artery disease with mild nonobstructive stenosis of the proximal to mid LAD and moderate nonobstructive stenosis of the mid left circumflex, confirmed to be nonobstructive by invasive RFR evaluation 2.  Normal LVEDP  Assessment & Plan:   No diagnosis found.  Discussion: Wendy Patrick is a 75 year old woman, former smoker with COPD/Emphysema, subcarinal mass, chronic rhinitis, post-nasal drip and chronic cough who returns to pulmonary clinic for follow up    Her nasal congestion and post-nasal drainage appear improved with sinus rinses, fluticasone nasal spray, astelin nasal spray and ipratropium nasasl spray along with loratidine. She can stop the ipratropium nasal spray and monitor for worsening of her symptoms.   She is to continue pantoprazole 40mg  dialy for GERD.   She is to continue anoro ellipta and as needed albuterol for her COPD and shortness of breath.   We discussed ordering a home sleep study to evaluate for obstructive sleep apnea given her progressive fatigue, but does not want to pursue further testing at this time.   Follow up in 6 months  Freda Jackson, MD [facility] Office: [phone]   See Amion for Pager Details    Current Outpatient Medications:  .  albuterol (VENTOLIN HFA) 108 (90 Base) MCG/ACT inhaler, Inhale 2 puffs into the lungs every 4 (four) hours as needed for wheezing or shortness of  breath., Disp: 18 g, Rfl: 11 .  Ascorbic Acid (VITAMIN C) 1000 MG tablet, Take 1,000 mg by mouth daily., Disp: , Rfl:  .  aspirin EC 81 MG tablet, Take 81 mg by mouth. Takes 2 x week, Disp: , Rfl:  .  azelastine (ASTELIN) 0.1 % nasal spray, Place 2 sprays into both nostrils 2 (two) times daily. Use in each nostril as directed, Disp: 30 mL, Rfl: 12 .  cholecalciferol (VITAMIN D) 1000 units tablet, Take 10,000 Units by mouth daily. Vitamin d 3, Disp: , Rfl:  .  fluticasone (FLONASE) 50 MCG/ACT nasal spray, Place 2 sprays into both nostrils daily., Disp: 16 g, Rfl: 11 .  KRILL OIL PO, Take 250 mg by mouth daily., Disp: , Rfl:  .  Loratadine 10 MG CAPS, Take 10 mg by mouth daily., Disp: , Rfl:  .  Multiple Vitamins-Minerals (CENTRUM SILVER PO), Take by mouth daily at 2 PM., Disp: , Rfl:  .  pantoprazole (PROTONIX) 40 MG tablet, TAKE ONE TABLET BY MOUTH DAILY, Disp: 30 tablet, Rfl: 6 .  traMADol (ULTRAM) 50 MG tablet, Take 1 tablet (50  mg total) by mouth every 6 (six) hours as needed., Disp: 15 tablet, Rfl: 0 .  umeclidinium-vilanterol (ANORO ELLIPTA) 62.5-25 MCG/INH AEPB, Inhale 1 puff into the lungs daily., Disp: 1 each, Rfl: 11

## 2021-03-27 NOTE — Patient Instructions (Addendum)
Stop ipratropium nasal spray.   Continue to use azelastine and fluticasone nasal spray.   Continue pantoprazole 40mg  daily.   Continue Anoro ellipta inhaler   We will follow up the swallow study that you have in the future.   We will consider an ENT referral.

## 2021-04-09 ENCOUNTER — Ambulatory Visit (HOSPITAL_COMMUNITY)
Admission: RE | Admit: 2021-04-09 | Discharge: 2021-04-09 | Disposition: A | Discharge status: Home / Self Care | Payer: Medicare HMO | Source: Ambulatory Visit | Attending: Gastroenterology | Admitting: Gastroenterology

## 2021-04-09 ENCOUNTER — Other Ambulatory Visit: Payer: Self-pay

## 2021-04-09 DIAGNOSIS — R0989 Other specified symptoms and signs involving the circulatory and respiratory systems: Secondary | ICD-10-CM

## 2021-04-09 DIAGNOSIS — R1310 Dysphagia, unspecified: Secondary | ICD-10-CM

## 2021-04-09 DIAGNOSIS — R198 Other specified symptoms and signs involving the digestive system and abdomen: Secondary | ICD-10-CM | POA: Insufficient documentation

## 2021-04-09 DIAGNOSIS — Z0389 Encounter for observation for other suspected diseases and conditions ruled out: Secondary | ICD-10-CM | POA: Diagnosis not present

## 2021-04-09 DIAGNOSIS — K219 Gastro-esophageal reflux disease without esophagitis: Secondary | ICD-10-CM | POA: Diagnosis not present

## 2021-04-09 DIAGNOSIS — R09A2 Foreign body sensation, throat: Secondary | ICD-10-CM

## 2021-04-23 ENCOUNTER — Encounter: Payer: Self-pay | Admitting: Pulmonary Disease

## 2021-05-13 DIAGNOSIS — R21 Rash and other nonspecific skin eruption: Secondary | ICD-10-CM | POA: Diagnosis not present

## 2021-05-31 ENCOUNTER — Ambulatory Visit (INDEPENDENT_AMBULATORY_CARE_PROVIDER_SITE_OTHER): Payer: Medicare HMO | Admitting: Internal Medicine

## 2021-05-31 ENCOUNTER — Ambulatory Visit (INDEPENDENT_AMBULATORY_CARE_PROVIDER_SITE_OTHER): Payer: Medicare HMO

## 2021-05-31 ENCOUNTER — Encounter: Payer: Self-pay | Admitting: Internal Medicine

## 2021-05-31 ENCOUNTER — Other Ambulatory Visit: Payer: Self-pay

## 2021-05-31 VITALS — BP 122/80 | HR 86 | Temp 98.1°F | Resp 18 | Ht 64.0 in | Wt 126.4 lb

## 2021-05-31 DIAGNOSIS — M542 Cervicalgia: Secondary | ICD-10-CM | POA: Diagnosis not present

## 2021-05-31 DIAGNOSIS — M50323 Other cervical disc degeneration at C6-C7 level: Secondary | ICD-10-CM | POA: Diagnosis not present

## 2021-05-31 DIAGNOSIS — R21 Rash and other nonspecific skin eruption: Secondary | ICD-10-CM | POA: Diagnosis not present

## 2021-05-31 LAB — CK: Total CK: 142 U/L (ref 7–177)

## 2021-05-31 MED ORDER — NYSTATIN-TRIAMCINOLONE 100000-0.1 UNIT/GM-% EX OINT: 1.0000 "application " | TOPICAL_OINTMENT | Freq: Two times a day (BID) | CUTANEOUS | 0 refills | Status: DC

## 2021-05-31 NOTE — Assessment & Plan Note (Signed)
Rx mycolog cream and checking CK and ANA to rule out autoimmune cause.

## 2021-05-31 NOTE — Patient Instructions (Addendum)
We are checking the labs and the x-ray today.  In the meantime keep using tylenol for pain up to 1000 mg 3 times a day.   We have sent in a cream to use on the rash twice a day.

## 2021-05-31 NOTE — Progress Notes (Signed)
   Subjective:   Patient ID: Wendy Patrick, female    DOB: [birth year], 75 y.o.   MRN: [id number]  HPI The patient is a 76 YO female coming in for neck pain (started about 2-3 weeks ago, stiff and painful with movement, 10/10, taking otc tylenol for pain with some relief, some pain down her arms, denies numbness or weakness in her arms, denies injury at onset), and rash on her neck (going on for months, treated with prednisone by urgent care which did help slightly but not a lot, itching).   Review of Systems  Constitutional: Negative.   HENT: Negative.    Eyes: Negative.   Respiratory:  Negative for cough, chest tightness and shortness of breath.   Cardiovascular:  Negative for chest pain, palpitations and leg swelling.  Gastrointestinal:  Negative for abdominal distention, abdominal pain, constipation, diarrhea, nausea and vomiting.  Musculoskeletal:  Positive for neck pain and neck stiffness.  Skin:  Positive for rash.  Neurological: Negative.   Psychiatric/Behavioral: Negative.     Objective:  Physical Exam Constitutional:      Appearance: She is well-developed.  HENT:     Head: Normocephalic and atraumatic.  Cardiovascular:     Rate and Rhythm: Normal rate and regular rhythm.  Pulmonary:     Effort: Pulmonary effort is normal. No respiratory distress.     Breath sounds: Normal breath sounds. No wheezing or rales.  Abdominal:     General: Bowel sounds are normal. There is no distension.     Palpations: Abdomen is soft.     Tenderness: There is no abdominal tenderness. There is no rebound.  Musculoskeletal:        General: Tenderness present.     Cervical back: Normal range of motion.     Comments: Pain left lateral neck with muscle tightness, rash on the posterior neck and into the scalp  Skin:    General: Skin is warm and dry.  Neurological:     Mental Status: She is alert and oriented to person, place, and time.     Coordination: Coordination normal.    Vitals:    05/31/21 1033  BP: 122/80  Pulse: 86  Resp: 18  Temp: 98.1 F (36.7 C)  TempSrc: Oral  SpO2: 98%  Weight: 126 lb 6.4 oz (57.3 kg)  Height: 5\' 4"  (1.626 m)    This visit occurred during the SARS-CoV-2 public health emergency.  Safety protocols were in place, including screening questions prior to the visit, additional usage of staff PPE, and extensive cleaning of exam room while observing appropriate contact time as indicated for disinfecting solutions.   Assessment & Plan:

## 2021-05-31 NOTE — Assessment & Plan Note (Addendum)
Checking CK and ANA given rash and neck pain to exclude dermatomyositis although the rash is not in typical distribution and pain does not clearly match pattern of more pain and fatigue with movement. Checking x-ray of the neck as arthritis cause seems most likely.

## 2021-06-06 ENCOUNTER — Telehealth: Payer: Self-pay

## 2021-06-06 NOTE — Telephone Encounter (Signed)
Prior authorization for Nystatin-Triamcinolone Cream has been initiated on covermymeds Key: [facility] Rx #: [id number] [place] Case ID: [id number]  Awaiting determination

## 2021-06-12 LAB — ANA W/REFLEX: ANA Titer 1: NEGATIVE

## 2021-07-06 ENCOUNTER — Other Ambulatory Visit: Payer: Self-pay | Admitting: Critical Care Medicine

## 2021-08-13 ENCOUNTER — Other Ambulatory Visit: Payer: Self-pay

## 2021-08-13 ENCOUNTER — Ambulatory Visit: Payer: Medicare HMO | Admitting: Internal Medicine

## 2021-08-13 ENCOUNTER — Encounter: Payer: Self-pay | Admitting: Internal Medicine

## 2021-08-13 VITALS — BP 109/70 | HR 74 | Ht 64.0 in | Wt 131.0 lb

## 2021-08-13 DIAGNOSIS — T466X5D Adverse effect of antihyperlipidemic and antiarteriosclerotic drugs, subsequent encounter: Secondary | ICD-10-CM | POA: Diagnosis not present

## 2021-08-13 DIAGNOSIS — E785 Hyperlipidemia, unspecified: Secondary | ICD-10-CM

## 2021-08-13 DIAGNOSIS — I2510 Atherosclerotic heart disease of native coronary artery without angina pectoris: Secondary | ICD-10-CM | POA: Diagnosis not present

## 2021-08-13 DIAGNOSIS — M791 Myalgia, unspecified site: Secondary | ICD-10-CM

## 2021-08-13 DIAGNOSIS — T466X5A Adverse effect of antihyperlipidemic and antiarteriosclerotic drugs, initial encounter: Secondary | ICD-10-CM

## 2021-08-13 DIAGNOSIS — E7800 Pure hypercholesterolemia, unspecified: Secondary | ICD-10-CM | POA: Diagnosis not present

## 2021-08-13 LAB — LIPID PANEL
Chol/HDL Ratio: 4.4 ratio (ref 0.0–4.4)
Cholesterol, Total: 263 mg/dL — ABNORMAL HIGH (ref 100–199)
HDL: 60 mg/dL (ref >39)
LDL Chol Calc (NIH): 165 mg/dL — ABNORMAL HIGH (ref 0–99)
Triglycerides: 205 mg/dL — ABNORMAL HIGH (ref 0–149)
VLDL Cholesterol Cal: 38 mg/dL (ref 5–40)

## 2021-08-13 NOTE — Patient Instructions (Signed)
Medication Instructions:  Continue same medications *If you need a refill on your cardiac medications before your next appointment, please call your pharmacy*   Lab Work: Lipid panel today   Testing/Procedures: None ordered   Follow-Up: At [facility], you and your health needs are our priority.  As part of our continuing mission to provide you with exceptional heart care, we have created designated Provider Care Teams.  These Care Teams include your primary Cardiologist (physician) and Advanced Practice Providers (APPs -  Physician Assistants and Nurse Practitioners) who all work together to provide you with the care you need, when you need it.  We recommend signing up for the patient portal called "MyChart".  Sign up information is provided on this After Visit Summary.  MyChart is used to connect with patients for Virtual Visits (Telemedicine).  Patients are able to view lab/test results, encounter notes, upcoming appointments, etc.  Non-urgent messages can be sent to your provider as well.   To learn more about what you can do with MyChart, go to [email or web address].     Your next appointment:  6 months  Call in Dec to schedule March appointment    The format for your next appointment:  Office     Provider:  Dr.Hilty

## 2021-08-13 NOTE — Progress Notes (Signed)
OFFICE NOTE  Chief Complaint:  No complaints  Primary Care Physician: Hoyt Koch, MD  HPI:  Wendy Patrick is a 75 y.o. female with a past medial history significant for mild coronary artery disease with a 40% circumflex stenosis in 2010, COPD, type 2 diabetes, hyperlipidemia and statin intolerance.  She was previously followed by Dr. Radford Pax however requested to switch to my care since her husband is a patient of mine.  We will try to see them simultaneously.  Recently she has been having some issues with diarrhea.  She is been undergoing treatment with antibiotics and care by Dr. Watt Climes.  She feels drained.  She is also congested and having difficulty with allergies and chronic cough.  She reports labile blood pressures from the upper 90s to 140s, but has no history of hypertension and is not on medication.  In fact she takes only aspirin.  She has been previously seen by one of our PharmD's in the lipid clinic to discuss her dyslipidemia and was offered ezetimibe, but did not take the medication due to cost issues.  Currently she reports some discomfort in the left shoulder arm and left back.  This is similar but much less significant to the discomfort she had in 2010.  She says she gets it occasionally.  Is not necessarily worse with exertion or relieved by rest and does not sound particularly anginal to me.  She did have a nuclear stress test last year which was completely normal.  11/10/2018  Wendy Patrick returns today for routine follow-up.  She does have a history of coronary artery disease with some circumflex stenosis of 40% in 2010, type 2 diabetes, hyperlipidemia and statin intolerance with a goal LDL less than 70.  Her main issues have been with some degree of neuropathy for which she is on gabapentin.  She says she is not tolerating this well and feels tired and is having some mood changes related to that.  Unfortunately she has been statin intolerant.  She has been on both  atorvastatin and rosuvastatin which caused leg cramps.  We had discussed PCSK9 inhibitor therapy in the past however had not pursued it.  Her most recent lab work from June 2018 showed total cholesterol 270, HDL 60, LDL 178 and triglycerides 158.  Although her diet sound somewhat atherogenic, there may be a component of FH.   07/26/2019  Wendy Patrick is seen today in follow-up.  She is also been using Repatha.  Overall she seems to be tolerating however she has been having some concerns with runny nose and persistent cough.  This is atypical for the medication and she was concerned though it may be a side effect.  She needs a recheck of her lipids.  She was on gabapentin for neuropathy but this is been discontinued and has been seeing neurologist.  There was concern that she may have some nerve damage from an injection in her teeth that may be related to her cough and some possible difficulty swallowing.  02/22/2020  Wendy Patrick is seen today in follow-up with her husband. Unfortunately she stopped using Repatha. She had had a good response in her LDL cholesterol which went from 171 down to 119. I suspect that she could have FH as there is a strong family history of heart disease in her parents. She has had no cardiac symptoms that I was aware of however recently did have some left-sided chest discomfort. It apparently was pretty persistent and lasted for couple  hours. It was not associated with exertion or relieved by rest however did radiate into her left arm. According to her husband she had had a cardiovascular work-up by Dr. Fletcher Anon in the past which showed mild nonobstructive coronary disease with up to about a 40% stenosis. This was in 2010.  04/13/2020  Wendy Patrick is seen today in follow-up.  She underwent CT with FFR for ongoing chest pain on Apr 04, 2020.  I personally reviewed the study and demonstrated at least moderate mixed coronary disease in the circumflex with mild to moderate mixed coronary disease  of the LAD and RCA.  Coronary calcium score was 252, 76 percentile for age and sex matched control.  CT FFR was performed which demonstrated a significant flow-limiting mid to distal circumflex stenosis.  The FFR dropped suddenly from 0.97-0.62 suggesting a focal stenosis.  She had previously had a 40% stenosis in 2010 in this area and I suspect that has progressed.  Unfortunately she has been statin intolerant.  She also had been on Repatha but stopped it due to side effects.  In addition the over read of the CT suggested emphysema as well as subcarinal adenopathy.  She had previously seen Dr. Melvyn Novas with pulmonary who performed some chest x-rays and had been treating her for cough which did not improve.  She would like to follow up with a different pulmonologist.  I will refer her.  08/16/2020  Wendy Patrick is seen today in follow-up.  Based on her abnormal CT FFR she underwent definitive left heart catheterization in May 2021.  This demonstrated nonobstructive coronary disease with mild nonobstructive disease of the proximal to mid LAD and mid left circumflex.  LVEDP was normal.  Maximal medical therapy was recommended.  Unfortunate she cannot tolerate Repatha or statins.  LDL remained above target, last assessed in August 2020 at 119.  She is following a Mediterranean diet.  She will need repeat lipids.  She denies any chest pain.  She is working with our pulmonary and was noted to have some subcarinal lymphadenopathy.  She is scheduled to have pulmonary function testing tomorrow and a follow-up with Dr. Carlis Abbott.  08/13/2021  Wendy Patrick returns today for follow-up.  She is accompanied by her husband.  She says she is denying any chest pain or shortness of breath.  She does get some left sided pain over the lower ribs which can occur at rest typically sitting in the chair.  Cardiac catheterization last year showed nonobstructive coronary disease however she does have coronary calcification and a very high LDL  cholesterol over 190 as assessed in September 2021.  She is due for repeat lipids.  Pressure is well controlled today.   PMHx:  Past Medical History:  Diagnosis Date   AIN grade I    COPD (chronic obstructive pulmonary disease) ([place])    Coronary artery disease 08/2009   40% mid left circ by cath in [place], [place]   Dyspnea    with heavy exertion   Endometriosis    1986   Endometriosis yrs ago   Family history of adverse reaction to anesthesia    sister ponv   GERD (gastroesophageal reflux disease)    Granuloma annulare    Gum lesion    per pt left jawborn has small knot on gum on back tooth, is not painful or draining   Headache    History of gestational diabetes many  yrs ago   History of hiatal hernia    slight h[facility]  History of kidney stones 2012   passed on own   History of migraine yrs ago   Hyperlipidemia    Optic neuritis    2015   Optic neuritis Novembr   Trigeminal neuralgia    Urinary urgency    Vaginal anomaly    tears and bleeds easily    Past Surgical History:  Procedure Laterality Date   ABDOMINAL HYSTERECTOMY  age 58   CARDIAC CATHETERIZATION  2010   COLONOSCOPY  12/2019   3 polyps removed 1 pre cancer   EXCISION OF SKIN TAG N/A 02/14/2021   Procedure: EXCISION OF ANAL LESIONS;  Surgeon: Leighton Ruff, MD;  Location: [facility];  Service: General;  Laterality: N/A;  18 MIN TOTAL   EYE SURGERY Bilateral 2017   both eyes cataracts lens replacement   LEFT HEART CATH AND CORONARY ANGIOGRAPHY N/A 04/17/2020   Procedure: LEFT HEART CATH AND CORONARY ANGIOGRAPHY;  Surgeon: Sherren Mocha, MD;  Location: [facility];  Service: Cardiovascular;  Laterality: N/A;   MANDIBLE SURGERY     gum tumor removed in [place] not sure if was benign per pt   MINOR HEMORRHOIDECTOMY  yrs ago   RECTAL EXAM UNDER ANESTHESIA N/A 02/14/2021   Procedure: ANAL EXAM UNDER ANESTHESIA;  Surgeon: Leighton Ruff, MD;  Location: [facility];   Service: General;  Laterality: N/A;   TUBAL LIGATION  yrs ago   UPPER GASTROINTESTINAL ENDOSCOPY  12/2019    FAMHx:  Family History  Problem Relation Age of Onset   Cancer Mother        Breast    Breast cancer Mother    Stroke Brother        open heart surgery    Colon cancer Neg Hx    Esophageal cancer Neg Hx    Rectal cancer Neg Hx    Stomach cancer Neg Hx     SOCHx:   reports that she quit smoking about 25 years ago. Her smoking use included cigarettes. She has a 80.00 pack-year smoking history. She has never used smokeless tobacco. She reports current alcohol use of about 3.0 standard drinks per week. She reports that she does not use drugs.  ALLERGIES:  Allergies  Allergen Reactions   Bevespi Aerosphere [Glycopyrrolate-Formoterol] Other (See Comments)    Patient felt like her throat was closing up   Statins     Leg cramps   Crestor [Rosuvastatin] Other (See Comments)    Leg cramps   Lamotrigine Rash   Latex Rash    ROS: Pertinent items noted in HPI and remainder of comprehensive ROS otherwise negative.  HOME MEDS: Current Outpatient Medications on File Prior to Visit  Medication Sig Dispense Refill   albuterol (VENTOLIN HFA) 108 (90 Base) MCG/ACT inhaler Inhale 2 puffs into the lungs every 4 (four) hours as needed for wheezing or shortness of breath. 18 g 11   Ascorbic Acid (VITAMIN C) 1000 MG tablet Take 1,000 mg by mouth daily.     aspirin EC 81 MG tablet Take 81 mg by mouth. Takes 2 x week     azelastine (ASTELIN) 0.1 % nasal spray Place 2 sprays into both nostrils 2 (two) times daily. Use in each nostril as directed 30 mL 12   cholecalciferol (VITAMIN D) 1000 units tablet Take 10,000 Units by mouth daily. Vitamin d 3     fluticasone (FLONASE) 50 MCG/ACT nasal spray SPRAY TWO SPRAYS IN EACH NOSTRIL ONCE DAILY 16 mL 4   KRILL OIL  PO Take 250 mg by mouth daily.     Loratadine 10 MG CAPS Take 10 mg by mouth daily.     Multiple Vitamins-Minerals (CENTRUM SILVER  PO) Take by mouth daily at 2 PM.     nystatin-triamcinolone ointment (MYCOLOG) Apply 1 application topically 2 (two) times daily. 100 g 0   pantoprazole (PROTONIX) 40 MG tablet TAKE ONE TABLET BY MOUTH DAILY 30 tablet 6   umeclidinium-vilanterol (ANORO ELLIPTA) 62.5-25 MCG/INH AEPB Inhale 1 puff into the lungs daily. 1 each 11   No current facility-administered medications on file prior to visit.    LABS/IMAGING: No results found for this or any previous visit (from the past 48 hour(s)).  No results found.  LIPID PANEL:    Component Value Date/Time   CHOL 296 (H) 08/17/2020 1358   TRIG 248 (H) 08/17/2020 1358   HDL 57 08/17/2020 1358   CHOLHDL 5.2 (H) 08/17/2020 1358   CHOLHDL 4 06/05/2016 0843   VLDL 35.8 06/05/2016 0843   LDLCALC 191 (H) 08/17/2020 1358     WEIGHTS: Wt Readings from Last 3 Encounters:  08/13/21 131 lb (59.4 kg)  05/31/21 126 lb 6.4 oz (57.3 kg)  03/27/21 126 lb (57.2 kg)    VITALS: BP 109/70   Pulse 74   Ht '5\' 4"'$  (1.626 m)   Wt 131 lb (59.4 kg)   SpO2 99%   BMI 22.49 kg/m   EXAM: General appearance: alert and no distress Neck: no carotid bruit, no JVD and thyroid not enlarged, symmetric, no tenderness/mass/nodules Lungs: diminished breath sounds bibasilar Heart: regular rate and rhythm Abdomen: soft, non-tender; bowel sounds normal; no masses,  no organomegaly Extremities: extremities normal, atraumatic, no cyanosis or edema Pulses: 2+ and symmetric Skin: Skin color, texture, turgor normal. No rashes or lesions Neurologic: Grossly normal Psych: Pleasant  EKG: Normal sinus rhythm with sinus arrhythmia at 74, possible left atrial enlargement-personally reviewed  ASSESSMENT: History of chest pain-abnormal CT FFR suggesting flow-limiting stenosis of the mid to distal circumflex, however cardiac catheterization in 03/2020 demonstrated mild nonobstructive disease of the mid circumflex and proximal LAD. Mild nonobstructive coronary disease by cath  (2010) -40% mid circumflex lesion Dyslipidemia-statin intolerant Emphysema Subcarinal lymphadenopathy  PLAN: 1.   Wendy Patrick has not had any recent anginal symptoms.  Cath last year showed mild to moderate nonobstructive coronary disease although CT FFR was more significant.  Is not clear that she is having anginal symptoms however her cholesterol is quite elevated.  LDL was over 190 in November of last year.  I like to repeat lipids today.  If she remains elevated, would pursue further lipid lowering.  She cannot tolerate statins and had issues with PCSK9 inhibitors previously.  She may be a good candidate for Leqvio and will have to see if her care would cover that.  Plan follow-up in about 6 months.  Pixie Casino, MD, [facility], [place] Director of the Advanced Lipid Disorders &  Cardiovascular Risk Reduction Clinic Diplomate of the American Board of Clinical Lipidology Attending Cardiologist  Direct Dial: [phone]  Fax: [phone]  Website:  [email or web address] 08/13/2021, 10:06 AM

## 2021-08-20 ENCOUNTER — Ambulatory Visit: Payer: Medicare HMO | Admitting: Neurology

## 2021-08-20 ENCOUNTER — Encounter: Payer: Self-pay | Admitting: Neurology

## 2021-08-20 VITALS — BP 132/81 | HR 82 | Ht 64.0 in | Wt 129.0 lb

## 2021-08-20 DIAGNOSIS — R69 Illness, unspecified: Secondary | ICD-10-CM | POA: Diagnosis not present

## 2021-08-20 DIAGNOSIS — F418 Other specified anxiety disorders: Secondary | ICD-10-CM

## 2021-08-20 DIAGNOSIS — M542 Cervicalgia: Secondary | ICD-10-CM

## 2021-08-20 DIAGNOSIS — R269 Unspecified abnormalities of gait and mobility: Secondary | ICD-10-CM

## 2021-08-20 DIAGNOSIS — M5412 Radiculopathy, cervical region: Secondary | ICD-10-CM | POA: Diagnosis not present

## 2021-08-20 DIAGNOSIS — R413 Other amnesia: Secondary | ICD-10-CM | POA: Diagnosis not present

## 2021-08-20 DIAGNOSIS — G501 Atypical facial pain: Secondary | ICD-10-CM

## 2021-08-20 DIAGNOSIS — R208 Other disturbances of skin sensation: Secondary | ICD-10-CM

## 2021-08-20 DIAGNOSIS — R5383 Other fatigue: Secondary | ICD-10-CM

## 2021-08-20 MED ORDER — DULOXETINE HCL 30 MG PO CPEP: 30.0000 mg | ORAL_CAPSULE | Freq: Every day | ORAL | 5 refills | Status: DC

## 2021-08-20 NOTE — Progress Notes (Signed)
Marland Kitchen   [facility]  PATIENT: Wendy Patrick DOB: January 01, 1946                       HISTORICAL  CHIEF COMPLAINT:  Chief Complaint  Patient presents with   Follow-up    RM 2, with husband. C/o neck pain. C/o low energy. Facial pain is stable. L eye has pain.    HISTORY OF PRESENT ILLNESS:   Update 08/20/2021: She reports right facial pain is milder and less bothersome.   She has pain near the left eye which is more recent  She also has a cataract on that side.        Neck pain increased and was most bothersome 2 months ago.   It sometimes enters the left arm.  It is still bad though ROM has improved some.     She notes urinary urgency/frequency as well as hesitancy at times.    Gait is mildly worse.    CT coronary fraction showed LCx stenosis  Recently she had a cardiac catheter showing moderate LCx stenosis that was nonobstructive and mild LAD stenosis.   She is on ASA.    She has had a rash x 2 months on her back.   A steroid/anti-fungal cream cleared it up.    She has fatigue/ reduced energy.  She feels depressed and has some apathy.  She has noted short term memory issues (others have noted mild issues but she is nore concerned than others about it).  Sometimes a hint helps and sometimes it does not.      MRI of the brain 10/17/2018 showed mild white matter changes c/w small vessel ischemic change and no issue along the 5th nerve.  The CRP, ESR and lipid panel were fine.   Abd CT 2019 was reviewed and shows mild L4L5 and L5S1 DJD but no definite nerve root compression.   No spinal stenosis.     ANA, CK were negative 05/31/2021  REVIEW OF SYSTEMS:  Constitutional: No fevers, chills, sweats, or change in appetite.   Decreased energy is noted Eyes: No visual changes, double vision, eye pain Ear, nose and throat: No hearing loss, ear pain, nasal congestion, sore throat.   She has a spinning sensation and tinnitus.   Cardiovascular: No chest pain,  palpitations Respiratory:  No shortness of breath at rest or with exertion.   No wheezes GastrointestinaI: No nausea, vomiting, diarrhea, abdominal pain, fecal incontinence Genitourinary:  as above. Musculoskeletal:  No neck pain, back pain Integumentary: No rash, pruritus, skin lesions Neurological: as above Psychiatric: No depression at this time.  No anxiety Endocrine: No palpitations, diaphoresis, change in appetite, change in weigh or increased thirst Hematologic/Lymphatic:  No anemia, purpura, petechiae. Allergic/Immunologic: No itchy/runny eyes, nasal congestion, recent allergic reactions, rashes  ALLERGIES: Allergies  Allergen Reactions   Bevespi Aerosphere [Glycopyrrolate-Formoterol] Other (See Comments)    Patient felt like her throat was closing up   Statins     Leg cramps   Crestor [Rosuvastatin] Other (See Comments)    Leg cramps   Lamotrigine Rash   Latex Rash    HOME MEDICATIONS: Outpatient Medications Prior to Visit  Medication Sig Dispense Refill   albuterol (VENTOLIN HFA) 108 (90 Base) MCG/ACT inhaler Inhale 2 puffs into the lungs every 4 (four) hours as needed for wheezing or shortness of breath. 18 g 11   Ascorbic Acid (VITAMIN C) 1000 MG tablet Take 1,000 mg by mouth daily.  azelastine (ASTELIN) 0.1 % nasal spray Place 2 sprays into both nostrils 2 (two) times daily. Use in each nostril as directed 30 mL 12   cholecalciferol (VITAMIN D) 1000 units tablet Take 10,000 Units by mouth daily. Vitamin d 3     fluticasone (FLONASE) 50 MCG/ACT nasal spray SPRAY TWO SPRAYS IN EACH NOSTRIL ONCE DAILY 16 mL 4   KRILL OIL PO Take 250 mg by mouth daily.     Loratadine 10 MG CAPS Take 10 mg by mouth daily.     Multiple Vitamins-Minerals (CENTRUM SILVER PO) Take by mouth daily at 2 PM.     nystatin-triamcinolone ointment (MYCOLOG) Apply 1 application topically 2 (two) times daily. 100 g 0   pantoprazole (PROTONIX) 40 MG tablet TAKE ONE TABLET BY MOUTH DAILY 30 tablet 6    aspirin EC 81 MG tablet Take 81 mg by mouth. Takes 2 x week     umeclidinium-vilanterol (ANORO ELLIPTA) 62.5-25 MCG/INH AEPB Inhale 1 puff into the lungs daily. 1 each 11   No facility-administered medications prior to visit.    PAST MEDICAL HISTORY: Past Medical History:  Diagnosis Date   AIN grade I    COPD (chronic obstructive pulmonary disease) ([place])    Coronary artery disease 08/2009   40% mid left circ by cath in [place], [place]   Dyspnea    with heavy exertion   Endometriosis    1986   Endometriosis yrs ago   Family history of adverse reaction to anesthesia    sister ponv   GERD (gastroesophageal reflux disease)    Granuloma annulare    Gum lesion    per pt left jawborn has small knot on gum on back tooth, is not painful or draining   Headache    History of gestational diabetes many  yrs ago   History of hiatal hernia    slight hh   History of kidney stones 02/14/2011   passed on own   History of migraine yrs ago   Hyperlipidemia    Optic neuritis    February 14, 2014   Optic neuritis Novembr   Trigeminal neuralgia    Urinary urgency    Vaginal anomaly    tears and bleeds easily    PAST SURGICAL HISTORY: Past Surgical History:  Procedure Laterality Date   ABDOMINAL HYSTERECTOMY  age 29   CARDIAC CATHETERIZATION  02-14-09   COLONOSCOPY  12/2019   3 polyps removed 1 pre cancer   EXCISION OF SKIN TAG N/A 02/14/2021   Procedure: EXCISION OF ANAL LESIONS;  Surgeon: Leighton Ruff, MD;  Location: [facility];  Service: General;  Laterality: N/A;  25 MIN TOTAL   EYE SURGERY Bilateral 02/15/16   both eyes cataracts lens replacement   LEFT HEART CATH AND CORONARY ANGIOGRAPHY N/A 04/17/2020   Procedure: LEFT HEART CATH AND CORONARY ANGIOGRAPHY;  Surgeon: Sherren Mocha, MD;  Location: [facility];  Service: Cardiovascular;  Laterality: N/A;   MANDIBLE SURGERY     gum tumor removed in [place] not sure if was benign per pt   MINOR HEMORRHOIDECTOMY  yrs ago   RECTAL  EXAM UNDER ANESTHESIA N/A 02/14/2021   Procedure: ANAL EXAM UNDER ANESTHESIA;  Surgeon: Leighton Ruff, MD;  Location: [facility];  Service: General;  Laterality: N/A;   TUBAL LIGATION  yrs ago   UPPER GASTROINTESTINAL ENDOSCOPY  12/2019    FAMILY HISTORY: Family History  Problem Relation Age of Onset   Cancer Mother  Breast    Breast cancer Mother    Stroke Brother        open heart surgery    Colon cancer Neg Hx    Esophageal cancer Neg Hx    Rectal cancer Neg Hx    Stomach cancer Neg Hx     SOCIAL HISTORY:  Social History   Socioeconomic History   Marital status: Married    Spouse name: Iona Beard   Number of children: 1   Years of education: GED   Highest education level: Not on file  Occupational History   Occupation: Retired  Tobacco Use   Smoking status: Former    Packs/day: 2.00    Years: 40.00    Pack years: 80.00    Types: Cigarettes    Quit date: 02/05/1996    Years since quitting: 25.5   Smokeless tobacco: Never   Tobacco comments:    Educated LDCT for lung Ca to discuss with MD if interested  Vaping Use   Vaping Use: Never used  Substance and Sexual Activity   Alcohol use: Yes    Alcohol/week: 3.0 standard drinks    Types: 3 Glasses of wine per week    Comment: wine with dinner occ   Drug use: No   Sexual activity: Never    Partners: Male  Other Topics Concern   Not on file  Social History Narrative   Lives w/ husband    Caffeine use: Coffee daily   Right handed    Social Determinants of Health   Financial Resource Strain: Not on file  Food Insecurity: Not on file  Transportation Needs: Not on file  Physical Activity: Not on file  Stress: Not on file  Social Connections: Not on file  Intimate Partner Violence: Not on file     PHYSICAL EXAM  Vitals:   08/20/21 0817  BP: 132/81  Pulse: 82  Weight: 129 lb (58.5 kg)  Height: _0  (1.626 m)    Body mass index is 22.14 kg/m.   General: The patient is  well-developed and well-nourished and in no acute distress   Neck: The neck has good ROM.  The neck is slightly tender in the upper cervical paraspinal muscles.  Range of motion is mildly reduced..  She has a little bit of tenderness over the trochanteric bursae in the piriformis muscles..   No lumbar tenderness  Neurologic Exam  Mental status: The patient is alert and oriented x 3 at the time of the examination. The patient has apparent normal recent and remote memory, with an apparently normal attention span and concentration ability.   Speech is normal.  Cranial nerves: Extraocular movements are full.   She reports reduced V2 temperature sensation in right face.   Trapezius and sternocleidomastoid strength is normal. No dysarthria is noted.  No obvious hearing deficits are noted.  Motor:  Muscle bulk and tone are normal. Strength is  5 / 5 in all 4 extremities.   Sensory: Sensory testing is intact to touch in all 4 extremities.   Gait and station: Station and gait are normal. Tandem gait is mildly wide.  No significant retropulsion.. Romberg is negative.      Reflexes: Deep tendon reflexes are symmetric and normal bilaterally.      ASSESSMENT AND PLAN  Neck pain - Plan: MR CERVICAL SPINE WO CONTRAST  Cervical radiculopathy - Plan: MR CERVICAL SPINE WO CONTRAST  Gait disturbance - Plan: MR CERVICAL SPINE WO CONTRAST  Atypical facial pain - Plan: Sedimentation  rate, C-reactive protein  Dysesthesia  Depression with anxiety  Memory loss - Plan: Thyroid Panel With TSH  Other fatigue - Plan: Sedimentation rate, Thyroid Panel With TSH, C-reactive protein   1.    The right facial pain has improved but now she is reporting pain near the left eye.  She also reports that neck pain has been much worse.  Sometimes she gets radicular pain down the left arm.  We will check an MRI of the cervical spine.  Romberg and also check blood work for ESR, CRP.  2.   Remain active and exercise as  tolerated.   3.   She appears to have a depression that may be playing some role in her reduced memory and perhaps other symptoms.  I will have her start duloxetine 30 mg.  She was unable to tolerate 60 mg in the past.  We will also check TSH 4.  Return as needed if there are new or worsening neurologic symptoms.  42-minute office visit with the majority of the time spent face-to-face for history and physical, discussion/counseling and decision-making.  Additional time with record review and documentation.   Maxie Debose A. Felecia Shelling, MD, PhD 0/01/9846, 3:08 AM Certified in Neurology, Clinical Neurophysiology, Sleep Medicine, Pain Medicine and Neuroimaging  [facility] [address] [place], [place] [address] [phone]

## 2021-08-21 LAB — C-REACTIVE PROTEIN: CRP: <1 mg/L (ref 0–10)

## 2021-08-21 LAB — SEDIMENTATION RATE: Sed Rate: 4 mm/hr (ref 0–40)

## 2021-08-21 LAB — THYROID PANEL WITH TSH
Free Thyroxine Index: 2.3 (ref 1.2–4.9)
T3 Uptake Ratio: 27 % (ref 24–39)
T4, Total: 8.5 ug/dL (ref 4.5–12.0)
TSH: 1.5 u[IU]/mL (ref 0.450–4.500)

## 2021-08-26 ENCOUNTER — Telehealth: Payer: Self-pay | Admitting: Internal Medicine

## 2021-08-26 NOTE — Telephone Encounter (Signed)
[employer] portal enrollment for benefits investigation submitted

## 2021-08-28 ENCOUNTER — Telehealth: Payer: Self-pay | Admitting: Internal Medicine

## 2021-08-28 NOTE — Telephone Encounter (Signed)
Pt c/o medication issue:  1. Name of Medication: patient not sure  2. How are you currently taking this medication (dosage and times per day)? No   3. Are you having a reaction (difficulty breathing--STAT)? No   4. What is your medication issue? Patient was calling concering a new medication. She is not sure of the name. Patient calling to get a e-mail that was sent to her she lost it.

## 2021-08-28 NOTE — Telephone Encounter (Signed)
The patient was calling to get information on the [employer]. She stated that she thought she was supposed to call the company for assistance but was unsure. She said that a MyChart message can be sent.

## 2021-08-28 NOTE — Telephone Encounter (Signed)
MyChart message sent to patient.

## 2021-08-29 ENCOUNTER — Other Ambulatory Visit: Payer: Self-pay | Admitting: Critical Care Medicine

## 2021-08-31 ENCOUNTER — Ambulatory Visit
Admission: RE | Admit: 2021-08-31 | Discharge: 2021-08-31 | Disposition: A | Discharge status: Home / Self Care | Payer: Medicare HMO | Source: Ambulatory Visit | Attending: Neurology | Admitting: Neurology

## 2021-08-31 ENCOUNTER — Other Ambulatory Visit: Payer: Self-pay

## 2021-08-31 DIAGNOSIS — M5412 Radiculopathy, cervical region: Secondary | ICD-10-CM | POA: Diagnosis not present

## 2021-08-31 DIAGNOSIS — R269 Unspecified abnormalities of gait and mobility: Secondary | ICD-10-CM | POA: Diagnosis not present

## 2021-08-31 DIAGNOSIS — M542 Cervicalgia: Secondary | ICD-10-CM | POA: Diagnosis not present

## 2021-09-16 ENCOUNTER — Other Ambulatory Visit: Payer: Self-pay | Admitting: Critical Care Medicine

## 2021-10-06 ENCOUNTER — Other Ambulatory Visit: Payer: Self-pay | Admitting: Pulmonary Disease

## 2021-11-06 DIAGNOSIS — H0102B Squamous blepharitis left eye, upper and lower eyelids: Secondary | ICD-10-CM | POA: Diagnosis not present

## 2021-11-06 DIAGNOSIS — H10413 Chronic giant papillary conjunctivitis, bilateral: Secondary | ICD-10-CM | POA: Diagnosis not present

## 2021-11-06 DIAGNOSIS — H35372 Puckering of macula, left eye: Secondary | ICD-10-CM | POA: Diagnosis not present

## 2021-11-06 DIAGNOSIS — H40013 Open angle with borderline findings, low risk, bilateral: Secondary | ICD-10-CM | POA: Diagnosis not present

## 2021-11-06 DIAGNOSIS — Z961 Presence of intraocular lens: Secondary | ICD-10-CM | POA: Diagnosis not present

## 2021-11-06 DIAGNOSIS — H26492 Other secondary cataract, left eye: Secondary | ICD-10-CM | POA: Diagnosis not present

## 2021-11-06 DIAGNOSIS — H0102A Squamous blepharitis right eye, upper and lower eyelids: Secondary | ICD-10-CM | POA: Diagnosis not present

## 2021-11-06 DIAGNOSIS — H469 Unspecified optic neuritis: Secondary | ICD-10-CM | POA: Diagnosis not present

## 2021-11-15 ENCOUNTER — Encounter: Payer: Self-pay | Admitting: Neurology

## 2021-11-18 ENCOUNTER — Encounter: Payer: Self-pay | Admitting: *Deleted

## 2021-11-18 ENCOUNTER — Other Ambulatory Visit: Payer: Self-pay | Admitting: *Deleted

## 2021-11-19 ENCOUNTER — Other Ambulatory Visit: Payer: Self-pay | Admitting: Critical Care Medicine

## 2022-01-13 ENCOUNTER — Telehealth: Payer: Self-pay | Admitting: Internal Medicine

## 2022-01-13 NOTE — Telephone Encounter (Signed)
Patient calling in  Patient says Dr. Debara Pickett scheduled her to have MRI done.. & results showed a "gold ball" sized image near breast area but not sure what it is  Says she contacted breast imaging center & they advised her to contact PCP for referral to have imaging done of breast to examine   Please let patient know when referral has been completed [phone]

## 2022-01-13 NOTE — Telephone Encounter (Signed)
See below

## 2022-01-13 NOTE — Telephone Encounter (Signed)
We will need the results of this imaging so we can appropriately assess.

## 2022-01-17 NOTE — Telephone Encounter (Signed)
See my chart message

## 2022-02-11 NOTE — Progress Notes (Signed)
Office Visit    Patient Name: Wendy Patrick Date of Encounter: 02/12/2022  Primary Care Provider:  Myrlene Broker, MD Primary Cardiologist:  Chrystie Nose, MD  Chief Complaint    76 year old female with a history of CAD, hyperlipidemia with statin intolerance, COPD, chronic back pain, depression, and anxiety who presents for follow-up related to CAD.   Past Medical History    Past Medical History:  Diagnosis Date   AIN grade I    COPD (chronic obstructive pulmonary disease) ([facility])    Coronary artery disease 08/2009   40% mid left circ by cath in [place], [place]   Dyspnea    with heavy exertion   Endometriosis    1986   Endometriosis yrs ago   Family history of adverse reaction to anesthesia    sister ponv   GERD (gastroesophageal reflux disease)    Granuloma annulare    Gum lesion    per pt left jawborn has small knot on gum on back tooth, is not painful or draining   Headache    History of gestational diabetes many  yrs ago   History of hiatal hernia    slight hh   History of kidney stones December 20, 2010   passed on own   History of migraine yrs ago   Hyperlipidemia    Optic neuritis    12-20-2013   Optic neuritis Novembr   Trigeminal neuralgia    Urinary urgency    Vaginal anomaly    tears and bleeds easily   Past Surgical History:  Procedure Laterality Date   ABDOMINAL HYSTERECTOMY  age 69   CARDIAC CATHETERIZATION  2010   COLONOSCOPY  21-Dec-2019   3 polyps removed 1 pre cancer   EXCISION OF SKIN TAG N/A 02/14/2021   Procedure: EXCISION OF ANAL LESIONS;  Surgeon: Romie Levee, MD;  Location: [facility];  Service: General;  Laterality: N/A;  45 MIN TOTAL   EYE SURGERY Bilateral 12/21/2015   both eyes cataracts lens replacement   LEFT HEART CATH AND CORONARY ANGIOGRAPHY N/A 04/17/2020   Procedure: LEFT HEART CATH AND CORONARY ANGIOGRAPHY;  Surgeon: Tonny Bollman, MD;  Location: [facility];  Service: Cardiovascular;  Laterality: N/A;   MANDIBLE  SURGERY     gum tumor removed in [place] not sure if was benign per pt   MINOR HEMORRHOIDECTOMY  yrs ago   RECTAL EXAM UNDER ANESTHESIA N/A 02/14/2021   Procedure: ANAL EXAM UNDER ANESTHESIA;  Surgeon: Romie Levee, MD;  Location: [facility];  Service: General;  Laterality: N/A;   TUBAL LIGATION  yrs ago   UPPER GASTROINTESTINAL ENDOSCOPY  12/21/19    Allergies  Allergies  Allergen Reactions   Bevespi Aerosphere [Glycopyrrolate-Formoterol] Other (See Comments)    Patient felt like her throat was closing up   Duloxetine Diarrhea   Statins     Leg cramps   Crestor [Rosuvastatin] Other (See Comments)    Leg cramps   Lamotrigine Rash   Latex Rash    History of Present Illness    76 year old female with the above past medical history including CAD, hyperlipidemia with statin intolerance, COPD, chronic back pain, depression, and anxiety.   Previously followed by Dr. Mayford Knife, later switched to Dr. Rennis Golden. Cardiac catheterization in 2008-12-20 showed 40% stenosis in the left circumflex artery, otherwise, no significant CAD.  She is intolerant to statins due to leg cramps.  Previously managed on Repatha, however this was later discontinued due to side effects.  Myoview in 2018 was negative.  She underwent coronary CTA in May 2021 in the setting of recurrent chest pain, which revealed mild to moderate mixed CAD of LAD and RCA, calcium score was 252, FFR was significant for mid to distal left circumflex stenosis.  Cardiac catheterization in 2021 showed nonobstructive 40% stenosis in the proximal to mid LAD, moderate nonobstructive 50% disease in the left circumflex artery.  Medical therapy was recommended. She was last seen in the office on 08/13/2021 and was stable overall from a cardiac standpoint, she did report some mild left-sided pain over the lower ribs at rest. She was started on Leqvio for management of hyperlipidemia.   She presents today for follow-up accompanied by her  husband. Since her last visit she has been stable overall from a cardiac standpoint, though she does report ongoing intermittent chest discomfort which she describes as a cramping in her mid to left center of her chest that sometimes radiates to her left arm.  Her symptoms occur mostly at rest, particularly at night while sleeping, and last for seconds to minutes-no associated symptoms. She reports occasional lightheadedness with positional changes, denies presyncope, syncope.  She also reports occasional sensation of fullness in her head in the setting of elevated BP, however, she states elevated BP for her is 130/80. Her biggest concern today is her ongoing chest discomfort.   Home Medications    Current Outpatient Medications  Medication Sig Dispense Refill   albuterol (VENTOLIN HFA) 108 (90 Base) MCG/ACT inhaler INHALE 2 PUFFS  INTO LUNGS EVERY 4 HOURS AS NEEDED FOR SHORTNESS OF BREATH OR WHEEZING 18 g 6   Ascorbic Acid (VITAMIN C) 1000 MG tablet Take 1,000 mg by mouth daily.     Azelastine HCl 137 MCG/SPRAY SOLN SPRAY TWO SPRAYS IN EACH NOSTRIL TWICE DAILY. USE IN EACH NOSTRIL AS DIRECTED 30 mL 12   cholecalciferol (VITAMIN D) 1000 units tablet Take 10,000 Units by mouth daily. Vitamin d 3     fluticasone (FLONASE) 50 MCG/ACT nasal spray SPRAY TWO SPRAYS IN EACH NOSTRIL ONCE DAILY 16 mL 4   KRILL OIL PO Take 250 mg by mouth daily.     Loratadine 10 MG CAPS Take 10 mg by mouth daily.     Multiple Vitamins-Minerals (CENTRUM SILVER PO) Take by mouth daily at 2 PM.     nystatin-triamcinolone ointment (MYCOLOG) Apply 1 application topically 2 (two) times daily. 100 g 0   pantoprazole (PROTONIX) 40 MG tablet TAKE ONE TABLET BY MOUTH DAILY 90 tablet 1   umeclidinium-vilanterol (ANORO ELLIPTA) 62.5-25 MCG/ACT AEPB INHALE 1 PUFF INTO THE LUNGS DAILY 60 each 5   No current facility-administered medications for this visit.     Review of Systems    She denies chest pain, palpitations, dyspnea, pnd,  orthopnea, n, v, dizziness, syncope, edema, weight gain, or early satiety. All other systems reviewed and are otherwise negative except as noted above.   Physical Exam    VS:  BP 120/60 (BP Location: Left Arm, Patient Position: Sitting, Cuff Size: Normal)   Pulse 65   Ht 5\' 4"  (1.626 m)   Wt 129 lb (58.5 kg)   SpO2 96%   BMI 22.14 kg/m   GEN: Well nourished, well developed, in no acute distress. HEENT: normal. Neck: Supple, no JVD, carotid bruits, or masses. Cardiac: RRR, no murmurs, rubs, or gallops. No clubbing, cyanosis, edema.  Radials/DP/PT 2+ and equal bilaterally.  Respiratory:  Respirations regular and unlabored, clear to auscultation bilaterally. GI: Soft, nontender,  nondistended, BS + x 4. MS: no deformity or atrophy. Skin: warm and dry, no rash. Neuro:  Strength and sensation are intact. Psych: Normal affect.  Accessory Clinical Findings    ECG personally reviewed by me today - NSR, 65 bpm - no acute changes.  Lab Results  Component Value Date   WBC 6.5 04/13/2020   HGB 15.6 (H) 02/14/2021   HCT 46.0 02/14/2021   MCV 84 04/13/2020   PLT 375 04/13/2020   Lab Results  Component Value Date   CREATININE 0.90 02/14/2021   BUN 22 02/14/2021   NA 141 02/14/2021   K 4.8 02/14/2021   CL 104 02/14/2021   CO2 29 11/27/2020   Lab Results  Component Value Date   ALT 11 08/03/2019   AST 18 08/03/2019   ALKPHOS 86 08/03/2019   BILITOT 0.3 08/03/2019   Lab Results  Component Value Date   CHOL 263 (H) 08/13/2021   HDL 60 08/13/2021   LDLCALC 165 (H) 08/13/2021   TRIG 205 (H) 08/13/2021   CHOLHDL 4.4 08/13/2021    Lab Results  Component Value Date   HGBA1C 6.1 06/05/2016    Assessment & Plan   1. CAD/chest pain: Cath in 2021 showed nonobstructive 40% stenosis in the proximal to mid LAD, moderate nonobstructive 50% disease in the left circumflex artery; medical therapy was advised.  She reports ongoing left-sided chest discomfort which she describes as a  cramping, with occasional radiation to her left arm, that lasts for seconds to minutes and occurs mostly at rest particularly while sleeping at night.  She notes 2 episodes of this in the last 2 weeks.  Symptoms are somewhat atypical similar to her prior symptoms at the time of her previous ischemic evaluations. She denies exertional symptoms, denies worsening dyspnea.  However, she is concerned that her symptoms could be coming from her heart.  Will trial Imdur 15 mg daily.  Discussed options for repeat ischemic evaluation and through shared decision-making, will pursue Lexiscan Myoview for further risk stratification.   Shared Decision Making/Informed Consent The risks [chest pain, shortness of breath, cardiac arrhythmias, dizziness, blood pressure fluctuations, myocardial infarction, stroke/transient ischemic attack, nausea, vomiting, allergic reaction, radiation exposure, metallic taste sensation and life-threatening complications (estimated to be 1 in 10,000)], benefits (risk stratification, diagnosing coronary artery disease, treatment guidance) and alternatives of a nuclear stress test were discussed in detail with Ms. Dorazio and she agrees to proceed.  2. Hyperlipidemia: Statin intolerant. She did not tolerate Repatha. LDL was 165 in 08/2021. Leqvio was recommended but never started. She is currently not on any medications for cholesterol management. Will repeat lipid panel and have her follow-up lipid clinic Pharm.D. for further recommendations for cholesterol management going forward given history of CAD.  3. COPD/OSA: Suspected sleep apnea per pulmonology. Denies worsening dyspnea. Recommend routine follow-up with pulmonology.  4. Disposition: Follow-up in 6 to 8 weeks.  Joylene Grapes, NP 02/12/2022, 8:46 AM

## 2022-02-12 ENCOUNTER — Other Ambulatory Visit: Payer: Self-pay

## 2022-02-12 ENCOUNTER — Encounter: Payer: Self-pay | Admitting: Nurse Practitioner

## 2022-02-12 ENCOUNTER — Ambulatory Visit: Payer: Medicare HMO | Admitting: Nurse Practitioner

## 2022-02-12 VITALS — BP 120/60 | HR 65 | Ht 64.0 in | Wt 129.0 lb

## 2022-02-12 DIAGNOSIS — G4733 Obstructive sleep apnea (adult) (pediatric): Secondary | ICD-10-CM | POA: Diagnosis not present

## 2022-02-12 DIAGNOSIS — R079 Chest pain, unspecified: Secondary | ICD-10-CM | POA: Diagnosis not present

## 2022-02-12 DIAGNOSIS — J449 Chronic obstructive pulmonary disease, unspecified: Secondary | ICD-10-CM | POA: Diagnosis not present

## 2022-02-12 DIAGNOSIS — Z79899 Other long term (current) drug therapy: Secondary | ICD-10-CM

## 2022-02-12 DIAGNOSIS — E785 Hyperlipidemia, unspecified: Secondary | ICD-10-CM | POA: Diagnosis not present

## 2022-02-12 DIAGNOSIS — I2510 Atherosclerotic heart disease of native coronary artery without angina pectoris: Secondary | ICD-10-CM | POA: Diagnosis not present

## 2022-02-12 MED ORDER — ISOSORBIDE MONONITRATE ER 30 MG PO TB24: 15.0000 mg | ORAL_TABLET | Freq: Every day | ORAL | 3 refills | Status: DC

## 2022-02-12 NOTE — Patient Instructions (Addendum)
Medication Instructions:  ?Start Imdur 15 mg daily  ?*If you need a refill on your cardiac medications before your next appointment, please call your pharmacy* ? ? ?Lab Work: ?Your physician recommends that you return for lab work in 1-2 weeks ?Fasting Lipid ? ?If you have labs (blood work) drawn today and your tests are completely normal, you will receive your results only by: ?MyChart Message (if you have MyChart) OR ?A paper copy in the mail ?If you have any lab test that is abnormal or we need to change your treatment, we will call you to review the results. ? ? ?Testing/Procedures: ?Your physician has requested that you have a lexiscan myoview. For further information please visit [email or web address]. Please follow instruction sheet, as given.  ? ? ?Follow-Up: ?At [facility], you and your health needs are our priority.  As part of our continuing mission to provide you with exceptional heart care, we have created designated Provider Care Teams.  These Care Teams include your primary Cardiologist (physician) and Advanced Practice Providers (APPs -  Physician Assistants and Nurse Practitioners) who all work together to provide you with the care you need, when you need it. ? ?We recommend signing up for the patient portal called "MyChart".  Sign up information is provided on this After Visit Summary.  MyChart is used to connect with patients for Virtual Visits (Telemedicine).  Patients are able to view lab/test results, encounter notes, upcoming appointments, etc.  Non-urgent messages can be sent to your provider as well.   ?To learn more about what you can do with MyChart, go to [email or web address].   ? ?Your next appointment:   ?2-3 month(s) ?Next available  ?The format for your next appointment:   ?In Person ? ?Provider:   ?Diona Browner, NP      ?Pharm D  ? ?Other Instructions ?See [facility] soon  ? ? ?

## 2022-02-13 ENCOUNTER — Telehealth (HOSPITAL_COMMUNITY): Payer: Self-pay | Admitting: *Deleted

## 2022-02-13 ENCOUNTER — Telehealth: Payer: Self-pay | Admitting: *Deleted

## 2022-02-13 NOTE — Telephone Encounter (Signed)
Received call from patient- patient state she was started on isosorbide yesterday at [place].  She took the first dose of this yesterday evening and had the "worst headache of her life".  She reports having to take tylenol this morning due to headache.  She is scheduled for her stress test tomorrow.  Due to severe headache, she was wondering if she should take the imdur today.   ? ?Advised ok to hold imdur-will route to NP to review.   ? ? ?

## 2022-02-13 NOTE — Telephone Encounter (Signed)
Close encounter 

## 2022-02-13 NOTE — Telephone Encounter (Signed)
Lenna Sciara, NP ?to Me   ?   1:00 PM ?Okay to hold Imdur.  We can reevaluate after stress test.  Thank you.  ? ? ?Patient aware and verbalized understanding. ?

## 2022-02-14 ENCOUNTER — Ambulatory Visit (HOSPITAL_COMMUNITY)
Admission: RE | Admit: 2022-02-14 | Discharge: 2022-02-14 | Disposition: A | Discharge status: Home / Self Care | Payer: Medicare HMO | Source: Ambulatory Visit | Attending: Internal Medicine | Admitting: Internal Medicine

## 2022-02-14 ENCOUNTER — Other Ambulatory Visit: Payer: Self-pay

## 2022-02-14 DIAGNOSIS — R079 Chest pain, unspecified: Secondary | ICD-10-CM | POA: Diagnosis not present

## 2022-02-14 LAB — MYOCARDIAL PERFUSION IMAGING
Base ST Depression (mm): 0 mm
LV dias vol: 56 mL (ref 46–106)
LV sys vol: 17 mL
Nuc Stress EF: 69 %
Peak HR: 103 {beats}/min
Rest HR: 75 {beats}/min
Rest Nuclear Isotope Dose: 10.6 mCi
SDS: 0
SRS: 1
SSS: 1
ST Depression (mm): 0 mm
Stress Nuclear Isotope Dose: 30.2 mCi
TID: 0.99

## 2022-02-14 MED ORDER — REGADENOSON 0.4 MG/5ML IV SOLN
0.4000 mg | Freq: Once | INTRAVENOUS | Status: AC
Start: 2022-02-14 — End: 2022-02-14
  Administered 2022-02-14: 0.4 mg via INTRAVENOUS

## 2022-02-14 MED ORDER — TECHNETIUM TC 99M TETROFOSMIN IV KIT
30.2000 | PACK | Freq: Once | INTRAVENOUS | Status: AC | PRN
Start: 2022-02-14 — End: 2022-02-14
  Administered 2022-02-14: 30.2 via INTRAVENOUS
  Filled 2022-02-14: qty 31

## 2022-02-14 MED ORDER — TECHNETIUM TC 99M TETROFOSMIN IV KIT
10.6000 | PACK | Freq: Once | INTRAVENOUS | Status: AC | PRN
Start: 2022-02-14 — End: 2022-02-14
  Administered 2022-02-14: 10.6 via INTRAVENOUS
  Filled 2022-02-14: qty 11

## 2022-02-14 MED ORDER — AMINOPHYLLINE 25 MG/ML IV SOLN
75.0000 mg | Freq: Once | INTRAVENOUS | Status: AC
  Administered 2022-02-14: 75 mg via INTRAVENOUS

## 2022-02-17 ENCOUNTER — Telehealth: Payer: Self-pay

## 2022-02-17 NOTE — Telephone Encounter (Addendum)
Spoke with pt. Pt was notified of results. Pt will follow up as directed.----- Message from Lenna Sciara, NP sent at 02/16/2022  4:22 PM EDT ----- ?Recent stress test was normal. This is good news and indicates that it is very unlikely that her symptoms are coming from her heart. She does not need to continue Imdur as she did not seem to tolerate it. Otherwise, continue current medications and follow-up as planned. Thank you! ?

## 2022-02-19 ENCOUNTER — Telehealth: Payer: Self-pay | Admitting: Neurology

## 2022-02-19 ENCOUNTER — Ambulatory Visit: Payer: Medicare HMO | Admitting: Neurology

## 2022-02-19 ENCOUNTER — Encounter: Payer: Self-pay | Admitting: Neurology

## 2022-02-19 VITALS — BP 142/70 | HR 75 | Ht 64.0 in | Wt 126.0 lb

## 2022-02-19 DIAGNOSIS — M542 Cervicalgia: Secondary | ICD-10-CM

## 2022-02-19 DIAGNOSIS — M5442 Lumbago with sciatica, left side: Secondary | ICD-10-CM | POA: Diagnosis not present

## 2022-02-19 DIAGNOSIS — Z79899 Other long term (current) drug therapy: Secondary | ICD-10-CM | POA: Diagnosis not present

## 2022-02-19 DIAGNOSIS — M5416 Radiculopathy, lumbar region: Secondary | ICD-10-CM | POA: Diagnosis not present

## 2022-02-19 DIAGNOSIS — M5441 Lumbago with sciatica, right side: Secondary | ICD-10-CM

## 2022-02-19 DIAGNOSIS — R69 Illness, unspecified: Secondary | ICD-10-CM | POA: Diagnosis not present

## 2022-02-19 DIAGNOSIS — R413 Other amnesia: Secondary | ICD-10-CM

## 2022-02-19 DIAGNOSIS — G8929 Other chronic pain: Secondary | ICD-10-CM | POA: Diagnosis not present

## 2022-02-19 DIAGNOSIS — R269 Unspecified abnormalities of gait and mobility: Secondary | ICD-10-CM

## 2022-02-19 DIAGNOSIS — F418 Other specified anxiety disorders: Secondary | ICD-10-CM

## 2022-02-19 DIAGNOSIS — R5383 Other fatigue: Secondary | ICD-10-CM | POA: Diagnosis not present

## 2022-02-19 DIAGNOSIS — R079 Chest pain, unspecified: Secondary | ICD-10-CM | POA: Diagnosis not present

## 2022-02-19 LAB — LIPID PANEL
Chol/HDL Ratio: 3.6 ratio (ref 0.0–4.4)
Cholesterol, Total: 262 mg/dL — ABNORMAL HIGH (ref 100–199)
HDL: 72 mg/dL (ref >39)
LDL Chol Calc (NIH): 161 mg/dL — ABNORMAL HIGH (ref 0–99)
Triglycerides: 163 mg/dL — ABNORMAL HIGH (ref 0–149)
VLDL Cholesterol Cal: 29 mg/dL (ref 5–40)

## 2022-02-19 NOTE — Progress Notes (Signed)
. ? ? ?[facility] ? ?PATIENT: Wendy Patrick ?DOB: Oct 31, 1946 ? ?                   ? ? ?HISTORICAL ? ?CHIEF COMPLAINT:  ?Chief Complaint  ?Patient presents with  ? Follow-up  ?  Rm 2, pt with husband. Describes still having complaints with generalized aches/pains back and legs.   ? ? ?HISTORY OF PRESENT ILLNESS:  ? ?Update 02/19/2022: ?She is reporting more lower back and leg pain, much more than her last visit.   Pain is a achy and worse with some positions.  Pain is better laying on right side than on left side ? ?She also reports more neck pain.  Neck pain is axial and sometimes down arms.  She tried a new pilow with only mild benefit.    ?She had chest pain and left arm pain so had a cardiac evaluation that showed no major issue.    She took isosorbide and had a severe headache.  In past, CT coronary fraction showed LCx stenosis.   She had a cardiac catheter showing moderate LCx stenosis that was nonobstructive and mild LAD stenosis.   She is on ASA.   ? ?Due to her various aches, pains and probable depression, I started duloxetine but it caused diarrhea and she stopped.   ?  ?She also feels her memory is worse.  She has fatigue/ reduced energy.  She feels depressed and has some apathy.  She has noted short term memory issues (others have noted mild issues but she is nore concerned than others about it).  Sometimes a hint helps and sometimes it does not.     ? ?MRI of the brain 10/17/2018 showed mild white matter changes c/w small vessel ischemic change and no issue along the 5th nerve.  The CRP, ESR and lipid panel were fine.   Abd CT 2019 was reviewed and shows mild L4L5 and L5S1 DJD but no definite nerve root compression.   No spinal stenosis.   ? ?MRI cervical spine 08/31/2021 showed:  The spinal cord appears normal.      At C5-C6 there are degenerative changes causing mild bilateral foraminal narrowing but no spinal stenosis or nerve root compression.  This has slightly progressed compared  to the 2015 MRI.      No significant degenerative changes at the other cervical spine levels. ?  ?ANA, CK were negative 05/31/2021 ? ?REVIEW OF SYSTEMS:  ?Constitutional: No fevers, chills, sweats, or change in appetite.   Decreased energy is noted ?Eyes: No visual changes, double vision, eye pain ?Ear, nose and throat: No hearing loss, ear pain, nasal congestion, sore throat.   She has a spinning sensation and tinnitus.   ?Cardiovascular: No chest pain, palpitations ?Respiratory:  No shortness of breath at rest or with exertion.   No wheezes ?GastrointestinaI: No nausea, vomiting, diarrhea, abdominal pain, fecal incontinence ?Genitourinary:  as above. ?Musculoskeletal:  No neck pain, back pain ?Integumentary: No rash, pruritus, skin lesions ?Neurological: as above ?Psychiatric: No depression at this time.  No anxiety ?Endocrine: No palpitations, diaphoresis, change in appetite, change in weigh or increased thirst ?Hematologic/Lymphatic:  No anemia, purpura, petechiae. ?Allergic/Immunologic: No itchy/runny eyes, nasal congestion, recent allergic reactions, rashes ? ?ALLERGIES: ?Allergies  ?Allergen Reactions  ? Bevespi Aerosphere [Glycopyrrolate-Formoterol] Other (See Comments)  ?  Patient felt like her throat was closing up  ? Duloxetine Diarrhea  ? Statins   ?  Leg cramps  ? Crestor [Rosuvastatin]  Other (See Comments)  ?  Leg cramps  ? Lamotrigine Rash  ? Latex Rash  ? ? ?HOME MEDICATIONS: ?Outpatient Medications Prior to Visit  ?Medication Sig Dispense Refill  ? albuterol (VENTOLIN HFA) 108 (90 Base) MCG/ACT inhaler INHALE 2 PUFFS  INTO LUNGS EVERY 4 HOURS AS NEEDED FOR SHORTNESS OF BREATH OR WHEEZING 18 g 6  ? Ascorbic Acid (VITAMIN C) 1000 MG tablet Take 1,000 mg by mouth daily.    ? Azelastine HCl 137 MCG/SPRAY SOLN SPRAY TWO SPRAYS IN EACH NOSTRIL TWICE DAILY. USE IN EACH NOSTRIL AS DIRECTED 30 mL 12  ? cholecalciferol (VITAMIN D) 1000 units tablet Take 10,000 Units by mouth daily. Vitamin d 3    ? fluticasone  (FLONASE) 50 MCG/ACT nasal spray SPRAY TWO SPRAYS IN EACH NOSTRIL ONCE DAILY 16 mL 4  ? KRILL OIL PO Take 250 mg by mouth daily.    ? Loratadine 10 MG CAPS Take 10 mg by mouth daily.    ? Multiple Vitamins-Minerals (CENTRUM SILVER PO) Take by mouth daily at 2 PM.    ? umeclidinium-vilanterol (ANORO ELLIPTA) 62.5-25 MCG/ACT AEPB INHALE 1 PUFF INTO THE LUNGS DAILY 60 each 5  ? nystatin-triamcinolone ointment (MYCOLOG) Apply 1 application topically 2 (two) times daily. 100 g 0  ? pantoprazole (PROTONIX) 40 MG tablet TAKE ONE TABLET BY MOUTH DAILY 90 tablet 1  ? ?No facility-administered medications prior to visit.  ? ? ?PAST MEDICAL HISTORY: ?Past Medical History:  ?Diagnosis Date  ? AIN grade I   ? COPD (chronic obstructive pulmonary disease) ([place])   ? Coronary artery disease 08/2009  ? 40% mid left circ by cath in [place], [place]  ? Dyspnea   ? with heavy exertion  ? Endometriosis   ? 1986  ? Endometriosis yrs ago  ? Family history of adverse reaction to anesthesia   ? sister ponv  ? GERD (gastroesophageal reflux disease)   ? Granuloma annulare   ? Gum lesion   ? per pt left jawborn has small knot on gum on back tooth, is not painful or draining  ? Headache   ? History of gestational diabetes many  yrs ago  ? History of hiatal hernia   ? slight hh  ? History of kidney stones 2011/02/15  ? passed on own  ? History of migraine yrs ago  ? Hyperlipidemia   ? Optic neuritis   ? February 14, 2014  ? Optic neuritis Novembr  ? Trigeminal neuralgia   ? Urinary urgency   ? Vaginal anomaly   ? tears and bleeds easily  ? ? ?PAST SURGICAL HISTORY: ?Past Surgical History:  ?Procedure Laterality Date  ? ABDOMINAL HYSTERECTOMY  age 63  ? CARDIAC CATHETERIZATION  2009/02/14  ? COLONOSCOPY  12/2019  ? 3 polyps removed 1 pre cancer  ? EXCISION OF SKIN TAG N/A 02/14/2021  ? Procedure: EXCISION OF ANAL LESIONS;  Surgeon: Leighton Ruff, MD;  Location: [facility];  Service: General;  Laterality: N/A;  45 MIN TOTAL  ? EYE SURGERY Bilateral 15-Feb-2016  ?  both eyes cataracts lens replacement  ? LEFT HEART CATH AND CORONARY ANGIOGRAPHY N/A 04/17/2020  ? Procedure: LEFT HEART CATH AND CORONARY ANGIOGRAPHY;  Surgeon: Sherren Mocha, MD;  Location: [facility];  Service: Cardiovascular;  Laterality: N/A;  ? MANDIBLE SURGERY    ? gum tumor removed in [place] not sure if was benign per pt  ? MINOR HEMORRHOIDECTOMY  yrs ago  ? RECTAL EXAM UNDER ANESTHESIA N/A 02/14/2021  ?  Procedure: ANAL EXAM UNDER ANESTHESIA;  Surgeon: Leighton Ruff, MD;  Location: [facility];  Service: General;  Laterality: N/A;  ? TUBAL LIGATION  yrs ago  ? UPPER GASTROINTESTINAL ENDOSCOPY  12/2019  ? ? ?FAMILY HISTORY: ?Family History  ?Problem Relation Age of Onset  ? Cancer Mother   ?     Breast   ? Breast cancer Mother   ? Stroke Brother   ?     open heart surgery   ? Colon cancer Neg Hx   ? Esophageal cancer Neg Hx   ? Rectal cancer Neg Hx   ? Stomach cancer Neg Hx   ? ? ?SOCIAL HISTORY: ? ?Social History  ? ?Socioeconomic History  ? Marital status: Married  ?  Spouse name: Iona Beard  ? Number of children: 1  ? Years of education: GED  ? Highest education level: Not on file  ?Occupational History  ? Occupation: Retired  ?Tobacco Use  ? Smoking status: Former  ?  Packs/day: 2.00  ?  Years: 40.00  ?  Pack years: 80.00  ?  Types: Cigarettes  ?  Quit date: 02/05/1996  ?  Years since quitting: 26.0  ? Smokeless tobacco: Never  ? Tobacco comments:  ?  Educated LDCT for lung Ca to discuss with MD if interested  ?Vaping Use  ? Vaping Use: Never used  ?Substance and Sexual Activity  ? Alcohol use: Yes  ?  Alcohol/week: 3.0 standard drinks  ?  Types: 3 Glasses of wine per week  ?  Comment: wine with dinner occ  ? Drug use: No  ? Sexual activity: Never  ?  Partners: Male  ?Other Topics Concern  ? Not on file  ?Social History Narrative  ? Lives w/ husband   ? Caffeine use: Coffee daily  ? Right handed   ? ?Social Determinants of Health  ? ?Financial Resource Strain: Not on file  ?Food  Insecurity: Not on file  ?Transportation Needs: Not on file  ?Physical Activity: Not on file  ?Stress: Not on file  ?Social Connections: Not on file  ?Intimate Partner Violence: Not on file  ? ? ? ?PHYSICAL

## 2022-02-19 NOTE — Telephone Encounter (Signed)
[employer] medicare order sent to [facility], they will obtain the auth and reach out to the patient to schedule.  

## 2022-02-21 ENCOUNTER — Ambulatory Visit
Admission: RE | Admit: 2022-02-21 | Discharge: 2022-02-21 | Disposition: A | Discharge status: Home / Self Care | Payer: Medicare HMO | Source: Ambulatory Visit | Attending: Neurology | Admitting: Neurology

## 2022-02-21 ENCOUNTER — Other Ambulatory Visit: Payer: Self-pay

## 2022-02-21 DIAGNOSIS — G8929 Other chronic pain: Secondary | ICD-10-CM

## 2022-02-21 DIAGNOSIS — M545 Low back pain, unspecified: Secondary | ICD-10-CM | POA: Diagnosis not present

## 2022-02-21 DIAGNOSIS — M5416 Radiculopathy, lumbar region: Secondary | ICD-10-CM | POA: Diagnosis not present

## 2022-02-24 ENCOUNTER — Telehealth: Payer: Self-pay | Admitting: *Deleted

## 2022-02-24 NOTE — Telephone Encounter (Signed)
Took call from phone staff and spoke w/ Malachy Mood from [facility]. Confirmed MRI lumbar results in Epic. Will forward to Dr. Felecia Shelling to review. ?

## 2022-02-25 ENCOUNTER — Telehealth: Payer: Self-pay

## 2022-02-25 NOTE — Telephone Encounter (Addendum)
Spoke with pt. Pt was notified of lab results and recommendations to see pharm D. Pt will follow up with our office as directed.  ----- Message from Lenna Sciara, NP sent at 02/20/2022  7:48 AM EDT ----- ?Recent labs show cholesterol is elevated above goal, LDL cholesterol is 161 (goal is 70 or below).  Follow-up with lipid clinic pharmacist as discussed at recent visit.  Continue current medications, and follow-up as planned.  Thank you. ?

## 2022-02-28 ENCOUNTER — Ambulatory Visit (INDEPENDENT_AMBULATORY_CARE_PROVIDER_SITE_OTHER): Payer: Medicare HMO | Admitting: Internal Medicine

## 2022-02-28 ENCOUNTER — Encounter: Payer: Self-pay | Admitting: Internal Medicine

## 2022-02-28 VITALS — BP 102/64 | HR 79 | Resp 18 | Ht 64.0 in | Wt 126.2 lb

## 2022-02-28 DIAGNOSIS — R0789 Other chest pain: Secondary | ICD-10-CM | POA: Diagnosis not present

## 2022-02-28 DIAGNOSIS — M542 Cervicalgia: Secondary | ICD-10-CM | POA: Diagnosis not present

## 2022-02-28 DIAGNOSIS — R5383 Other fatigue: Secondary | ICD-10-CM | POA: Diagnosis not present

## 2022-02-28 DIAGNOSIS — J449 Chronic obstructive pulmonary disease, unspecified: Secondary | ICD-10-CM | POA: Diagnosis not present

## 2022-02-28 DIAGNOSIS — J9859 Other diseases of mediastinum, not elsewhere classified: Secondary | ICD-10-CM | POA: Diagnosis not present

## 2022-02-28 DIAGNOSIS — R079 Chest pain, unspecified: Secondary | ICD-10-CM | POA: Insufficient documentation

## 2022-02-28 DIAGNOSIS — Z8632 Personal history of gestational diabetes: Secondary | ICD-10-CM | POA: Diagnosis not present

## 2022-02-28 LAB — COMPREHENSIVE METABOLIC PANEL
ALT: 20 U/L (ref 0–35)
AST: 24 U/L (ref 0–37)
Albumin: 4.4 g/dL (ref 3.5–5.2)
Alkaline Phosphatase: 73 U/L (ref 39–117)
BUN: 15 mg/dL (ref 6–23)
CO2: 29 mEq/L (ref 19–32)
Calcium: 9.5 mg/dL (ref 8.4–10.5)
Chloride: 104 mEq/L (ref 96–112)
Creatinine, Ser: 0.99 mg/dL (ref 0.40–1.20)
GFR: 55 mL/min — ABNORMAL LOW (ref >60.00)
Glucose, Bld: 96 mg/dL (ref 70–99)
Potassium: 4.2 mEq/L (ref 3.5–5.1)
Sodium: 141 mEq/L (ref 135–145)
Total Bilirubin: 0.4 mg/dL (ref 0.2–1.2)
Total Protein: 6.7 g/dL (ref 6.0–8.3)

## 2022-02-28 LAB — CBC
HCT: 43.8 % (ref 36.0–46.0)
Hemoglobin: 14.5 g/dL (ref 12.0–15.0)
MCHC: 33.1 g/dL (ref 30.0–36.0)
MCV: 85.4 fl (ref 78.0–100.0)
Platelets: 295 10*3/uL (ref 150.0–400.0)
RBC: 5.13 Mil/uL — ABNORMAL HIGH (ref 3.87–5.11)
RDW: 14.4 % (ref 11.5–15.5)
WBC: 5.9 10*3/uL (ref 4.0–10.5)

## 2022-02-28 LAB — VITAMIN D 25 HYDROXY (VIT D DEFICIENCY, FRACTURES): VITD: 73.91 ng/mL (ref 30.00–100.00)

## 2022-02-28 LAB — HEMOGLOBIN A1C: Hgb A1c MFr Bld: 6.2 % (ref 4.6–6.5)

## 2022-02-28 LAB — VITAMIN B12: Vitamin B-12: 905 pg/mL (ref 211–911)

## 2022-02-28 MED ORDER — MIRABEGRON ER 50 MG PO TB24: 50.0000 mg | ORAL_TABLET | Freq: Every day | ORAL | 1 refills | Status: DC

## 2022-02-28 NOTE — Assessment & Plan Note (Signed)
Given fatigue checking HgA1c as none in some time. Treat as appropriate.  ?

## 2022-02-28 NOTE — Patient Instructions (Signed)
We are checking the labs today and will ask about if the scan is needed.  ? ?We have sent in myrbetriq for the bladder to take 1 pill daily for 1 month. ? ? ?

## 2022-02-28 NOTE — Assessment & Plan Note (Signed)
Moderate SOB and unclear to know how much this is limiting energy and fatigue. She is taking anoro and thinks this helps some.  ?

## 2022-02-28 NOTE — Assessment & Plan Note (Signed)
Unclear source as recent cardiac evaluation showed not likely coming from heart. She has COPD and mediastinal mass. It is possible there is some compression of a nerve from LN and mediastinal mass. She is taking tylenol and ibuprofen for pain. She has not had BMP in some time will check for renal function. If abnormal may need to stop NSAIDs. ?

## 2022-02-28 NOTE — Progress Notes (Signed)
? ?  Subjective:  ? ?Patient ID: Wendy Patrick, female    DOB: [birth year], 76 y.o.   MRN: [id number] ? ?HPI ?The patient is a 76 YO female coming in for concerns.  ? ?Review of Systems  ?Constitutional:  Positive for activity change and fatigue.  ?HENT: Negative.    ?Eyes: Negative.   ?Respiratory:  Positive for cough and shortness of breath. Negative for chest tightness.   ?Cardiovascular:  Positive for chest pain. Negative for palpitations and leg swelling.  ?Gastrointestinal:  Negative for abdominal distention, abdominal pain, constipation, diarrhea, nausea and vomiting.  ?Musculoskeletal:  Positive for arthralgias, back pain, myalgias and neck pain.  ?Skin: Negative.   ?Neurological: Negative.   ?Psychiatric/Behavioral: Negative.    ? ?Objective:  ?Physical Exam ?Constitutional:   ?   Appearance: She is well-developed.  ?HENT:  ?   Head: Normocephalic and atraumatic.  ?Cardiovascular:  ?   Rate and Rhythm: Normal rate and regular rhythm.  ?Pulmonary:  ?   Effort: Pulmonary effort is normal. No respiratory distress.  ?   Breath sounds: Normal breath sounds. No wheezing or rales.  ?Abdominal:  ?   General: Bowel sounds are normal. There is no distension.  ?   Palpations: Abdomen is soft.  ?   Tenderness: There is no abdominal tenderness. There is no rebound.  ?Musculoskeletal:     ?   General: Tenderness present.  ?   Cervical back: Normal range of motion.  ?Skin: ?   General: Skin is warm and dry.  ?Neurological:  ?   Mental Status: She is alert and oriented to person, place, and time.  ?   Coordination: Coordination normal.  ? ? ?Vitals:  ? 02/28/22 0810  ?BP: 102/64  ?Pulse: 79  ?Resp: 18  ?SpO2: 98%  ?Weight: 126 lb 3.2 oz (57.2 kg)  ?Height: '5\' 4"'$  (1.626 m)  ? ? ?This visit occurred during the SARS-CoV-2 public health emergency.  Safety protocols were in place, including screening questions prior to the visit, additional usage of staff PPE, and extensive cleaning of exam room while observing appropriate contact  time as indicated for disinfecting solutions.  ? ?Assessment & Plan:  ? ?

## 2022-02-28 NOTE — Assessment & Plan Note (Signed)
Checking B12 and vitamin D and CBC and CMP to rule out metabolic source of fatigue. Pulmonary has recommended sleep study which she does not want to pursue.  ?

## 2022-02-28 NOTE — Assessment & Plan Note (Signed)
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 This is likely benign. They are concerned and want me to check with her pulmonary doctor if this needs to be rechecked this year. Will message to ask this.  ?

## 2022-02-28 NOTE — Assessment & Plan Note (Signed)
Seeing neurology and they have recommended PT. I have advised her to do the PT to help with her pain potentially.  ?

## 2022-03-05 ENCOUNTER — Ambulatory Visit: Payer: Medicare HMO | Admitting: Pharmacist

## 2022-03-05 VITALS — BP 125/79 | HR 71

## 2022-03-05 DIAGNOSIS — E7800 Pure hypercholesterolemia, unspecified: Secondary | ICD-10-CM | POA: Diagnosis not present

## 2022-03-05 DIAGNOSIS — E785 Hyperlipidemia, unspecified: Secondary | ICD-10-CM

## 2022-03-05 NOTE — Patient Instructions (Signed)
?  It was nice meeting you today! ? ?We would like your LDL (bad cholesterol) to be less than 70 ? ?We will start a new medication called Praluent which you will inject once every 2 weeks ? ?Once you start the medication we will recheck your cholesterol in 2-3 months ? ?I will complete the prior authorization for you and contact you when it is complete ? ?Please call with any questions! ? ?Karren Cobble, PharmD, BCACP, [place], CPP ?[address], [address] ?[place], [place], [address] ?Phone: [phone], Fax: [phone]  ?

## 2022-03-05 NOTE — Progress Notes (Signed)
Patient ID: Wendy Patrick                 DOB: [birth year]                    MRN: [id number] ? ? ? ? ?HPI: ?Wendy Patrick is a 76 y.o. female patient referred to lipid clinic by Wendy Patrick. PMH is significant for CAD, statin intolerance, COPD, history of smoking, elevated coronary calcium score, and HLD. ? ?Patient presents today with husband in good spirits.  Has tried multiple statins such as rosuvastatin and atorvastatin and all caused muscle pain.  In Mar 12, 2019 was prescribed Repatha but discontinued due to cold/flu like symptoms such as runny nose and cough. She reports she does not remember why she discontinued and would be willing to try again but is concerned regarding cost.   ? ?Current Medications: Krill Oil ? ?Intolerances:  ?Rosuvastatin ?Atorvastatin ? ?Risk Factors:  ?CAD ?HTN ?HLD ?Elevated coronary calcium score ?Former smoker ? ?LDL goal: <70 ? ?Labs: TC 262, Trigs 163, HDL 72, LDL 161 (02/19/22) ? ?At least moderate mixed CAD of the LCx, CADRADS = 3. There is mild to moderate mixed CAD of the LAD and RCA as well. ? ?Coronary calcium score of 252. This was 76th percentile for age and sex matched control. ? ?Past Medical History:  ?Diagnosis Date  ? AIN grade I   ? COPD (chronic obstructive pulmonary disease) ([place])   ? Coronary artery disease 08/2009  ? 40% mid left circ by cath in [place], [place]  ? Dyspnea   ? with heavy exertion  ? Endometriosis   ? 1986  ? Endometriosis yrs ago  ? Family history of adverse reaction to anesthesia   ? sister ponv  ? GERD (gastroesophageal reflux disease)   ? Granuloma annulare   ? Gum lesion   ? per pt left jawborn has small knot on gum on back tooth, is not painful or draining  ? Headache   ? History of gestational diabetes many  yrs ago  ? History of hiatal hernia   ? slight hh  ? History of kidney stones 03/12/2011  ? passed on own  ? History of migraine yrs ago  ? Hyperlipidemia   ? Optic neuritis   ? 03-11-2014  ? Optic neuritis Novembr  ? Trigeminal neuralgia   ? Urinary urgency    ? Vaginal anomaly   ? tears and bleeds easily  ? ? ?Current Outpatient Medications on File Prior to Visit  ?Medication Sig Dispense Refill  ? Ascorbic Acid (VITAMIN C) 1000 MG tablet Take 1,000 mg by mouth daily.    ? Azelastine HCl 137 MCG/SPRAY SOLN SPRAY TWO SPRAYS IN EACH NOSTRIL TWICE DAILY. USE IN EACH NOSTRIL AS DIRECTED 30 mL 12  ? cholecalciferol (VITAMIN D) 1000 units tablet Take 10,000 Units by mouth daily. Vitamin d 3    ? fluticasone (FLONASE) 50 MCG/ACT nasal spray SPRAY TWO SPRAYS IN EACH NOSTRIL ONCE DAILY 16 mL 4  ? KRILL OIL PO Take 250 mg by mouth daily.    ? Loratadine 10 MG CAPS Take 10 mg by mouth daily.    ? mirabegron ER (MYRBETRIQ) 50 MG TB24 tablet Take 1 tablet (50 mg total) by mouth daily. 90 tablet 1  ? Multiple Vitamins-Minerals (CENTRUM SILVER PO) Take by mouth daily at 2 PM.    ? umeclidinium-vilanterol (ANORO ELLIPTA) 62.5-25 MCG/ACT AEPB INHALE 1 PUFF INTO THE LUNGS DAILY 60 each 5  ? ?  No current facility-administered medications on file prior to visit.  ? ? ?Allergies  ?Allergen Reactions  ? Bevespi Aerosphere [Glycopyrrolate-Formoterol] Other (See Comments)  ?  Patient felt like her throat was closing up  ? Duloxetine Diarrhea  ? Statins   ?  Leg cramps  ? Crestor [Rosuvastatin] Other (See Comments)  ?  Leg cramps  ? Lamotrigine Rash  ? Latex Rash  ? ? ?Assessment/Plan: ? ?1. Hyperlipidemia - Patient's LDL 161 which is above goal of <70. Unfortunately is intolerant to all statins. Patient needs aggressive lipid lowering due to elevated coronary calcium score and CAD. Discussed that she previously discontinued Repatha due to "cold like" symptoms however she does not remember this and is willing to try again.  Will complete PA and sign patient up for [facility] access.  Recheck lipid panel in 2-3 months. ? ?Start Praluent '75mg'$  sq q 14 days ?Recheck lipid panel in 2-3 months ? ?Karren Cobble, PharmD, BCACP, [place], CPP ?[address], [address] ?[place], [place], [address] ?Phone:  [phone], Fax: [phone]  ?  ?

## 2022-03-06 ENCOUNTER — Encounter: Payer: Self-pay | Admitting: Pharmacist

## 2022-03-06 ENCOUNTER — Telehealth: Payer: Self-pay | Admitting: Pharmacist

## 2022-03-06 DIAGNOSIS — E785 Hyperlipidemia, unspecified: Secondary | ICD-10-CM

## 2022-03-06 MED ORDER — PRALUENT 75 MG/ML ~~LOC~~ SOAJ
75.0000 mg | SUBCUTANEOUS | 11 refills | Status: DC
Start: 2022-03-06 — End: 2023-01-02

## 2022-03-06 NOTE — Telephone Encounter (Signed)
Called pt and spoke w/husband to say that they were approved, rx sent, healthwell information sent in as note to pharmacy on rx, instructed the pt to complete fasting lab post 4th dose. They voiced understanding  ? ?Praluent '75mg'$  q2w approved through 11/30/22 ? ?Lipid panel ordered and released ? ?Healthwell approved: ?Pharmacy Card ?CARD NO. ?[id number] ?  ?CARD STATUS ?Active ?  ?BIN ?[id number] ?  ?PCN ?[id number] ?  ?PC GROUP ?[id number] ?  ?HELP DESK ?[phone] ?  ?PROVIDER ?PDMI ?  ?PROCESSOR ?PDMI ?

## 2022-03-06 NOTE — Telephone Encounter (Signed)
Please complete prior authorization for: ? ?Name of medication, dose, and frequency Praluent '75mg'$  sq q 14 days ? ?Lab Orders Requested? yes ? ?Which labs? Lipid panel ? ?Estimated date for labs to be scheduled 2-3 months ? ?Does patient need activated copay card? No but needs [place] with husband  ?

## 2022-03-31 ENCOUNTER — Encounter: Payer: Self-pay | Admitting: Nurse Practitioner

## 2022-03-31 ENCOUNTER — Ambulatory Visit: Payer: Medicare HMO | Admitting: Nurse Practitioner

## 2022-03-31 VITALS — BP 122/66 | HR 99 | Temp 98.0°F | Ht 64.0 in | Wt 127.6 lb

## 2022-03-31 DIAGNOSIS — I25119 Atherosclerotic heart disease of native coronary artery with unspecified angina pectoris: Secondary | ICD-10-CM | POA: Diagnosis not present

## 2022-03-31 DIAGNOSIS — J9859 Other diseases of mediastinum, not elsewhere classified: Secondary | ICD-10-CM

## 2022-03-31 DIAGNOSIS — J449 Chronic obstructive pulmonary disease, unspecified: Secondary | ICD-10-CM

## 2022-03-31 DIAGNOSIS — Z87891 Personal history of nicotine dependence: Secondary | ICD-10-CM | POA: Diagnosis not present

## 2022-03-31 DIAGNOSIS — J3089 Other allergic rhinitis: Secondary | ICD-10-CM | POA: Diagnosis not present

## 2022-03-31 MED ORDER — BREZTRI AEROSPHERE 160-9-4.8 MCG/ACT IN AERO: 2.0000 | INHALATION_SPRAY | Freq: Two times a day (BID) | RESPIRATORY_TRACT | 5 refills | Status: DC

## 2022-03-31 NOTE — Assessment & Plan Note (Signed)
Has began experiencing left-sided chest tightness/pain that radiates down the left arm since we saw her last.  She did report this to cardiology who did an EKG and did not think it was related to her heart, per the patient.  No further work-up was performed.  I am concerned that this could possibly be unstable angina.  Advised that if it reoccurs she should seek emergency care.  The arm pain could also be associated to her cervical radiculopathy; however there would be no reason for left-sided chest tightness/pain with this. ?

## 2022-03-31 NOTE — Assessment & Plan Note (Signed)
Previously considered benign.  Thought to be either a reactive node, complex bronchogenic cyst or GI duplication cyst.  Follow-up CT chest was also stable. ?

## 2022-03-31 NOTE — Progress Notes (Signed)
? ?'@Patient'$  ID: Wendy Patrick, female    DOB: 13-May-1946, 76 y.o.   MRN: [id number] ? ?Chief Complaint  ?Patient presents with  ? Acute Visit  ?  Pt states that she is having a cough that is causing her to have pain in her chest. She states the cough got bad for about 5 years or more. Pian noted to be going in left arm for about 6 months or longer. Pt states she coughs up clear mucus. Sometimes its yellow in color but its thick.   ? ? ?Referring provider: ?Hoyt Koch, * ? ?HPI: ?76 year old female, former smoker (80 pack years) followed for COPD with emphysema and mediastinal mass.  She is a patient of Dr. August Albino and last seen in office 03/27/2021.  Past medical history significant for CAD, allergic rhinitis, GERD, cervical radiculopathy, HLD, depression with anxiety, fatigue. ? ?TEST/EVENTS:  ?10/10/2014 echocardiogram: EF 60-65%.  Overall normal exam. ?04/17/2020 heart catheterization: Nonobstructive coronary artery disease with mild nonobstructive stenosis of the proximal to mid LAD and moderate nonobstructive stenosis of the mid left circumflex, confirmed to be nonobstructive by invasive RFR evaluation.  Normal LV EDP ?06/19/2020 CT chest with contrast: Subcarinal 2.8 x 2.1 cm soft tissue density mass is stable since 04/04/2020 coronary CT.  Differentials include pathological subcarinal lymph node versus dense bronchopulmonary or esophageal duplication cyst.  There is mild AP window lymphadenopathy present.  There is marked centrilobular emphysema with diffuse bronchial wall thickening, suggesting COPD.  No lung masses or significant pulmonary nodules identified.  There is atherosclerosis.  Small hiatal hernia is present.  Bilateral adrenal adenomas are also noted. ?06/28/2020 PET scan: No hypermetabolism to correspond to the subcarinal mass, which may have decreased slightly in size.  Differential considerations include reactive node or complex bronchogenic cyst or GI duplication cyst.  Recommended CT  follow-up in 3 to 6 months.  There is a left adrenal adenoma.  There is focal hypermetabolism of the transverse colon for which polyp or mass cannot be excluded; correlate with colon cancer screening history and consider colonoscopy if not up-to-date.  There is an incidental finding of a atherosclerosis and CAD as well as emphysema. ?08/17/2020 PFTs: FVC 84, FEV1 72, ratio 64, TLC 96%, DLCO 46%.  Moderate obstructive airway disease with severe diffusion defect. ?12/04/2020 CT chest with contrast: There is atherosclerosis.  There is an unchanged subcarinal soft tissue attenuation mass measuring 3.0 x 2.3 cm.  Numerous prominent mediastinal and bilateral hilar lymph nodes are unchanged.  There is a developmental variant of a prominent azygous fissure of the right upper lobe.  Moderate to severe centrilobular emphysema is present.  There is hepatic steatosis. ? ?03/27/2021: OV with Dr. Erin Fulling.  Compensated on current regimen with Anoro.  Allergic rhinitis stable with Flonase, Claritin, Astelin and Atrovent spray.  Does report that she is having some throat irritation and trouble swallowing.  Has plans for barium swallow, which was scheduled by GI yesterday.  Currently on Protonix for possible GERD symptoms.  Discussed a possible home sleep study to evaluate for OSA given her progressive fatigue, patient declined.  Subcarinal mass has been stable on previous imaging and had no hypermetabolism on previous PET. ? ?03/31/2022: Today-follow-up ?Patient presents today for overdue follow-up.  She has been having a ongoing cough for the last 5 years.  Reports that it has been progressively worse over the last year.  It is primarily dry throughout the day ; she does have some increased sputum production that is clear in  the mornings.  Her DOE has also worsened over the last year.  Feels like her activity tolerance is not what it used to be.  She has had some pain that goes up and down her left arm and feels like a shooting sensation;  ongoing for around a year.  She has also had some associated chest tightness and left side.  She was seen by cardiology and told that they thought this was related to her lungs or subcarinal mass.  EKG was normal but otherwise had no further work-up.  She also has a history of cervical radiculopathy but has not been evaluated by neurosurgery for this. She is not having any pain today. She denies any hemoptysis, anorexia, lower extremity edema, orthopnea, PND.  Weight has been stable since we saw her last.  She continues on Anoro daily.  Feels like her nasal congestion and drainage is stable on current regimen.  She is very concerned about the mass in her chest, which was previously considered to be benign. ? ?Allergies  ?Allergen Reactions  ? Bevespi Aerosphere [Glycopyrrolate-Formoterol] Other (See Comments)  ?  Patient felt like her throat was closing up  ? Atorvastatin Other (See Comments)  ?  myalgia  ? Duloxetine Diarrhea  ? Statins   ?  Leg cramps  ? Crestor [Rosuvastatin] Other (See Comments)  ?  Leg cramps  ? Lamotrigine Rash  ? Latex Rash  ? ? ?Immunization History  ?Administered Date(s) Administered  ? Pneumococcal Conjugate-13 06/05/2016  ? Pneumococcal Polysaccharide-23 12/01/2009, 08/02/2019, 08/22/2019  ? Tdap 12/01/2010  ? Zoster, Live 12/02/2011  ? ? ?Past Medical History:  ?Diagnosis Date  ? AIN grade I   ? COPD (chronic obstructive pulmonary disease) ([place])   ? Coronary artery disease 08/2009  ? 40% mid left circ by cath in [place], [place]  ? Dyspnea   ? with heavy exertion  ? Endometriosis   ? 1986  ? Endometriosis yrs ago  ? Family history of adverse reaction to anesthesia   ? sister ponv  ? GERD (gastroesophageal reflux disease)   ? Granuloma annulare   ? Gum lesion   ? per pt left jawborn has small knot on gum on back tooth, is not painful or draining  ? Headache   ? History of gestational diabetes many  yrs ago  ? History of hiatal hernia   ? slight hh  ? History of kidney stones 2012  ? passed  on own  ? History of migraine yrs ago  ? Hyperlipidemia   ? 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 Optic neuritis Novembr  ? Trigeminal neuralgia   ? Urinary urgency   ? Vaginal anomaly   ? tears and bleeds easily  ? ? ?Tobacco History: ?Social History  ? ?Tobacco Use  ?Smoking Status Former  ? Packs/day: 2.00  ? Years: 40.00  ? Pack years: 80.00  ? Types: Cigarettes  ? Quit date: 02/05/1996  ? Years since quitting: 26.1  ?Smokeless Tobacco Never  ?Tobacco Comments  ? Educated LDCT for lung Ca to discuss with MD if interested  ? ?Counseling given: Not Answered ?Tobacco comments: Educated LDCT for lung Ca to discuss with MD if interested ? ? ?Outpatient Medications Prior to Visit  ?Medication Sig Dispense Refill  ? Alirocumab (PRALUENT) 75 MG/ML SOAJ Inject 75 mg into the skin every 14 (fourteen) days. 2 mL 11  ? Ascorbic Acid (VITAMIN C) 1000 MG tablet Take 1,000 mg by mouth daily.    ?  Azelastine HCl 137 MCG/SPRAY SOLN SPRAY TWO SPRAYS IN EACH NOSTRIL TWICE DAILY. USE IN EACH NOSTRIL AS DIRECTED 30 mL 12  ? cholecalciferol (VITAMIN D) 1000 units tablet Take 10,000 Units by mouth daily. Vitamin d 3    ? KRILL OIL PO Take 250 mg by mouth daily.    ? Loratadine 10 MG CAPS Take 10 mg by mouth daily.    ? Multiple Vitamins-Minerals (CENTRUM SILVER PO) Take by mouth daily at 2 PM.    ? umeclidinium-vilanterol (ANORO ELLIPTA) 62.5-25 MCG/ACT AEPB INHALE 1 PUFF INTO THE LUNGS DAILY 60 each 5  ? fluticasone (FLONASE) 50 MCG/ACT nasal spray SPRAY TWO SPRAYS IN EACH NOSTRIL ONCE DAILY 16 mL 4  ? mirabegron ER (MYRBETRIQ) 50 MG TB24 tablet Take 1 tablet (50 mg total) by mouth daily. 90 tablet 1  ? ?No facility-administered medications prior to visit.  ? ? ? ?Review of Systems:  ? ?Constitutional: No weight loss or gain, night sweats, fevers, chills. +fatigue (chronic, unchanged) ?HEENT: +difficulty swallowing  (cleared by GI with normal barium swallow). no headaches, tooth/dental problems, or sore throat. No sneezing, itching, ear ache,  nasal congestion, or post nasal drip ?CV:  +left sided chest pain/tightness with associated arm pain (intermittent; happens at rest; not actively having pain). No orthopnea, PND, swelling in lower extremitie

## 2022-03-31 NOTE — Assessment & Plan Note (Signed)
Given significant smoking history and progressive worsening of respiratory status, will obtain CT chest for further evaluation.  She is not a candidate for lung cancer screening program given she quit smoking greater than 15 years ago. ?

## 2022-03-31 NOTE — Assessment & Plan Note (Signed)
Well-controlled on current regimen.  Continue Astelin nasal spray.  Add Flonase back if worsening nasal drainage or postnasal drip recurs. ?

## 2022-03-31 NOTE — Assessment & Plan Note (Addendum)
Progressive worsening of DOE and cough over the last year.  Will trial step up to triple therapy with Breztri.  Repeat PFTs for further evaluation.  Advised mucociliary clearance therapies. ? ?Patient Instructions  ?-Stop Anoro. Trial change to Breztri 2 puffs Twice daily. Brush tongue and rinse mouth afterwards ?-Continue Claritin 10 mg daily for allergies  ?-Continue flonase 2 sprays each nostril daily  ? ?Mucinex 600 mg Twice daily for chest congestion/cough  ?Flutter valve 2-3 times a day  ? ?CT chest scan ordered today. Someone will contact you for scheduling  ? ?Pulmonary function testing - scheduled today  ? ?Follow up after PFTs with Dr. Erin Fulling or Alanson Aly. If symptoms do not improve or worsen, please contact office for sooner follow up or seek emergency care.  ? ? ?

## 2022-03-31 NOTE — Patient Instructions (Addendum)
-  Stop Anoro. Trial change to Breztri 2 puffs Twice daily. Brush tongue and rinse mouth afterwards ?-Continue Claritin 10 mg daily for allergies  ?-Continue flonase 2 sprays each nostril daily  ? ?Mucinex 600 mg Twice daily for chest congestion/cough  ?Flutter valve 2-3 times a day  ? ?CT chest scan ordered today. Someone will contact you for scheduling  ? ?Pulmonary function testing - scheduled today  ? ?Follow up after PFTs with Dr. Erin Fulling or Alanson Aly. If symptoms do not improve or worsen, please contact office for sooner follow up or seek emergency care.  ?

## 2022-04-09 NOTE — Telephone Encounter (Signed)
I called and spoke with the pt ?She says that the chest tightness is the same- nothing new compared to last visit  ?She says her breathing has been slightly worse past couple of days  ?No coughing or wheezing  ?I offered video visit  ?She wants to wait until after CT is done and declined appt at this time  ?I advised we are happy to see her sooner and to seek emergent care if symptoms persist or worsen ?

## 2022-04-14 ENCOUNTER — Ambulatory Visit (INDEPENDENT_AMBULATORY_CARE_PROVIDER_SITE_OTHER)
Admission: RE | Admit: 2022-04-14 | Discharge: 2022-04-14 | Disposition: A | Discharge status: Home / Self Care | Payer: Medicare HMO | Source: Ambulatory Visit | Attending: Nurse Practitioner | Admitting: Nurse Practitioner

## 2022-04-14 DIAGNOSIS — J9859 Other diseases of mediastinum, not elsewhere classified: Secondary | ICD-10-CM | POA: Diagnosis not present

## 2022-04-14 DIAGNOSIS — J439 Emphysema, unspecified: Secondary | ICD-10-CM | POA: Diagnosis not present

## 2022-04-24 NOTE — Progress Notes (Signed)
Office Visit    Patient Name: Wendy Patrick Date of Encounter: 04/25/2022  Primary Care Provider:  Hoyt Koch, MD Primary Cardiologist:  Pixie Casino, MD  Chief Complaint    76 year old female with a history of CAD, hyperlipidemia with statin intolerance, COPD, chronic back pain, depression, and anxiety who presents for follow-up related to CAD.   Past Medical History    Past Medical History:  Diagnosis Date   AIN grade I    COPD (chronic obstructive pulmonary disease) ([place])    Coronary artery disease 08/2009   40% mid left circ by cath in [place], [place]   Dyspnea    with heavy exertion   Endometriosis    1986   Endometriosis yrs ago   Family history of adverse reaction to anesthesia    sister ponv   GERD (gastroesophageal reflux disease)    Granuloma annulare    Gum lesion    per pt left jawborn has small knot on gum on back tooth, is not painful or draining   Headache    History of gestational diabetes many  yrs ago   History of hiatal hernia    slight hh   History of kidney stones 02-17-2011   passed on own   History of migraine yrs ago   Hyperlipidemia    Optic neuritis    Feb 16, 2014   Optic neuritis Novembr   Trigeminal neuralgia    Urinary urgency    Vaginal anomaly    tears and bleeds easily   Past Surgical History:  Procedure Laterality Date   ABDOMINAL HYSTERECTOMY  age 75   CARDIAC CATHETERIZATION  02-16-09   COLONOSCOPY  12/2019   3 polyps removed 1 pre cancer   EXCISION OF SKIN TAG N/A 02/14/2021   Procedure: EXCISION OF ANAL LESIONS;  Surgeon: Leighton Ruff, MD;  Location: [facility];  Service: General;  Laterality: N/A;  29 MIN TOTAL   EYE SURGERY Bilateral 02/17/2016   both eyes cataracts lens replacement   LEFT HEART CATH AND CORONARY ANGIOGRAPHY N/A 04/17/2020   Procedure: LEFT HEART CATH AND CORONARY ANGIOGRAPHY;  Surgeon: Sherren Mocha, MD;  Location: [facility];  Service: Cardiovascular;  Laterality: N/A;   MANDIBLE  SURGERY     gum tumor removed in [place] not sure if was benign per pt   MINOR HEMORRHOIDECTOMY  yrs ago   RECTAL EXAM UNDER ANESTHESIA N/A 02/14/2021   Procedure: ANAL EXAM UNDER ANESTHESIA;  Surgeon: Leighton Ruff, MD;  Location: [facility];  Service: General;  Laterality: N/A;   TUBAL LIGATION  yrs ago   UPPER GASTROINTESTINAL ENDOSCOPY  12/2019    Allergies  Allergies  Allergen Reactions   Bevespi Aerosphere [Glycopyrrolate-Formoterol] Other (See Comments)    Patient felt like her throat was closing up   Atorvastatin Other (See Comments)    myalgia   Duloxetine Diarrhea   Statins     Leg cramps   Crestor [Rosuvastatin] Other (See Comments)    Leg cramps   Lamotrigine Rash   Latex Rash    History of Present Illness    76 year old female with the above past medical history including CAD, hyperlipidemia with statin intolerance, COPD, chronic back pain, depression, and anxiety.    Previously followed by Dr. Radford Pax, later switched to Dr. Debara Pickett. Cardiac catheterization in 2009-02-16 showed 40% stenosis in the left circumflex artery, otherwise, no significant CAD.  She is intolerant to statins due to leg cramps.  Previously managed  on Repatha, however this was later discontinued due to side effects. Myoview in 2018 was negative.  She underwent coronary CTA in May 2021 in the setting of recurrent chest pain, which revealed mild to moderate mixed CAD of LAD and RCA, calcium score was 252, FFR was significant for mid to distal left circumflex stenosis.  Cardiac catheterization in 2021 showed nonobstructive 40% stenosis in the proximal to mid LAD, moderate nonobstructive 50% disease in the left circumflex artery.  Medical therapy was recommended. She was last seen in the office on 02/12/2021 and was stable overall from a cardiac standpoint, though she did report ongoing intermittent chest discomfort which she describes as a cramping in her mid to left center of her chest that  sometimes radiating to her left arm.  Repeat Lexiscan was negative for ischemia.   She presents today for follow-up accompanied by her husband.  Since her last visit she reports ongoing cough occasional chest discomfort at rest. She denies exertional symptoms concerning for angina. She is following with pulmonology and had just underwent repeat CT chest, she is also due for repeat PFTs. Other than her persistent cough, she denies any additional concerns today.   Home Medications    Current Outpatient Medications  Medication Sig Dispense Refill   Alirocumab (PRALUENT) 75 MG/ML SOAJ Inject 75 mg into the skin every 14 (fourteen) days. 2 mL 11   Ascorbic Acid (VITAMIN C) 1000 MG tablet Take 1,000 mg by mouth daily.     Azelastine HCl 137 MCG/SPRAY SOLN SPRAY TWO SPRAYS IN EACH NOSTRIL TWICE DAILY. USE IN EACH NOSTRIL AS DIRECTED 30 mL 12   Budeson-Glycopyrrol-Formoterol (BREZTRI AEROSPHERE) 160-9-4.8 MCG/ACT AERO Inhale 2 puffs into the lungs in the morning and at bedtime. 10.7 g 5   cholecalciferol (VITAMIN D) 1000 units tablet Take 10,000 Units by mouth daily. Vitamin d 3     KRILL OIL PO Take 250 mg by mouth daily.     Loratadine 10 MG CAPS Take 10 mg by mouth daily.     Multiple Vitamins-Minerals (CENTRUM SILVER PO) Take by mouth daily at 2 PM.     No current facility-administered medications for this visit.     Review of Systems    She denies palpitations, dyspnea, pnd, orthopnea, n, v, dizziness, syncope, edema, weight gain, or early satiety. All other systems reviewed and are otherwise negative except as noted above.   Physical Exam    VS:  BP 130/72   Pulse 84   Ht '5\' 4"'$  (1.626 m)   Wt 127 lb 12.8 oz (58 kg)   SpO2 97%   BMI 21.94 kg/m  GEN: Well nourished, well developed, in no acute distress. HEENT: normal. Neck: Supple, no JVD, carotid bruits, or masses. Cardiac: RRR, no murmurs, rubs, or gallops. No clubbing, cyanosis, edema.  Radials/DP/PT 2+ and equal bilaterally.   Respiratory:  Respirations regular and unlabored, clear to auscultation bilaterally. GI: Soft, nontender, nondistended, BS + x 4. MS: no deformity or atrophy. Skin: warm and dry, no rash. Neuro:  Strength and sensation are intact. Psych: Normal affect.  Accessory Clinical Findings    ECG personally reviewed by me today - No EKG in office today.  Lab Results  Component Value Date   WBC 5.9 02/28/2022   HGB 14.5 02/28/2022   HCT 43.8 02/28/2022   MCV 85.4 02/28/2022   PLT 295.0 02/28/2022   Lab Results  Component Value Date   CREATININE 0.99 02/28/2022   BUN 15 02/28/2022   NA  141 02/28/2022   K 4.2 02/28/2022   CL 104 02/28/2022   CO2 29 02/28/2022   Lab Results  Component Value Date   ALT 20 02/28/2022   AST 24 02/28/2022   ALKPHOS 73 02/28/2022   BILITOT 0.4 02/28/2022   Lab Results  Component Value Date   CHOL 262 (H) 02/19/2022   HDL 72 02/19/2022   LDLCALC 161 (H) 02/19/2022   TRIG 163 (H) 02/19/2022   CHOLHDL 3.6 02/19/2022    Lab Results  Component Value Date   HGBA1C 6.2 02/28/2022    Assessment & Plan    1. CAD/chest pain: Cath in 2021 showed nonobstructive 40% stenosis in the proximal to mid LAD, moderate nonobstructive 50% disease in the left circumflex artery; medical therapy was advised.  Recent Lexiscan Myoview was negative for ischemia, normal LV function. She reports ongoing left-sided chest discomfort which she describes as a cramping, with occasional radiation to her left arm, that lasts for seconds to minutes and occurs at rest.  Recent ischemic evaluation reassuring.  Additionally, she denies exertional symptoms.  She did not tolerate Imdur.  Discussed possible addition of amlodipine, however, she declines at this time. Reviewed ED precautions. Continue Praluent.    2. Hyperlipidemia: Statin intolerant. She did not tolerate Repatha. LDL was 161 in March 2022. Leqvio was recommended but never started.  She was recently started on Praluent.  Will  return for fasting lipid panel.  Continue Praluent.   3. COPD/OSA/persistent cough: Suspected sleep apnea per pulmonology.  She denies worsening dyspnea, however she does report a longstanding persistent cough.  LV function was normal on recent stress test. Additionally, recent CT chest was stable. Repeat PFTs pending per pulmonology.  Recommend follow-up with pulmonology.  Recommend follow-up with pulmonology.   4. Disposition: Follow-up in March 2024 with Dr. Debara Pickett, sooner if needed.   Lenna Sciara, NP 04/25/2022, 1:31 PM

## 2022-04-25 ENCOUNTER — Encounter: Payer: Self-pay | Admitting: Nurse Practitioner

## 2022-04-25 ENCOUNTER — Ambulatory Visit: Payer: Medicare HMO | Admitting: Nurse Practitioner

## 2022-04-25 VITALS — BP 130/72 | HR 84 | Ht 64.0 in | Wt 127.8 lb

## 2022-04-25 DIAGNOSIS — J449 Chronic obstructive pulmonary disease, unspecified: Secondary | ICD-10-CM

## 2022-04-25 DIAGNOSIS — R053 Chronic cough: Secondary | ICD-10-CM

## 2022-04-25 DIAGNOSIS — I2510 Atherosclerotic heart disease of native coronary artery without angina pectoris: Secondary | ICD-10-CM | POA: Diagnosis not present

## 2022-04-25 DIAGNOSIS — R079 Chest pain, unspecified: Secondary | ICD-10-CM

## 2022-04-25 DIAGNOSIS — G4733 Obstructive sleep apnea (adult) (pediatric): Secondary | ICD-10-CM

## 2022-04-25 DIAGNOSIS — E785 Hyperlipidemia, unspecified: Secondary | ICD-10-CM | POA: Diagnosis not present

## 2022-04-25 NOTE — Patient Instructions (Signed)
Medication Instructions:  Your physician recommends that you continue on your current medications as directed. Please refer to the Current Medication list given to you today.   *If you need a refill on your cardiac medications before your next appointment, please call your pharmacy*   Lab Work: Your physician recommends that you return for lab work in to get lipid panel drawn. Orders were previously placed.   If you have labs (blood work) drawn today and your tests are completely normal, you will receive your results only by: [facility] (if you have MyChart) OR A paper copy in the mail If you have any lab test that is abnormal or we need to change your treatment, we will call you to review the results.   Testing/Procedures: NONE ordered at this time of appointment     Follow-Up: At [facility], you and your health needs are our priority.  As part of our continuing mission to provide you with exceptional heart care, we have created designated Provider Care Teams.  These Care Teams include your primary Cardiologist (physician) and Advanced Practice Providers (APPs -  Physician Assistants and Nurse Practitioners) who all work together to provide you with the care you need, when you need it.  We recommend signing up for the patient portal called "MyChart".  Sign up information is provided on this After Visit Summary.  MyChart is used to connect with patients for Virtual Visits (Telemedicine).  Patients are able to view lab/test results, encounter notes, upcoming appointments, etc.  Non-urgent messages can be sent to your provider as well.   To learn more about what you can do with MyChart, go to [email or web address].    Your next appointment:   March 2024   The format for your next appointment:   In Person  Provider:   Pixie Casino, MD     Other Instructions   Important Information About Sugar

## 2022-04-29 DIAGNOSIS — E785 Hyperlipidemia, unspecified: Secondary | ICD-10-CM | POA: Diagnosis not present

## 2022-04-29 LAB — LIPID PANEL
Chol/HDL Ratio: 2.6 ratio (ref 0.0–4.4)
Cholesterol, Total: 178 mg/dL (ref 100–199)
HDL: 69 mg/dL (ref >39)
LDL Chol Calc (NIH): 85 mg/dL (ref 0–99)
Triglycerides: 140 mg/dL (ref 0–149)
VLDL Cholesterol Cal: 24 mg/dL (ref 5–40)

## 2022-04-29 NOTE — Progress Notes (Signed)
Please notify patient CT chest was stable and there was no change to the subcarinal mass, consistent with a benign process. Thanks.

## 2022-04-30 ENCOUNTER — Telehealth: Payer: Self-pay | Admitting: Pharmacist

## 2022-04-30 NOTE — Telephone Encounter (Signed)
Left message on machine to discuss lab results

## 2022-05-09 DIAGNOSIS — H26491 Other secondary cataract, right eye: Secondary | ICD-10-CM | POA: Diagnosis not present

## 2022-05-09 DIAGNOSIS — H0102B Squamous blepharitis left eye, upper and lower eyelids: Secondary | ICD-10-CM | POA: Diagnosis not present

## 2022-05-09 DIAGNOSIS — H10413 Chronic giant papillary conjunctivitis, bilateral: Secondary | ICD-10-CM | POA: Diagnosis not present

## 2022-05-09 DIAGNOSIS — H0102A Squamous blepharitis right eye, upper and lower eyelids: Secondary | ICD-10-CM | POA: Diagnosis not present

## 2022-05-09 DIAGNOSIS — Z961 Presence of intraocular lens: Secondary | ICD-10-CM | POA: Diagnosis not present

## 2022-05-09 DIAGNOSIS — H40013 Open angle with borderline findings, low risk, bilateral: Secondary | ICD-10-CM | POA: Diagnosis not present

## 2022-05-09 DIAGNOSIS — H469 Unspecified optic neuritis: Secondary | ICD-10-CM | POA: Diagnosis not present

## 2022-05-09 DIAGNOSIS — H35372 Puckering of macula, left eye: Secondary | ICD-10-CM | POA: Diagnosis not present

## 2022-06-02 ENCOUNTER — Ambulatory Visit: Payer: Medicare HMO | Admitting: Nurse Practitioner

## 2022-06-02 ENCOUNTER — Ambulatory Visit (INDEPENDENT_AMBULATORY_CARE_PROVIDER_SITE_OTHER): Payer: Medicare HMO | Admitting: Pulmonary Disease

## 2022-06-02 DIAGNOSIS — J449 Chronic obstructive pulmonary disease, unspecified: Secondary | ICD-10-CM

## 2022-06-02 LAB — PULMONARY FUNCTION TEST
DL/VA % pred: 67 %
DL/VA: 2.76 ml/min/mmHg/L
DLCO cor % pred: 53 %
DLCO cor: 9.75 ml/min/mmHg
DLCO unc % pred: 53 %
DLCO unc: 9.75 ml/min/mmHg
FEF 25-75 Post: 1.09 L/sec
FEF 25-75 Pre: 0.86 L/sec
FEF2575-%Change-Post: 27 %
FEF2575-%Pred-Post: 72 %
FEF2575-%Pred-Pre: 57 %
FEV1-%Change-Post: 5 %
FEV1-%Pred-Post: 90 %
FEV1-%Pred-Pre: 86 %
FEV1-Post: 1.76 L
FEV1-Pre: 1.68 L
FEV1FVC-%Change-Post: 6 %
FEV1FVC-%Pred-Pre: 88 %
FEV6-%Change-Post: 0 %
FEV6-%Pred-Post: 100 %
FEV6-%Pred-Pre: 100 %
FEV6-Post: 2.5 L
FEV6-Pre: 2.49 L
FEV6FVC-%Change-Post: 1 %
FEV6FVC-%Pred-Post: 105 %
FEV6FVC-%Pred-Pre: 103 %
FVC-%Change-Post: -1 %
FVC-%Pred-Post: 95 %
FVC-%Pred-Pre: 97 %
FVC-Post: 2.5 L
FVC-Pre: 2.54 L
Post FEV1/FVC ratio: 70 %
Post FEV6/FVC ratio: 100 %
Pre FEV1/FVC ratio: 66 %
Pre FEV6/FVC Ratio: 98 %
RV % pred: 116 %
RV: 2.67 L
TLC % pred: 100 %
TLC: 4.94 L

## 2022-06-02 NOTE — Progress Notes (Signed)
Full PFT Performed Today  

## 2022-06-02 NOTE — Patient Instructions (Signed)
Full PFT Performed Today  

## 2022-06-09 ENCOUNTER — Ambulatory Visit: Payer: Medicare HMO | Admitting: Nurse Practitioner

## 2022-06-09 ENCOUNTER — Encounter: Payer: Self-pay | Admitting: Nurse Practitioner

## 2022-06-09 VITALS — BP 114/68 | HR 85 | Temp 97.8°F | Ht 64.0 in | Wt 128.4 lb

## 2022-06-09 DIAGNOSIS — J449 Chronic obstructive pulmonary disease, unspecified: Secondary | ICD-10-CM | POA: Diagnosis not present

## 2022-06-09 DIAGNOSIS — J9859 Other diseases of mediastinum, not elsewhere classified: Secondary | ICD-10-CM

## 2022-06-09 DIAGNOSIS — I25119 Atherosclerotic heart disease of native coronary artery with unspecified angina pectoris: Secondary | ICD-10-CM | POA: Diagnosis not present

## 2022-06-09 MED ORDER — STIOLTO RESPIMAT 2.5-2.5 MCG/ACT IN AERS: 2.0000 | INHALATION_SPRAY | Freq: Every day | RESPIRATORY_TRACT | 5 refills | Status: DC

## 2022-06-09 MED ORDER — STIOLTO RESPIMAT 2.5-2.5 MCG/ACT IN AERS: 2.0000 | INHALATION_SPRAY | Freq: Every day | RESPIRATORY_TRACT | 0 refills | Status: DC

## 2022-06-09 NOTE — Patient Instructions (Addendum)
Stop Breztri. Start Stiolto 2 puffs daily Continue Albuterol inhaler 2 puffs every 6 hours as needed for shortness of breath or wheezing. Notify if symptoms persist despite rescue inhaler/neb use. Continue Claritin 10 mg daily for allergies Continue Flonase 2 sprays each nostril daily  Follow up in 3 months with Dr. Erin Fulling. If symptoms do not improve or worsen, please contact office for sooner follow up or seek emergency care.

## 2022-06-09 NOTE — Progress Notes (Signed)
$'@Patient'e$  ID: Wendy Patrick, female    DOB: 04/24/1946, 76 y.o.   MRN: [id number]  Chief Complaint  Patient presents with   Follow-up    She wants to talk about PFT., and breathing is about the same.     Referring provider: Hoyt Koch, *  HPI: 76 year old female, former smoker (80 pack years) followed for COPD with emphysema and mediastinal mass.  She is a patient of Dr. August Albino and last seen in office 03/31/2022.  Past medical history significant for CAD, allergic rhinitis, GERD, cervical radiculopathy, HLD, depression with anxiety, fatigue.  TEST/EVENTS:  10/10/2014 echocardiogram: EF 60-65%.  Overall normal exam. 04/17/2020 heart catheterization: Nonobstructive coronary artery disease with mild nonobstructive stenosis of the proximal to mid LAD and moderate nonobstructive stenosis of the mid left circumflex, confirmed to be nonobstructive by invasive RFR evaluation.  Normal LV EDP 06/19/2020 CT chest with contrast: Subcarinal 2.8 x 2.1 cm soft tissue density mass is stable since 04/04/2020 coronary CT.  Differentials include pathological subcarinal lymph node versus dense bronchopulmonary or esophageal duplication cyst.  There is mild AP window lymphadenopathy present.  There is marked centrilobular emphysema with diffuse bronchial wall thickening, suggesting COPD.  No lung masses or significant pulmonary nodules identified.  There is atherosclerosis.  Small hiatal hernia is present.  Bilateral adrenal adenomas are also noted. 06/28/2020 PET scan: No hypermetabolism to correspond to the subcarinal mass, which may have decreased slightly in size.  Differential considerations include reactive node or complex bronchogenic cyst or GI duplication cyst.  Recommended CT follow-up in 3 to 6 months.  There is a left adrenal adenoma.  There is focal hypermetabolism of the transverse colon for which polyp or mass cannot be excluded; correlate with colon cancer screening history and consider  colonoscopy if not up-to-date.  There is an incidental finding of a atherosclerosis and CAD as well as emphysema. 08/17/2020 PFTs: FVC 84, FEV1 72, ratio 64, TLC 96%, DLCO 46%.  Moderate obstructive airway disease with severe diffusion defect. 12/04/2020 CT chest with contrast: There is atherosclerosis.  There is an unchanged subcarinal soft tissue attenuation mass measuring 3.0 x 2.3 cm.  Numerous prominent mediastinal and bilateral hilar lymph nodes are unchanged.  There is a developmental variant of a prominent azygous fissure of the right upper lobe.  Moderate to severe centrilobular emphysema is present.  There is hepatic steatosis. 04/14/2022 CT chest: Extensive atherosclerosis.  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  No pathologic adenopathy.  There is severe upper lobe predominant emphysematous changes again noted.  Subpleural scarring at the lung bases.  There is a stable 2 cm left adrenal nodule. 06/02/2022 PFTs: FVC 97, FEV1 86, ratio 70, TLC 100, DLCO 53  03/27/2021: OV with Dr. Erin Fulling.  Compensated on current regimen with Anoro.  Allergic rhinitis stable with Flonase, Claritin, Astelin and Atrovent spray.  Does report that she is having some throat irritation and trouble swallowing.  Has plans for barium swallow, which was scheduled by GI yesterday.  Currently on Protonix for possible GERD symptoms.  Discussed a possible home sleep study to evaluate for OSA given her progressive fatigue, patient declined.  Subcarinal mass has been stable on previous imaging and had no hypermetabolism on previous PET.  03/31/2022: OV with Doyle Tegethoff NP for overdue follow-up.  She has been having a ongoing cough for the last 5 years.  Reports that it has been progressively worse over the last year.  It  is primarily dry throughout the day ; she does have some increased sputum production that is clear in the mornings.  Her DOE has also worsened over the last year.   Feels like her activity tolerance is not what it used to be.  She has had some pain that goes up and down her left arm and feels like a shooting sensation; ongoing for around a year.  She has also had some associated chest tightness and left side.  She was seen by cardiology and told that they thought this was related to her lungs or subcarinal mass.  EKG was normal but otherwise had no further work-up.  She also has a history of cervical radiculopathy but has not been evaluated by neurosurgery for this. She is not having any pain today. She denies any hemoptysis, anorexia, lower extremity edema, orthopnea, PND.  Weight has been stable since we saw her last.  She continues on Anoro daily.  Feels like her nasal congestion and drainage is stable on current regimen.  She is very concerned about the mass in her chest, which was previously considered to be benign.  CT chest was ordered for further evaluation which showed no new findings and stable subcarinal mass.  PFTs were ordered for further evaluation.  Recommended trial step up in therapy to [facility].  Concerned that this could be unstable angina and advised to seek emergency care if pain recurs.  Advised to follow-up with cardiology.  Also discussed that it could be associated to her cervical radiculopathy, however would not expect to see left-sided chest tightness or pain with this.  06/09/2022: Today-follow-up Patient presents today for follow-up after undergoing pulmonary function testing and CT scan with her husband.  Her CT scan was overall stable with no acute findings.  Her pulmonary function testing was actually improved when compared to a year ago.  She has mild obstruction with a moderate to severe diffusion defect, but FVC, FEV1 and DLCO are all improved.  Today, she reports feeling well.  She feels like the chest tightness/pain has subsided some.  Has not been as occurring as often.  She did try the [facility] for a few weeks but then stopped it because  she felt it did not make any difference when compared to the Anoro.  She has not been on any inhaler regimen since.  She still has a daily, congested cough which is unchanged.  Most of her shortness of breath is with climbing/long distances.  Feels like albuterol has worked the best for her.  She denies any recent wheezing, hemoptysis, weight loss, anorexia, palpitations.  She was seen by cardiology after our last visit; recent ischemic evaluation was reassuring.  They did recommend possible addition of amlodipine however patient declined this.  She was recommended on ED precautions as well and continued on Praluent.  Regarding her fatigue, she previously declined a home sleep study for evaluation of this.  She says that since her last visit she started taking Benadryl at bedtime and feels like she sleeps the best that she has in years.  She wakes up feeling more restful and does not feel nearly as tired throughout the day.  She denies any drowsiness, dizziness or unsteadiness the next day.  Her husband is the primary driver out of the 2 of them.  Allergies  Allergen Reactions   Bevespi Aerosphere [Glycopyrrolate-Formoterol] Other (See Comments)    Patient felt like her throat was closing up   Atorvastatin Other (See Comments)    myalgia  Crestor [Rosuvastatin] Other (See Comments)    Leg cramps   Duloxetine Diarrhea   Lamotrigine Rash   Latex Rash   Statins Other (See Comments)    Leg cramps    Immunization History  Administered Date(s) Administered   Pneumococcal Conjugate-13 06/05/2016   Pneumococcal Polysaccharide-23 12/01/2009, 08/02/2019, 08/22/2019   Tdap 12/01/2010   Zoster, Live 12/02/2011    Past Medical History:  Diagnosis Date   AIN grade I    COPD (chronic obstructive pulmonary disease) (HCC)    Coronary artery disease 08/2009   40% mid left circ by cath in [place], [place]   Dyspnea    with heavy exertion   Endometriosis    1986   Endometriosis yrs ago   Family  history of adverse reaction to anesthesia    sister ponv   GERD (gastroesophageal reflux disease)    Granuloma annulare    Gum lesion    per pt left jawborn has small knot on gum on back tooth, is not painful or draining   Headache    History of gestational diabetes many  yrs ago   History of hiatal hernia    slight hh   History of kidney stones 02/24/2011   passed on own   History of migraine yrs ago   Hyperlipidemia    Optic neuritis    02/23/2014   Optic neuritis Novembr   Trigeminal neuralgia    Urinary urgency    Vaginal anomaly    tears and bleeds easily    Tobacco History: Social History   Tobacco Use  Smoking Status Former   Packs/day: 2.00   Years: 40.00   Total pack years: 80.00   Types: Cigarettes   Quit date: 02/05/1996   Years since quitting: 26.3  Smokeless Tobacco Never  Tobacco Comments   Educated LDCT for lung Ca to discuss with MD if interested   Counseling given: Not Answered Tobacco comments: Educated LDCT for lung Ca to discuss with MD if interested   Outpatient Medications Prior to Visit  Medication Sig Dispense Refill   Alirocumab (PRALUENT) 75 MG/ML SOAJ Inject 75 mg into the skin every 14 (fourteen) days. 2 mL 11   Ascorbic Acid (VITAMIN C) 1000 MG tablet Take 1,000 mg by mouth daily.     cholecalciferol (VITAMIN D) 1000 units tablet Take 10,000 Units by mouth daily. Vitamin d 3     diphenhydrAMINE (BENADRYL) 25 MG tablet Take 25 mg by mouth at bedtime.     KRILL OIL PO Take 250 mg by mouth daily.     Loratadine 10 MG CAPS Take 10 mg by mouth daily.     Multiple Vitamins-Minerals (CENTRUM SILVER PO) Take by mouth daily at 2 PM.     Azelastine HCl 137 MCG/SPRAY SOLN SPRAY TWO SPRAYS IN EACH NOSTRIL TWICE DAILY. USE IN EACH NOSTRIL AS DIRECTED (Patient not taking: Reported on 06/09/2022) 30 mL 12   Budeson-Glycopyrrol-Formoterol (BREZTRI AEROSPHERE) 160-9-4.8 MCG/ACT AERO Inhale 2 puffs into the lungs in the morning and at bedtime. (Patient not taking:  Reported on 06/09/2022) 10.7 g 5   No facility-administered medications prior to visit.     Review of Systems:   Constitutional: No weight loss or gain, night sweats, fevers, chills. +fatigue (chronic, improved) HEENT: +difficulty swallowing  (unchanged; cleared by GI with normal barium swallow). no headaches, tooth/dental problems, or sore throat. No sneezing, itching, ear ache, nasal congestion, or post nasal drip CV:  +left sided chest pain/tightness with associated arm pain (intermittent; happens  at rest; slightly improved). No orthopnea, PND, swelling in lower extremities, anasarca, dizziness, palpitations, syncope Resp:+shortness of breath with exertion (stable); chronic cough (stable). No excess mucus or change in color of mucus. No hemoptysis. No wheezing.  No chest wall deformity GI:  No heartburn, indigestion, abdominal pain, nausea, vomiting, diarrhea, change in bowel habits, loss of appetite, bloody stools.  Skin: No rash, lesions, ulcerations MSK:  No joint pain or swelling.  No decreased range of motion.  No back pain. +left arm sharp pain (radiates down arm; intermittent; not currently experiencing any pain) Neuro: No dizziness or lightheadedness.  Psych: No depression or anxiety. Mood stable.     Physical Exam:  BP 114/68 (BP Location: Left Arm, Cuff Size: Normal)   Pulse 85   Temp 97.8 F (36.6 C) (Oral)   Ht '5\' 4"'$  (1.626 m)   Wt 128 lb 6.4 oz (58.2 kg)   SpO2 98%   BMI 22.04 kg/m   GEN: Pleasant, interactive, well-appearing, well-nourished; in no acute distress. HEENT:  Normocephalic and atraumatic. PERRLA. Sclera white. Nasal turbinates pink, moist and patent bilaterally. No rhinorrhea present. Oropharynx pink and moist, without exudate or edema. No lesions, ulcerations, or postnasal drip.  NECK:  Supple w/ fair ROM. No JVD present. Normal carotid impulses w/o bruits. Thyroid symmetrical with no goiter or nodules palpated. No lymphadenopathy.   CV: RRR, no m/r/g,  no peripheral edema. Pulses intact, +2 bilaterally. No cyanosis, pallor or clubbing. PULMONARY:  Unlabored, regular breathing. Clear bilaterally A&P w/o wheezes/rales/rhonchi. No accessory muscle use. No dullness to percussion. GI: BS present and normoactive. Soft, non-tender to palpation. No organomegaly or masses detected. No CVA tenderness. MSK: No erythema, warmth or tenderness. Cap refil <2 sec all extrem. No deformities or joint swelling noted.  Neuro: A/Ox3. No focal deficits noted.   Skin: Warm, no lesions or rashe Psych: Normal affect and behavior. Judgement and thought content appropriate.     Lab Results:  CBC    Component Value Date/Time   WBC 5.9 02/28/2022 0855   RBC 5.13 (H) 02/28/2022 0855   HGB 14.5 02/28/2022 0855   HGB 14.3 04/13/2020 1047   HCT 43.8 02/28/2022 0855   HCT 44.5 04/13/2020 1047   PLT 295.0 02/28/2022 0855   PLT 375 04/13/2020 1047   MCV 85.4 02/28/2022 0855   MCV 84 04/13/2020 1047   MCH 26.9 04/13/2020 1047   MCH 28.3 10/10/2014 0834   MCHC 33.1 02/28/2022 0855   RDW 14.4 02/28/2022 0855   RDW 12.7 04/13/2020 1047   LYMPHSABS 1.6 03/08/2018 0932   MONOABS 0.7 03/08/2018 0932   EOSABS 0.2 03/08/2018 0932   BASOSABS 0.0 03/08/2018 0932    BMET    Component Value Date/Time   NA 141 02/28/2022 0855   NA 141 04/13/2020 1047   K 4.2 02/28/2022 0855   CL 104 02/28/2022 0855   CO2 29 02/28/2022 0855   GLUCOSE 96 02/28/2022 0855   BUN 15 02/28/2022 0855   BUN 15 04/13/2020 1047   CREATININE 0.99 02/28/2022 0855   CALCIUM 9.5 02/28/2022 0855   GFRNONAA 57 (L) 04/13/2020 1047   GFRAA 65 04/13/2020 1047    BNP No results found for: "BNP"   Imaging:  No results found.       Latest Ref Rng & Units 06/02/2022    8:42 AM 08/17/2020   10:45 AM  PFT Results  FVC-Pre L 2.54  P 2.28   FVC-Predicted Pre % 97  P 84   FVC-Post  L 2.50  P 1.89   FVC-Predicted Post % 95  P 70   Pre FEV1/FVC % % 66  P 64   Post FEV1/FCV % % 70  P 67    FEV1-Pre L 1.68  P 1.46   FEV1-Predicted Pre % 86  P 72   FEV1-Post L 1.76  P 1.27   DLCO uncorrected ml/min/mmHg 9.75  P 8.53   DLCO UNC% % 53  P 46   DLCO corrected ml/min/mmHg 9.75  P 8.53   DLCO COR %Predicted % 53  P 46   DLVA Predicted % 67  P 63   TLC L 4.94  P 4.75   TLC % Predicted % 100  P 96   RV % Predicted % 116  P 128     P Preliminary result    No results found for: "NITRICOXIDE"      Assessment & Plan:   COPD (chronic obstructive pulmonary disease) Currently off maintenance inhalers.  She underwent pulmonary function testing which is actually improved when compared to previous.  She did not notice any significant change with Breztri and did not have any bronchodilator response so advised that we could return to LAMA/LABA therapy.  She did not want to go back on Anoro as she had throat irritation and felt like her cough was actually worse on it.  Recommended that we try her on Stiolto.  Provided her with 2 samples today and sent Rx.  She will notify us if she has any issues with affordability.  Reviewed the importance of maintenance therapy and the difference tween maintenance and rescue.  Continue albuterol as needed.  Patient Instructions  Stop Breztri. Start Stiolto 2 puffs daily Continue Albuterol inhaler 2 puffs every 6 hours as needed for shortness of breath or wheezing. Notify if symptoms persist despite rescue inhaler/neb use. Continue Claritin 10 mg daily for allergies Continue Flonase 2 sprays each nostril daily  Follow up in 3 months with Dr. Erin Fulling. If symptoms do not improve or worsen, please contact office for sooner follow up or seek emergency care.     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  Considered benign.  Coronary artery disease She has intermittent left chest pain/cramping that will radiate down her left arm at times.  Has not had any significant events since we saw her last.   Her pulmonary work-up was relatively unremarkable.  No evidence that this is correlated to her subcarinal mass or COPD.  Recent ischemic work-up was reassuring per cardiology's most recent note.  Patient declined further pharmacological therapy.  She was advised by cardiology to continue on Praluent.  She is aware of ED precautions.    I spent 31 minutes of dedicated to the care of this patient on the date of this encounter to include pre-visit review of records, face-to-face time with the patient discussing conditions above, post visit ordering of testing, clinical documentation with the electronic health record, making appropriate referrals as documented, and communicating necessary findings to members of the patients care team.  Wendy Bibles, NP 06/09/2022  Pt aware and understands NP's role.

## 2022-06-09 NOTE — Assessment & Plan Note (Addendum)
She has intermittent left chest pain/cramping that will radiate down her left arm at times.  Has not had any significant events since we saw her last.  Her pulmonary work-up was relatively unremarkable.  No evidence that this is correlated to her subcarinal mass or COPD.  Recent ischemic work-up was reassuring per cardiology's most recent note.  Patient declined further pharmacological therapy.  She was advised by cardiology to continue on Praluent.  She is aware of ED precautions.

## 2022-06-09 NOTE — Assessment & Plan Note (Signed)
Currently off maintenance inhalers.  She underwent pulmonary function testing which is actually improved when compared to previous.  She did not notice any significant change with Breztri and did not have any bronchodilator response so advised that we could return to LAMA/LABA therapy.  She did not want to go back on Anoro as she had throat irritation and felt like her cough was actually worse on it.  Recommended that we try her on Stiolto.  Provided her with 2 samples today and sent Rx.  She will notify us if she has any issues with affordability.  Reviewed the importance of maintenance therapy and the difference tween maintenance and rescue.  Continue albuterol as needed.  Patient Instructions  Stop Breztri. Start Stiolto 2 puffs daily Continue Albuterol inhaler 2 puffs every 6 hours as needed for shortness of breath or wheezing. Notify if symptoms persist despite rescue inhaler/neb use. Continue Claritin 10 mg daily for allergies Continue Flonase 2 sprays each nostril daily  Follow up in 3 months with Dr. Erin Fulling. If symptoms do not improve or worsen, please contact office for sooner follow up or seek emergency care.

## 2022-06-09 NOTE — Assessment & Plan Note (Addendum)
Recent CT chest reassuring.  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  Considered benign.

## 2022-06-23 ENCOUNTER — Ambulatory Visit (INDEPENDENT_AMBULATORY_CARE_PROVIDER_SITE_OTHER): Payer: Medicare HMO

## 2022-06-23 DIAGNOSIS — Z13820 Encounter for screening for osteoporosis: Secondary | ICD-10-CM | POA: Diagnosis not present

## 2022-06-23 DIAGNOSIS — Z0000 Encounter for general adult medical examination without abnormal findings: Secondary | ICD-10-CM | POA: Diagnosis not present

## 2022-06-23 DIAGNOSIS — Z1239 Encounter for other screening for malignant neoplasm of breast: Secondary | ICD-10-CM | POA: Diagnosis not present

## 2022-06-23 NOTE — Patient Instructions (Signed)
Wendy Patrick , Thank you for taking time to come for your Medicare Wellness Visit. I appreciate your ongoing commitment to your health goals. Please review the following plan we discussed and let me know if I can assist you in the future.   Screening recommendations/referrals: Colonoscopy: 09/10/2020; due every 3 years Mammogram: order placed for [facility] Bone Density: order placed for [facility] Recommended yearly ophthalmology/optometry visit for glaucoma screening and checkup Recommended yearly dental visit for hygiene and checkup  Vaccinations: Influenza vaccine: declined Pneumococcal vaccine: 06/05/2016, 08/22/2019 Tdap vaccine: due Shingles vaccine: declined Covid-19: declined  Advanced directives: Yes; Please bring a copy of your health care power of attorney and living will to the office at your convenience.  Conditions/risks identified: Yes  Next appointment: Please schedule your next Medicare Wellness Visit with your Nurse Health Advisor in 1 year by calling [phone].   Preventive Care 78 Years and Older, Female Preventive care refers to lifestyle choices and visits with your health care provider that can promote health and wellness. What does preventive care include? A yearly physical exam. This is also called an annual well check. Dental exams once or twice a year. Routine eye exams. Ask your health care provider how often you should have your eyes checked. Personal lifestyle choices, including: Daily care of your teeth and gums. Regular physical activity. Eating a healthy diet. Avoiding tobacco and drug use. Limiting alcohol use. Practicing safe sex. Taking low-dose aspirin every day. Taking vitamin and mineral supplements as recommended by your health care provider. What happens during an annual well check? The services and screenings done by your health care provider during your annual well check will depend on your age, overall health, lifestyle  risk factors, and family history of disease. Counseling  Your health care provider may ask you questions about your: Alcohol use. Tobacco use. Drug use. Emotional well-being. Home and relationship well-being. Sexual activity. Eating habits. History of falls. Memory and ability to understand (cognition). Work and work Statistician. Reproductive health. Screening  You may have the following tests or measurements: Height, weight, and BMI. Blood pressure. Lipid and cholesterol levels. These may be checked every 5 years, or more frequently if you are over 78 years old. Skin check. Lung cancer screening. You may have this screening every year starting at age 78 if you have a 30-pack-year history of smoking and currently smoke or have quit within the past 15 years. Fecal occult blood test (FOBT) of the stool. You may have this test every year starting at age 78. Flexible sigmoidoscopy or colonoscopy. You may have a sigmoidoscopy every 5 years or a colonoscopy every 10 years starting at age 78. Hepatitis C blood test. Hepatitis B blood test. Sexually transmitted disease (STD) testing. Diabetes screening. This is done by checking your blood sugar (glucose) after you have not eaten for a while (fasting). You may have this done every 1-3 years. Bone density scan. This is done to screen for osteoporosis. You may have this done starting at age 78. Mammogram. This may be done every 1-2 years. Talk to your health care provider about how often you should have regular mammograms. Talk with your health care provider about your test results, treatment options, and if necessary, the need for more tests. Vaccines  Your health care provider may recommend certain vaccines, such as: Influenza vaccine. This is recommended every year. Tetanus, diphtheria, and acellular pertussis (Tdap, Td) vaccine. You may need a Td booster every 10 years. Zoster vaccine. You may need  this after age 78. Pneumococcal  13-valent conjugate (PCV13) vaccine. One dose is recommended after age 78. Pneumococcal polysaccharide (PPSV23) vaccine. One dose is recommended after age 78. Talk to your health care provider about which screenings and vaccines you need and how often you need them. This information is not intended to replace advice given to you by your health care provider. Make sure you discuss any questions you have with your health care provider. Document Released: 12/14/2015 Document Revised: 08/06/2016 Document Reviewed: 09/18/2015 Elsevier Interactive Patient Education  2017 [place] Prevention in the Home Falls can cause injuries. They can happen to people of all ages. There are many things you can do to make your home safe and to help prevent falls. What can I do on the outside of my home? Regularly fix the edges of walkways and driveways and fix any cracks. Remove anything that might make you trip as you walk through a door, such as a raised step or threshold. Trim any bushes or trees on the path to your home. Use bright outdoor lighting. Clear any walking paths of anything that might make someone trip, such as rocks or tools. Regularly check to see if handrails are loose or broken. Make sure that both sides of any steps have handrails. Any raised decks and porches should have guardrails on the edges. Have any leaves, snow, or ice cleared regularly. Use sand or salt on walking paths during winter. Clean up any spills in your garage right away. This includes oil or grease spills. What can I do in the bathroom? Use night lights. Install grab bars by the toilet and in the tub and shower. Do not use towel bars as grab bars. Use non-skid mats or decals in the tub or shower. If you need to sit down in the shower, use a plastic, non-slip stool. Keep the floor dry. Clean up any water that spills on the floor as soon as it happens. Remove soap buildup in the tub or shower regularly. Attach  bath mats securely with double-sided non-slip rug tape. Do not have throw rugs and other things on the floor that can make you trip. What can I do in the bedroom? Use night lights. Make sure that you have a light by your bed that is easy to reach. Do not use any sheets or blankets that are too big for your bed. They should not hang down onto the floor. Have a firm chair that has side arms. You can use this for support while you get dressed. Do not have throw rugs and other things on the floor that can make you trip. What can I do in the kitchen? Clean up any spills right away. Avoid walking on wet floors. Keep items that you use a lot in easy-to-reach places. If you need to reach something above you, use a strong step stool that has a grab bar. Keep electrical cords out of the way. Do not use floor polish or wax that makes floors slippery. If you must use wax, use non-skid floor wax. Do not have throw rugs and other things on the floor that can make you trip. What can I do with my stairs? Do not leave any items on the stairs. Make sure that there are handrails on both sides of the stairs and use them. Fix handrails that are broken or loose. Make sure that handrails are as long as the stairways. Check any carpeting to make sure that it is firmly attached to  the stairs. Fix any carpet that is loose or worn. Avoid having throw rugs at the top or bottom of the stairs. If you do have throw rugs, attach them to the floor with carpet tape. Make sure that you have a light switch at the top of the stairs and the bottom of the stairs. If you do not have them, ask someone to add them for you. What else can I do to help prevent falls? Wear shoes that: Do not have high heels. Have rubber bottoms. Are comfortable and fit you well. Are closed at the toe. Do not wear sandals. If you use a stepladder: Make sure that it is fully opened. Do not climb a closed stepladder. Make sure that both sides of the  stepladder are locked into place. Ask someone to hold it for you, if possible. Clearly mark and make sure that you can see: Any grab bars or handrails. First and last steps. Where the edge of each step is. Use tools that help you move around (mobility aids) if they are needed. These include: Canes. Walkers. Scooters. Crutches. Turn on the lights when you go into a dark area. Replace any light bulbs as soon as they burn out. Set up your furniture so you have a clear path. Avoid moving your furniture around. If any of your floors are uneven, fix them. If there are any pets around you, be aware of where they are. Review your medicines with your doctor. Some medicines can make you feel dizzy. This can increase your chance of falling. Ask your doctor what other things that you can do to help prevent falls. This information is not intended to replace advice given to you by your health care provider. Make sure you discuss any questions you have with your health care provider. Document Released: 09/13/2009 Document Revised: 04/24/2016 Document Reviewed: 12/22/2014 Elsevier Interactive Patient Education  2017 [employer].

## 2022-06-23 NOTE — Progress Notes (Signed)
I connected with Wendy Patrick today by telephone and verified that I am speaking with the correct person using two identifiers. Location patient: home Location provider: work Persons participating in the virtual visit: patient, provider.   I discussed the limitations, risks, security and privacy concerns of performing an evaluation and management service by telephone and the availability of in person appointments. I also discussed with the patient that there may be a patient responsible charge related to this service. The patient expressed understanding and verbally consented to this telephonic visit.    Interactive audio and video telecommunications were attempted between this provider and patient, however failed, due to patient having technical difficulties OR patient did not have access to video capability.  We continued and completed visit with audio only.  Some vital signs may be absent or patient reported.   Time Spent with patient on telephone encounter: 30 minutes  Subjective:   Wendy Patrick is a 76 y.o. female who presents for Medicare Annual (Subsequent) preventive examination.  Review of Systems     Cardiac Risk Factors include: advanced age (>63mn, >>6women);dyslipidemia;family history of premature cardiovascular disease     Objective:    There were no vitals filed for this visit. There is no height or weight on file to calculate BMI.     06/23/2022    9:55 AM 02/14/2021    8:15 AM 04/17/2020    7:24 AM 10/03/2019   10:39 AM 05/27/2018    8:49 AM 08/13/2016   10:31 AM 07/29/2016   10:20 AM  Advanced Directives  Does Patient Have a Medical Advance Directive? No Yes Yes Yes No Yes No  Type of Advance Directive  Living will;Healthcare Power of Attorney [place]  Does patient want to make changes to medical advance directive?  No - Patient declined No - Patient declined      Copy of [place]?  No - copy requested  No - copy requested     Would patient like information on creating a medical advance directive? No - Patient declined    Yes (ED - Information included in AVS)  No - patient declined information    Current Medications (verified) Outpatient Encounter Medications as of 06/23/2022  Medication Sig   Alirocumab (PRALUENT) 75 MG/ML SOAJ Inject 75 mg into the skin every 14 (fourteen) days.   Ascorbic Acid (VITAMIN C) 1000 MG tablet Take 1,000 mg by mouth daily.   cholecalciferol (VITAMIN D) 1000 units tablet Take 10,000 Units by mouth daily. Vitamin d 3   diphenhydrAMINE (BENADRYL) 25 MG tablet Take 25 mg by mouth at bedtime.   KRILL OIL PO Take 250 mg by mouth daily.   Loratadine 10 MG CAPS Take 10 mg by mouth daily.   Multiple Vitamins-Minerals (CENTRUM SILVER PO) Take by mouth daily at 2 PM.   Tiotropium Bromide-Olodaterol (STIOLTO RESPIMAT) 2.5-2.5 MCG/ACT AERS Inhale 2 puffs into the lungs daily.   Tiotropium Bromide-Olodaterol (STIOLTO RESPIMAT) 2.5-2.5 MCG/ACT AERS Inhale 2 puffs into the lungs daily.   No facility-administered encounter medications on file as of 06/23/2022.    Allergies (verified) Bevespi aerosphere [glycopyrrolate-formoterol], Atorvastatin, Crestor [rosuvastatin], Duloxetine, Lamotrigine, Latex, and Statins   History: Past Medical History:  Diagnosis Date   AIN grade I    [place]    Coronary artery disease 08/2009   [place]  Dyspnea    with heavy exertion   Endometriosis    1986   Endometriosis yrs ago   Family history of adverse reaction to anesthesia    sister ponv   GERD (gastroesophageal reflux disease)    Granuloma annulare    Gum lesion    per pt left jawborn has small knot on gum on back tooth, is not painful or draining   Headache    History of gestational diabetes many  yrs ago   History of hiatal hernia    slight hh    History of kidney stones February 20, 2011   passed on own   History of migraine yrs ago   Hyperlipidemia    Optic neuritis    2014-02-19   Optic neuritis Novembr   Trigeminal neuralgia    Urinary urgency    Vaginal anomaly    tears and bleeds easily   Past Surgical History:  Procedure Laterality Date   ABDOMINAL HYSTERECTOMY  age 45   CARDIAC CATHETERIZATION  02/19/09   COLONOSCOPY  12/2019   3 polyps removed 1 pre cancer   EXCISION OF SKIN TAG N/A 02/14/2021   Procedure: EXCISION OF ANAL LESIONS;  Surgeon: Leighton Ruff, MD;  Location: [facility];  Service: General;  Laterality: N/A;  63 MIN TOTAL   EYE SURGERY Bilateral 2016/02/20   both eyes cataracts lens replacement   LEFT HEART CATH AND CORONARY ANGIOGRAPHY N/A 04/17/2020   Procedure: LEFT HEART CATH AND CORONARY ANGIOGRAPHY;  Surgeon: Sherren Mocha, MD;  Location: [facility];  Service: Cardiovascular;  Laterality: N/A;   MANDIBLE SURGERY     gum tumor removed in [place] not sure if was benign per pt   MINOR HEMORRHOIDECTOMY  yrs ago   RECTAL EXAM UNDER ANESTHESIA N/A 02/14/2021   Procedure: ANAL EXAM UNDER ANESTHESIA;  Surgeon: Leighton Ruff, MD;  Location: [facility];  Service: General;  Laterality: N/A;   TUBAL LIGATION  yrs ago   UPPER GASTROINTESTINAL ENDOSCOPY  12/2019   Family History  Problem Relation Age of Onset   Cancer Mother        Breast    Breast cancer Mother    Stroke Brother        open heart surgery    Colon cancer Neg Hx    Esophageal cancer Neg Hx    Rectal cancer Neg Hx    Stomach cancer Neg Hx    Social History   Socioeconomic History   Marital status: Married    Spouse name: Wendy Patrick   Number of children: 1   Years of education: GED   Highest education level: Not on file  Occupational History   Occupation: Retired  Tobacco Use   Smoking status: Former    Packs/day: 2.00    Years: 40.00    Total pack years: 80.00    Types: Cigarettes    Quit date: 02/05/1996     Years since quitting: 26.3   Smokeless tobacco: Never   Tobacco comments:    Educated LDCT for lung Ca to discuss with MD if interested  Vaping Use   Vaping Use: Never used  Substance and Sexual Activity   Alcohol use: Yes    Alcohol/week: 3.0 standard drinks of alcohol    Types: 3 Glasses of wine per week    Comment: wine with dinner occ   Drug use: No   Sexual activity: Never    Partners: Male  Other Topics Concern   Not on file  Social History Narrative   Lives w/ husband    Caffeine use: Coffee daily   Right handed    Social Determinants of Health   Financial Resource Strain: Low Risk  (06/23/2022)   Overall Financial Resource Strain (CARDIA)    Difficulty of Paying Living Expenses: Not hard at all  Food Insecurity: No Food Insecurity (06/23/2022)   Hunger Vital Sign    Worried About Running Out of Food in the Last Year: Never true    Ran Out of Food in the Last Year: Never true  Transportation Needs: No Transportation Needs (06/23/2022)   PRAPARE - Hydrologist (Medical): No    Lack of Transportation (Non-Medical): No  Physical Activity: Sufficiently Active (06/23/2022)   Exercise Vital Sign    Days of Exercise per Week: 5 days    Minutes of Exercise per Session: 30 min  Stress: No Stress Concern Present (06/23/2022)   [place]    Feeling of Stress : Only a little  Social Connections: Socially Integrated (06/23/2022)   Social Connection and Isolation Panel [NHANES]    Frequency of Communication with Friends and Family: More than three times a week    Frequency of Social Gatherings with Friends and Family: More than three times a week    Attends Religious Services: More than 4 times per year    Active Member of [employer] or Organizations: Yes    Attends Music therapist: More than 4 times per year    Marital Status: Married    Tobacco Counseling Counseling given:  Not Answered Tobacco comments: Educated LDCT for lung Ca to discuss with MD if interested   Clinical Intake:  Pre-visit preparation completed: Yes  Pain : No/denies pain     BMI - recorded: 22.04 Nutritional Status: BMI of 19-24  Normal Nutritional Risks: None Diabetes: No  How often do you need to have someone help you when you read instructions, pamphlets, or other written materials from your doctor or pharmacy?: 1 - Never What is the last grade level you completed in school?: GED  Diabetic? no  Interpreter Needed?: No  Information entered by :: Willey Blade, LPN   Activities of Daily Living    06/23/2022   10:01 AM  In your present state of health, do you have any difficulty performing the following activities:  Hearing? 1  Vision? 0  Difficulty concentrating or making decisions? 0  Walking or climbing stairs? 0  Dressing or bathing? 0  Doing errands, shopping? 0  Preparing Food and eating ? N  Using the Toilet? N  In the past six months, have you accidently leaked urine? N  Do you have problems with loss of bowel control? N  Managing your Medications? N  Managing your Finances? N  Housekeeping or managing your Housekeeping? N    Patient Care Team: Hoyt Koch, MD as PCP - General (Internal Medicine) Debara Pickett Nadean Corwin, MD as PCP - Cardiology (Cardiology) Tanda Rockers, MD as Consulting Physician (Pulmonary Disease) Warden Fillers, MD as Consulting Physician (Ophthalmology) Pieter Partridge, DO as Consulting Physician (Neurology) Jerrell Belfast, MD as Consulting Physician (Otolaryngology) Debara Pickett Nadean Corwin, MD as Consulting Physician (Cardiology) Clarene Essex, MD as Consulting Physician (Gastroenterology)  Indicate any recent Medical Services you may have received from other than [facility] providers in the past year (date may be approximate).     Assessment:   This is a routine wellness examination for  Thayer Headings.  Hearing/Vision screen Hearing  Screening - Comments:: Patient has hearing difficulty on the left side; Needs hearing aids. Vision Screening - Comments:: Patient does wear readers.  Eye exam done by: Warden Fillers, MD.   Dietary issues and exercise activities discussed: Current Exercise Habits: Home exercise routine, Type of exercise: walking, Time (Minutes): 30, Frequency (Times/Week): 5, Weekly Exercise (Minutes/Week): 150, Intensity: Moderate, Exercise limited by: respiratory conditions(s)   Goals Addressed             This Visit's Progress    To maintain my health        Depression Screen    06/23/2022   10:00 AM 02/28/2022    8:18 AM 05/31/2021   10:38 AM 10/03/2019   10:40 AM 05/27/2018    8:57 AM 08/17/2017    8:10 AM 06/05/2016    8:04 AM  PHQ 2/9 Scores  PHQ - 2 Score 0 0 0 0 1 1 0  PHQ- 9 Score  0         Fall Risk    06/23/2022   10:00 AM 05/31/2021   10:39 AM 10/03/2019   10:40 AM 10/06/2018    3:17 PM 05/27/2018    8:57 AM  [place] in the past year? 0 0 0 1 No  Number falls in past yr: 0 0 0 1   Injury with Fall? 0 0 0 1   Risk for fall due to : No Fall Risks      Follow up Falls evaluation completed        [place]:  Any stairs in or around the home? Yes  If so, are there any without handrails? No  Home free of loose throw rugs in walkways, pet beds, electrical cords, etc? Yes  Adequate lighting in your home to reduce risk of falls? Yes   ASSISTIVE DEVICES UTILIZED TO PREVENT FALLS:  Life alert? No  Use of a cane, walker or w/c? No  Grab bars in the bathroom? Yes  Shower chair or bench in shower? Yes  Elevated toilet seat or a handicapped toilet? No   TIMED UP AND GO:  Was the test performed? Yes .  Length of time to ambulate 10 feet: n/a sec.   Appearance of gait: Gait not evaluated during this visit.  Cognitive Function:    05/27/2018    8:53 AM 05/30/2015    9:44 AM  MMSE - Mini Mental State Exam  Not completed:  Unable to  complete  Orientation to time 5   Orientation to Place 5   Registration 3   Attention/ Calculation 3   Recall 3   Language- name 2 objects 2   Language- repeat 1   Language- follow 3 step command 3   Language- read & follow direction 1   Write a sentence 1   Copy design 1   Total score 28         06/23/2022   10:08 AM 10/03/2019   11:09 AM  6CIT Screen  What Year? 0 points 0 points  What month? 0 points 0 points  What time? 0 points 0 points  Count back from 20 0 points 0 points  Months in reverse 0 points 0 points  Repeat phrase 0 points 4 points  Total Score 0 points 4 points    Immunizations Immunization History  Administered Date(s) Administered   Pneumococcal Conjugate-13 06/05/2016   Pneumococcal Polysaccharide-23 12/01/2009, 08/02/2019, 08/22/2019  Tdap 12/01/2010   Zoster, Live 12/02/2011    TDAP status: Due, Education has been provided regarding the importance of this vaccine. Advised may receive this vaccine at local pharmacy or Health Dept. Aware to provide a copy of the vaccination record if obtained from local pharmacy or Health Dept. Verbalized acceptance and understanding.  Flu Vaccine status: Declined, Education has been provided regarding the importance of this vaccine but patient still declined. Advised may receive this vaccine at local pharmacy or Health Dept. Aware to provide a copy of the vaccination record if obtained from local pharmacy or Health Dept. Verbalized acceptance and understanding.  Pneumococcal vaccine status: Up to date  Covid-19 vaccine status: Declined, Education has been provided regarding the importance of this vaccine but patient still declined. Advised may receive this vaccine at local pharmacy or Health Dept.or vaccine clinic. Aware to provide a copy of the vaccination record if obtained from local pharmacy or Health Dept. Verbalized acceptance and understanding.  Qualifies for Shingles Vaccine? Yes   Zostavax completed Yes    Shingrix Completed?: No.    Education has been provided regarding the importance of this vaccine. Patient has been advised to call insurance company to determine out of pocket expense if they have not yet received this vaccine. Advised may also receive vaccine at local pharmacy or Health Dept. Verbalized acceptance and understanding.  Screening Tests Health Maintenance  Topic Date Due   COVID-19 Vaccine (1) Never done   Diabetic kidney evaluation - Urine ACR  Never done   Zoster Vaccines- Shingrix (1 of 2) Never done   TETANUS/TDAP  03/01/2023 (Originally 12/01/2020)   INFLUENZA VACCINE  07/01/2022   Diabetic kidney evaluation - GFR measurement  03/01/2023   COLONOSCOPY (Pts 45-27yr Insurance coverage will need to be confirmed)  09/11/2023   Pneumonia Vaccine 76 Years old  Completed   DEXA SCAN  Completed   Hepatitis C Screening  Completed   HPV VACCINES  Aged Out    Health Maintenance  Health Maintenance Due  Topic Date Due   COVID-19 Vaccine (1) Never done   Diabetic kidney evaluation - Urine ACR  Never done   Zoster Vaccines- Shingrix (1 of 2) Never done    Colorectal cancer screening: Type of screening: Colonoscopy. Completed 09/10/2020. Repeat every 3 years  Mammogram status: Ordered 06/23/2022. Pt provided with contact info and advised to call to schedule appt.   Bone Density status: Ordered 06/23/2022. Pt provided with contact info and advised to call to schedule appt.  Lung Cancer Screening: (Low Dose CT Chest recommended if Age 78-80years, 30 pack-year currently smoking OR have quit w/in 15years.) does not qualify.   Lung Cancer Screening Referral: no  Additional Screening:  Hepatitis C Screening: does qualify; Completed 06/15/2016  Vision Screening: Recommended annual ophthalmology exams for early detection of glaucoma and other disorders of the eye. Is the patient up to date with their annual eye exam?  Yes  Who is the provider or what is the name of the office  in which the patient attends annual eye exams? CWarden Fillers MD. If pt is not established with a provider, would they like to be referred to a provider to establish care? No .   Dental Screening: Recommended annual dental exams for proper oral hygiene  Community Resource Referral / Chronic Care Management: CRR required this visit?  No   CCM required this visit?  No      Plan:     I have personally reviewed and noted  the following in the patient's chart:   Medical and social history Use of alcohol, tobacco or illicit drugs  Current medications and supplements including opioid prescriptions.  Functional ability and status Nutritional status Physical activity Advanced directives List of other physicians Hospitalizations, surgeries, and ER visits in previous 12 months Vitals Screenings to include cognitive, depression, and falls Referrals and appointments  In addition, I have reviewed and discussed with patient certain preventive protocols, quality metrics, and best practice recommendations. A written personalized care plan for preventive services as well as general preventive health recommendations were provided to patient.     Sheral Flow, LPN   [id number]   Nurse Notes:  Patient is cogitatively intact. There were no vitals filed for this visit. There is no height or weight on file to calculate BMI. Patient stated that she has no issues with gait or balance; does not use any assistive devices. Medications reviewed with patient; no opioid use noted.

## 2022-06-29 ENCOUNTER — Encounter: Payer: Self-pay | Admitting: Internal Medicine

## 2022-07-04 ENCOUNTER — Ambulatory Visit: Payer: Medicare HMO

## 2022-07-18 ENCOUNTER — Ambulatory Visit
Admission: RE | Admit: 2022-07-18 | Discharge: 2022-07-18 | Disposition: A | Discharge status: Home / Self Care | Payer: Medicare HMO | Source: Ambulatory Visit | Attending: Internal Medicine | Admitting: Internal Medicine

## 2022-07-18 DIAGNOSIS — Z13820 Encounter for screening for osteoporosis: Secondary | ICD-10-CM

## 2022-07-18 DIAGNOSIS — Z1231 Encounter for screening mammogram for malignant neoplasm of breast: Secondary | ICD-10-CM | POA: Diagnosis not present

## 2022-07-18 DIAGNOSIS — M810 Age-related osteoporosis without current pathological fracture: Secondary | ICD-10-CM | POA: Diagnosis not present

## 2022-07-18 DIAGNOSIS — Z1239 Encounter for other screening for malignant neoplasm of breast: Secondary | ICD-10-CM

## 2022-07-18 DIAGNOSIS — Z780 Asymptomatic menopausal state: Secondary | ICD-10-CM | POA: Diagnosis not present

## 2022-08-10 ENCOUNTER — Encounter: Payer: Self-pay | Admitting: Internal Medicine

## 2022-08-13 MED ORDER — FLUCONAZOLE 150 MG PO TABS: 150.0000 mg | ORAL_TABLET | Freq: Every day | ORAL | 0 refills | Status: AC

## 2022-08-21 DIAGNOSIS — L309 Dermatitis, unspecified: Secondary | ICD-10-CM | POA: Diagnosis not present

## 2022-08-21 DIAGNOSIS — L818 Other specified disorders of pigmentation: Secondary | ICD-10-CM | POA: Diagnosis not present

## 2022-09-09 ENCOUNTER — Encounter: Payer: Self-pay | Admitting: Internal Medicine

## 2022-09-09 DIAGNOSIS — B370 Candidal stomatitis: Secondary | ICD-10-CM

## 2022-09-17 ENCOUNTER — Other Ambulatory Visit: Payer: Self-pay

## 2022-09-17 ENCOUNTER — Ambulatory Visit: Payer: Medicare HMO | Admitting: Internal Medicine

## 2022-09-17 ENCOUNTER — Encounter: Payer: Self-pay | Admitting: Internal Medicine

## 2022-09-17 VITALS — BP 114/75 | HR 79 | Temp 97.6°F | Ht 64.0 in | Wt 129.0 lb

## 2022-09-17 DIAGNOSIS — R21 Rash and other nonspecific skin eruption: Secondary | ICD-10-CM

## 2022-09-17 NOTE — Progress Notes (Signed)
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   Chest pain 02/28/2022   Other fatigue 02/28/2022   Hx gestational diabetes 02/28/2022   Cervical radiculopathy 08/20/2021   Depression with anxiety 08/20/2021   Neck pain 05/31/2021   Rash 05/31/2021   Pain in both lower extremities 06/06/2020   Dysesthesia 06/06/2020   Abnormal cardiac CT angiography 04/17/2020   Chronic back pain 06/01/2019   Atypical facial pain 10/06/2018   Facial numbness 10/06/2018   History of optic neuritis 10/06/2018   Piriformis muscle pain 10/06/2018   Pure hypercholesterolemia 05/03/2018   Statin intolerance 05/03/2018   Routine general medical examination at a health care facility 06/05/2016   Acute recurrent pansinusitis 01/18/2016   Cough, persistent 01/18/2016   Gastroesophageal reflux disease without esophagitis 01/18/2016   Upper airway cough syndrome 11/01/2015   Gait disturbance 06/07/2015   Allergic rhinitis 05/31/2015   Dyslipidemia, goal LDL below 70 10/24/2014   COPD (chronic obstructive pulmonary disease) ([place]) 10/09/2014   Coronary artery disease 08/01/2009      HPI: BRAELEE HERRLE is a 76 y.o. female copd on inhaled steroid, post nasal drip with chronic cough, referred by pcp for possible thrush  She accompanied her husband today who is referred by same pcp for same problem  Other of these patients' sx started July 2023. They noticed their tongue developed a white coating. The husband has some tingling with it as well. No pain with swallowing otherwise or other sx  They chronically both have post nasal drip. The patient do have dry cough associated with it  They have been on inhaled steroid for a long time but never wash after spray  She didn't take any medication. She stopped inhaled  steroid in July after her husband saw pcp and was treated for yeast. She thinks she has the same thing; she emailed her pcp and was referred to me with her husband  She still has some whiteness in her tongue; she said it wasn't as bad as her husband  She has mild sorethroat with the chronic cough  No f/c No b sx  Review of Systems: ROS All other ros negative      Past Medical History:  Diagnosis Date   AIN grade I    COPD (chronic obstructive pulmonary disease) ([place])    Coronary artery disease 08/2009   40% mid left circ by cath in [place], [place]   Dyspnea    with heavy exertion   Endometriosis    1986   Endometriosis yrs ago   Family history of adverse reaction to anesthesia    sister ponv   GERD (gastroesophageal reflux disease)    Granuloma annulare    Gum lesion    per pt left jawborn has small knot on gum on back tooth, is not painful or draining   Headache    History of gestational diabetes many  yrs ago   History of hiatal hernia    slight hh   History of kidney stones Feb 28, 2011   passed on own   History of migraine yrs ago   Hyperlipidemia    Optic neuritis    02/27/14   Optic neuritis Novembr   Trigeminal neuralgia    Urinary urgency    Vaginal anomaly    tears and bleeds easily  Social History   Tobacco Use   Smoking status: Former    Packs/day: 2.00    Years: 40.00    Total pack years: 80.00    Types: Cigarettes    Quit date: 02/05/1996    Years since quitting: 26.6   Smokeless tobacco: Never   Tobacco comments:    Educated LDCT for lung Ca to discuss with MD if interested  Vaping Use   Vaping Use: Never used  Substance Use Topics   Alcohol use: Yes    Alcohol/week: 3.0 standard drinks of alcohol    Types: 3 Glasses of wine per week    Comment: wine with dinner occasionally   Drug use: No    Family History  Problem Relation Age of Onset   Cancer Mother        Breast    Breast cancer Mother    Stroke Brother        open heart surgery     Colon cancer Neg Hx    Esophageal cancer Neg Hx    Rectal cancer Neg Hx    Stomach cancer Neg Hx     Allergies  Allergen Reactions   Bevespi Aerosphere [Glycopyrrolate-Formoterol] Other (See Comments)    Patient felt like her throat was closing up   Atorvastatin Other (See Comments)    myalgia   Crestor [Rosuvastatin] Other (See Comments)    Leg cramps   Duloxetine Diarrhea   Lamotrigine Rash   Latex Rash   Statins Other (See Comments)    Leg cramps    OBJECTIVE: Vitals:   09/17/22 0907  BP: 114/75  Pulse: 79  Temp: 97.6 F (36.4 C)  TempSrc: Oral  SpO2: 96%  Weight: 129 lb (58.5 kg)  Height: '5\' 4"'$  (1.626 m)   Body mass index is 22.14 kg/m.   Physical Exam General/constitutional: no distress, pleasant HEENT: Normocephalic, PER, Conj Clear, EOMI, Oropharynx clear Neck supple CV: rrr no mrg Lungs: clear to auscultation, normal respiratory effort Abd: Soft, Nontender Ext: no edema Skin: No Rash Neuro: nonfocal MSK: no peripheral joint swelling/tenderness/warmth; back spines nontender   Lab:  Microbiology:  Serology:  Imaging:   Assessment/plan: Problem List Items Addressed This Visit       Musculoskeletal and Integument   Rash - Primary    Patient accompanies her husband today. Both were treated for presumed oral candidiasis at the same time in July and again about 4 weeks prior to this 09/17/2022 visit. She is assymptomatic from oral candidiasis standpoint.  Exam finding today showed no white plaque on tongue or erythema or swollen tongue  She never took medication for it but did stop her steroid inhaler without change   She has no other risk really for yeast otherwise  My hypothesis is that given the same onset and they both have chronic rhinitis post nasal gtt I query bacterial over growth. The fact that her husband didn't respond to a proper course of 10 days diflucan also suggest as much   -advise to wash mouth carefully after using  steroid inhaler -avoid treating for oral candiasis until revisited with me if she think it is there -f/u as needed  I have spent a total of 65 minutes of face-to-face and non-face-to-face time, excluding clinical staff time, preparing to see patient, ordering tests and/or medications, and provide counseling the patient      Follow-up: Return if symptoms worsen or fail to improve.  Jabier Mutton, [place] [facility] Infectious Disease [facility] 09/17/2022, 9:25 AM

## 2022-09-17 NOTE — Patient Instructions (Signed)
I suspect you had bacterial overgrowth from either URI or post nasal drip  While you have risk factor for oral yeast with your steroid inhaler, it doesn't appear this is the case of the yeast infection. However, I agree you'll need to clear up the inhaler by gargling with water well after each spray  Follow up with me if you are concerned about yeast in mouth

## 2022-09-23 ENCOUNTER — Ambulatory Visit: Payer: Medicare HMO | Admitting: Pulmonary Disease

## 2022-09-29 ENCOUNTER — Telehealth: Payer: Self-pay | Admitting: Primary Care

## 2022-09-29 ENCOUNTER — Encounter: Payer: Self-pay | Admitting: Primary Care

## 2022-09-29 ENCOUNTER — Ambulatory Visit: Payer: Medicare HMO | Admitting: Primary Care

## 2022-09-29 VITALS — BP 120/62 | HR 86 | Temp 98.1°F | Ht 64.0 in | Wt 128.8 lb

## 2022-09-29 DIAGNOSIS — J3089 Other allergic rhinitis: Secondary | ICD-10-CM

## 2022-09-29 DIAGNOSIS — J449 Chronic obstructive pulmonary disease, unspecified: Secondary | ICD-10-CM

## 2022-09-29 MED ORDER — IPRATROPIUM BROMIDE 0.03 % NA SOLN: 2.0000 | Freq: Two times a day (BID) | NASAL | 1 refills | Status: DC

## 2022-09-29 NOTE — Progress Notes (Signed)
$'@Patient'C$  ID: Wendy Patrick, female    DOB: 23-Dec-1945, 76 y.o.   MRN: [id number]  Chief Complaint  Patient presents with   Follow-up    Referring provider: Hoyt Koch, *  HPI: 76 year old female, former smoker (80-pack-year hx).  Past medical history significant for chronic obstructive pulmonary disease, emphysema, subcarinal mass, chronic rhinitis, PND, chronic cough.  Patient of Dr. Erin Fulling.   09/29/2022 Patient presents today for follow-up/COPD. She has a chronic cough and dyspnea. Pulmonary function testing showed mild obstruction. She stopped Breztri d/t throat irritation. She was not using ICS inhaler for awhile d/t potential thrush. She saw ID in early October for evaluation oral candidiasis and symptoms were not felt to be d/t thrush. She has since restarted Anoro but states that the inhaler is too expensive. She has 2 month supply of medication. CT chest in May 2023 showed severe upper lobe predominant emphysematous, subpleural scarring at lung bases.   Allergies  Allergen Reactions   Bevespi Aerosphere [Glycopyrrolate-Formoterol] Other (See Comments)    Patient felt like her throat was closing up   Atorvastatin Other (See Comments)    myalgia   Crestor [Rosuvastatin] Other (See Comments)    Leg cramps   Duloxetine Diarrhea   Lamotrigine Rash   Latex Rash   Statins Other (See Comments)    Leg cramps    Immunization History  Administered Date(s) Administered   Pneumococcal Conjugate-13 06/05/2016   Pneumococcal Polysaccharide-23 12/01/2009, 08/02/2019, 08/22/2019   Tdap 12/01/2010   Zoster, Live 12/02/2011    Past Medical History:  Diagnosis Date   AIN grade I    COPD (chronic obstructive pulmonary disease) ([place])    Coronary artery disease 08/2009   40% mid left circ by cath in [place], [place]   Dyspnea    with heavy exertion   Endometriosis    1986   Endometriosis yrs ago   Family history of adverse reaction to anesthesia    sister ponv   GERD  (gastroesophageal reflux disease)    Granuloma annulare    Gum lesion    per pt left jawborn has small knot on gum on back tooth, is not painful or draining   Headache    History of gestational diabetes many  yrs ago   History of hiatal hernia    slight hh   History of kidney stones 2011/03/07   passed on own   History of migraine yrs ago   Hyperlipidemia    Optic neuritis    2014-03-06   Optic neuritis Novembr   Trigeminal neuralgia    Urinary urgency    Vaginal anomaly    tears and bleeds easily    Tobacco History: Social History   Tobacco Use  Smoking Status Former   Packs/day: 2.00   Years: 40.00   Total pack years: 80.00   Types: Cigarettes   Quit date: 02/05/1996   Years since quitting: 26.6  Smokeless Tobacco Never  Tobacco Comments   Educated LDCT for lung Ca to discuss with MD if interested   Counseling given: Not Answered Tobacco comments: Educated LDCT for lung Ca to discuss with MD if interested   Outpatient Medications Prior to Visit  Medication Sig Dispense Refill   Alirocumab (PRALUENT) 75 MG/ML SOAJ Inject 75 mg into the skin every 14 (fourteen) days. 2 mL 11   Ascorbic Acid (VITAMIN C) 1000 MG tablet Take 1,000 mg by mouth daily.     cholecalciferol (VITAMIN D) 1000 units tablet Take 10,000 Units by  mouth daily. Vitamin d 3     diphenhydrAMINE (BENADRYL) 25 MG tablet Take 25 mg by mouth at bedtime.     KRILL OIL PO Take 250 mg by mouth daily.     Loratadine 10 MG CAPS Take 10 mg by mouth daily.     Multiple Vitamins-Minerals (CENTRUM SILVER PO) Take by mouth daily at 2 PM.     Tiotropium Bromide-Olodaterol (STIOLTO RESPIMAT) 2.5-2.5 MCG/ACT AERS Inhale 2 puffs into the lungs daily. (Patient not taking: Reported on 09/17/2022) 4 g 5   Tiotropium Bromide-Olodaterol (STIOLTO RESPIMAT) 2.5-2.5 MCG/ACT AERS Inhale 2 puffs into the lungs daily. (Patient not taking: Reported on 09/17/2022) 4 g 0   No facility-administered medications prior to visit.   Review of  Systems  Review of Systems  Constitutional: Negative.   HENT: Negative.    Respiratory:  Positive for cough and shortness of breath. Negative for chest tightness and wheezing.     Physical Exam  BP 120/62 (BP Location: Right Arm, Patient Position: Sitting, Cuff Size: Normal)   Pulse 86   Temp 98.1 F (36.7 C) (Oral)   Ht '5\' 4"'$  (1.626 m)   Wt 128 lb 12.8 oz (58.4 kg)   SpO2 97%   BMI 22.11 kg/m  Physical Exam Constitutional:      Appearance: Normal appearance.  HENT:     Head: Normocephalic and atraumatic.     Mouth/Throat:     Mouth: Mucous membranes are moist.     Pharynx: Oropharynx is clear.  Cardiovascular:     Rate and Rhythm: Normal rate and regular rhythm.  Pulmonary:     Effort: Pulmonary effort is normal. No respiratory distress.     Comments: Fine crackles at lug bases  Musculoskeletal:        General: Normal range of motion.     Cervical back: Normal range of motion and neck supple.  Skin:    General: Skin is warm and dry.  Neurological:     General: No focal deficit present.     Mental Status: She is alert and oriented to person, place, and time. Mental status is at baseline.  Psychiatric:        Mood and Affect: Mood normal.        Behavior: Behavior normal.        Thought Content: Thought content normal.        Judgment: Judgment normal.      Lab Results:  CBC    Component Value Date/Time   WBC 5.9 02/28/2022 0855   RBC 5.13 (H) 02/28/2022 0855   HGB 14.5 02/28/2022 0855   HGB 14.3 04/13/2020 1047   HCT 43.8 02/28/2022 0855   HCT 44.5 04/13/2020 1047   PLT 295.0 02/28/2022 0855   PLT 375 04/13/2020 1047   MCV 85.4 02/28/2022 0855   MCV 84 04/13/2020 1047   MCH 26.9 04/13/2020 1047   MCH 28.3 10/10/2014 0834   MCHC 33.1 02/28/2022 0855   RDW 14.4 02/28/2022 0855   RDW 12.7 04/13/2020 1047   LYMPHSABS 1.6 03/08/2018 0932   MONOABS 0.7 03/08/2018 0932   EOSABS 0.2 03/08/2018 0932   BASOSABS 0.0 03/08/2018 0932    BMET    Component  Value Date/Time   NA 141 02/28/2022 0855   NA 141 04/13/2020 1047   K 4.2 02/28/2022 0855   CL 104 02/28/2022 0855   CO2 29 02/28/2022 0855   GLUCOSE 96 02/28/2022 0855   BUN 15 02/28/2022 0855   BUN 15  04/13/2020 1047   CREATININE 0.99 02/28/2022 0855   CALCIUM 9.5 02/28/2022 0855   GFRNONAA 57 (L) 04/13/2020 1047   GFRAA 65 04/13/2020 1047    BNP No results found for: "BNP"  ProBNP No results found for: "PROBNP"  Imaging: No results found.   Assessment & Plan:   COPD (chronic obstructive pulmonary disease) - Patient has chronic cough. CT imaging in May 2023 showed sever upper lobe predominant emphysema. She has mild obstruction with moderate-severe diffusion defect on pulmonary function testing. Some confusion over her inhalers. She was on Breztri but this caused throat irritation. She was evaluated by ID and not felt to have oral thrush. Stiolto was too expensive. Advised she resume Anoro. She is currently in coverage gap, she has 2 month supply LABA/LAMA. We can supplement ANORO inhaler as needed until end of the year.   Allergic rhinitis - Continue Flonase, resume Atrovent nasal spray      Martyn Ehrich, NP 10/05/2022

## 2022-09-29 NOTE — Patient Instructions (Addendum)
Recommendations: Continue Anoro 1 puff daily in the morning until you finish what you have (call when you have about 1 week left) We will look into cheaper alternative  Resume ipratropium (atrovent) nasal spray - prescription sent  Follow-up: 6 months with Dr. Erin Fulling

## 2022-09-29 NOTE — Telephone Encounter (Signed)
What is the preferred LABA/LAMA for patient on their insurance plan? Anoro is too expensive

## 2022-09-30 ENCOUNTER — Other Ambulatory Visit (HOSPITAL_COMMUNITY): Payer: Self-pay

## 2022-09-30 NOTE — Telephone Encounter (Signed)
Ok we can supplement patient with samples until she is out. She has 2 months worth of medication, should be ok

## 2022-09-30 NOTE — Telephone Encounter (Signed)
Per benefits investigation, alternative LABA/LAMA are not covered. It seems that the patient is in the Coverage Gap that may be contributing to the cost of the Anoro.

## 2022-10-05 NOTE — Assessment & Plan Note (Addendum)
-   Patient has chronic cough. CT imaging in May 2023 showed sever upper lobe predominant emphysema. She has mild obstruction with moderate-severe diffusion defect on pulmonary function testing. Some confusion over her inhalers. She was on Breztri but this caused throat irritation. She was evaluated by ID and not felt to have oral thrush. Stiolto was too expensive. Advised she resume Anoro. She is currently in coverage gap, she has 2 month supply LABA/LAMA. We can supplement ANORO inhaler as needed until end of the year.

## 2022-10-05 NOTE — Assessment & Plan Note (Addendum)
-   Continue Flonase, resume Atrovent nasal spray

## 2022-11-10 ENCOUNTER — Other Ambulatory Visit (HOSPITAL_COMMUNITY): Payer: Self-pay

## 2022-11-10 ENCOUNTER — Telehealth: Payer: Self-pay | Admitting: Primary Care

## 2022-11-10 DIAGNOSIS — J449 Chronic obstructive pulmonary disease, unspecified: Secondary | ICD-10-CM

## 2022-11-10 MED ORDER — IPRATROPIUM-ALBUTEROL 0.5-2.5 (3) MG/3ML IN SOLN: 3.0000 mL | Freq: Three times a day (TID) | RESPIRATORY_TRACT | 11 refills | Status: DC

## 2022-11-10 NOTE — Telephone Encounter (Signed)
Called and spoke with pt letting her know the info per BW and she verbalized understanding. Rx for neb solution sent to pharmacy and order for nebulizer placed. Nothing further needed.

## 2022-11-10 NOTE — Telephone Encounter (Signed)
Per previous encounter for Anoro benefits determination.

## 2022-11-10 NOTE — Telephone Encounter (Signed)
Called and spoke with pt who states the Anoro inhaler is too expensive and she is requesting to switch to a different inhaler that is more affordable for her.  Routing this to both [place] and prior auth team for review.

## 2022-11-10 NOTE — Telephone Encounter (Signed)
No samples of Anoro. Spoke with pt who states she is currently has 13 days left on her current Anoro inhaler.  Asked pt if she was currently in the coverage gap and she said she was not but her husband was. Is there anything that might be able to be prescribed for pt to hold her over?

## 2022-11-10 NOTE — Telephone Encounter (Signed)
She will need to cover cost of Anoro. Alternatives are not covered. Per pharamcy she was in coverage gap. We can have her use ipratropium-albuterol nebulizer TID scheduled.

## 2022-11-12 ENCOUNTER — Telehealth: Payer: Self-pay | Admitting: Primary Care

## 2022-11-12 NOTE — Telephone Encounter (Signed)
Called and took care of this order with Mardene Celeste with [employer]. Nothing further needed

## 2022-11-12 NOTE — Telephone Encounter (Signed)
[employer] called, Mardene Celeste. Still waiting for signed RX for neb by Dr. (See signed encounter). TY

## 2022-11-14 DIAGNOSIS — J449 Chronic obstructive pulmonary disease, unspecified: Secondary | ICD-10-CM | POA: Diagnosis not present

## 2022-12-04 ENCOUNTER — Telehealth: Payer: Self-pay | Admitting: Primary Care

## 2022-12-04 NOTE — Telephone Encounter (Signed)
Needs OV first available, if nothing open ok to double book with me at 11:30 or 1:30

## 2022-12-04 NOTE — Telephone Encounter (Signed)
Called patient this evening and she states that she is having fits of coughing and it is making her have trouble catching her breath at times given the coughing fits she has  Symptoms: Coughing Trouble breathing Very little mucus production Color is bloody and almost green at times   Started: Was on Anoro and it helped the cough but she can not afford the inhaler. And then was placed on nebs and it did help for a little while.  OTC: Takes Benadryl at night to help her sleep given the coughing.   Please advise [employer]

## 2022-12-04 NOTE — Telephone Encounter (Signed)
Called patient back and got her scheduled to come in tomorrow to see Beth at 11am. Nothing further needed

## 2022-12-05 ENCOUNTER — Ambulatory Visit: Payer: Medicare HMO | Admitting: Primary Care

## 2022-12-05 ENCOUNTER — Ambulatory Visit (INDEPENDENT_AMBULATORY_CARE_PROVIDER_SITE_OTHER): Payer: Medicare HMO

## 2022-12-05 ENCOUNTER — Encounter: Payer: Self-pay | Admitting: Primary Care

## 2022-12-05 VITALS — BP 124/66 | HR 77 | Ht 64.0 in | Wt 135.0 lb

## 2022-12-05 DIAGNOSIS — J449 Chronic obstructive pulmonary disease, unspecified: Secondary | ICD-10-CM

## 2022-12-05 DIAGNOSIS — J209 Acute bronchitis, unspecified: Secondary | ICD-10-CM | POA: Diagnosis not present

## 2022-12-05 MED ORDER — PREDNISONE 10 MG PO TABS: ORAL_TABLET | ORAL | 0 refills | Status: DC

## 2022-12-05 MED ORDER — CHLORPHENIRAMINE MALEATE 4 MG PO TABS: 4.0000 mg | ORAL_TABLET | Freq: Two times a day (BID) | ORAL | 0 refills | Status: DC | PRN

## 2022-12-05 NOTE — Progress Notes (Signed)
$'@Patient'V$  ID: Wendy Patrick, female    DOB: 09/06/1946, 77 y.o.   MRN: [id number]  No chief complaint on file.   Referring provider: Hoyt Koch, *  HPI: 77 year old female, former smoker (80-pack-year hx).  Past medical history significant for chronic obstructive pulmonary disease, emphysema, subcarinal mass, chronic rhinitis, PND, chronic cough.  Patient of Dr. Erin Fulling.   Previous [facility] pulmonary encounter: 09/29/2022 Patient presents today for follow-up/COPD. She has a chronic cough and dyspnea. Pulmonary function testing showed mild obstruction. She stopped Breztri d/t throat irritation. She was not using ICS inhaler for awhile d/t potential thrush. She saw ID in early October for evaluation oral candidiasis and symptoms were not felt to be d/t thrush. She has since restarted Anoro but states that the inhaler is too expensive. She has 2 month supply of medication. CT chest in May 2023 showed severe upper lobe predominant emphysematous, subpleural scarring at lung bases.  12/05/2022 Patient presents today for acute OV She has been having coughing fits last several months, worse after using nebulizer She has clearing her throat  Trouble catching her breath at times d.t cough Scant mucus production. She had one occurrence where she coughed up a spec of blood  She was on Anoro which helped cough but can not afford inhaler CT chest in May 2023 showed emphysema and scarring   Allergies  Allergen Reactions   Bevespi Aerosphere [Glycopyrrolate-Formoterol] Other (See Comments)    Patient felt like her throat was closing up   Atorvastatin Other (See Comments)    myalgia   Crestor [Rosuvastatin] Other (See Comments)    Leg cramps   Duloxetine Diarrhea   Lamotrigine Rash   Latex Rash   Statins Other (See Comments)    Leg cramps    Immunization History  Administered Date(s) Administered   Pneumococcal Conjugate-13 06/05/2016   Pneumococcal Polysaccharide-23 12/01/2009,  08/02/2019, 08/22/2019   Tdap 12/01/2010   Zoster, Live 12/02/2011    Past Medical History:  Diagnosis Date   AIN grade I    COPD (chronic obstructive pulmonary disease) ([place])    Coronary artery disease 08/2009   40% mid left circ by cath in [place], [place]   Dyspnea    with heavy exertion   Endometriosis    1986   Endometriosis yrs ago   Family history of adverse reaction to anesthesia    sister ponv   GERD (gastroesophageal reflux disease)    Granuloma annulare    Gum lesion    per pt left jawborn has small knot on gum on back tooth, is not painful or draining   Headache    History of gestational diabetes many  yrs ago   History of hiatal hernia    slight hh   History of kidney stones Feb 27, 2011   passed on own   History of migraine yrs ago   Hyperlipidemia    Optic neuritis    02/26/2014   Optic neuritis Novembr   Trigeminal neuralgia    Urinary urgency    Vaginal anomaly    tears and bleeds easily    Tobacco History: Social History   Tobacco Use  Smoking Status Former   Packs/day: 2.00   Years: 40.00   Total pack years: 80.00   Types: Cigarettes   Quit date: 02/05/1996   Years since quitting: 26.8  Smokeless Tobacco Never  Tobacco Comments   Educated LDCT for lung Ca to discuss with MD if interested   Counseling given: Not Answered Tobacco comments: Educated  LDCT for lung Ca to discuss with MD if interested   Outpatient Medications Prior to Visit  Medication Sig Dispense Refill   Alirocumab (PRALUENT) 75 MG/ML SOAJ Inject 75 mg into the skin every 14 (fourteen) days. 2 mL 11   Ascorbic Acid (VITAMIN C) 1000 MG tablet Take 1,000 mg by mouth daily.     cholecalciferol (VITAMIN D) 1000 units tablet Take 10,000 Units by mouth daily. Vitamin d 3     diphenhydrAMINE (BENADRYL) 25 MG tablet Take 25 mg by mouth at bedtime.     ipratropium (ATROVENT) 0.03 % nasal spray Place 2 sprays into both nostrils every 12 (twelve) hours. 30 mL 1   ipratropium-albuterol (DUONEB)  0.5-2.5 (3) MG/3ML SOLN Take 3 mLs by nebulization 3 (three) times daily. 270 mL 11   KRILL OIL PO Take 250 mg by mouth daily.     Loratadine 10 MG CAPS Take 10 mg by mouth daily.     Multiple Vitamins-Minerals (CENTRUM SILVER PO) Take by mouth daily at 2 PM.     No facility-administered medications prior to visit.   Review of Systems  Review of Systems  Constitutional: Negative.   Respiratory:  Positive for cough. Negative for shortness of breath and wheezing.    Physical Exam  There were no vitals taken for this visit. Physical Exam Constitutional:      Appearance: Normal appearance.  HENT:     Head: Normocephalic and atraumatic.  Cardiovascular:     Rate and Rhythm: Normal rate and regular rhythm.  Pulmonary:     Effort: Pulmonary effort is normal.     Breath sounds: Normal breath sounds.  Skin:    General: Skin is warm and dry.  Neurological:     General: No focal deficit present.     Mental Status: She is alert and oriented to person, place, and time. Mental status is at baseline.  Psychiatric:        Mood and Affect: Mood normal.        Behavior: Behavior normal.        Thought Content: Thought content normal.        Judgment: Judgment normal.      Lab Results:  CBC    Component Value Date/Time   WBC 5.9 02/28/2022 0855   RBC 5.13 (H) 02/28/2022 0855   HGB 14.5 02/28/2022 0855   HGB 14.3 04/13/2020 1047   HCT 43.8 02/28/2022 0855   HCT 44.5 04/13/2020 1047   PLT 295.0 02/28/2022 0855   PLT 375 04/13/2020 1047   MCV 85.4 02/28/2022 0855   MCV 84 04/13/2020 1047   MCH 26.9 04/13/2020 1047   MCH 28.3 10/10/2014 0834   MCHC 33.1 02/28/2022 0855   RDW 14.4 02/28/2022 0855   RDW 12.7 04/13/2020 1047   LYMPHSABS 1.6 03/08/2018 0932   MONOABS 0.7 03/08/2018 0932   EOSABS 0.2 03/08/2018 0932   BASOSABS 0.0 03/08/2018 0932    BMET    Component Value Date/Time   NA 141 02/28/2022 0855   NA 141 04/13/2020 1047   K 4.2 02/28/2022 0855   CL 104  02/28/2022 0855   CO2 29 02/28/2022 0855   GLUCOSE 96 02/28/2022 0855   BUN 15 02/28/2022 0855   BUN 15 04/13/2020 1047   CREATININE 0.99 02/28/2022 0855   CALCIUM 9.5 02/28/2022 0855   GFRNONAA 57 (L) 04/13/2020 1047   GFRAA 65 04/13/2020 1047    BNP No results found for: "BNP"  ProBNP No results found for: "  PROBNP"  Imaging: No results found.   Assessment & Plan:   No problem-specific Assessment & Plan notes found for this encounter.    Recommendations: Start Stiolto Respimat- take 2 puffs daily in the morning  Continue Atrovent nasal spray Start chlorpheniramine '4mg'$  tablet every 12 hours as needed for cough Prednisone '20mg'$  x 5 days   Orders: CXR today   Follow-up: Call if symptoms do not improve in 1-2 weeks   Martyn Ehrich, NP 12/05/2022

## 2022-12-05 NOTE — Patient Instructions (Addendum)
Recommendations: Start Stiolto Respimat- take 2 puffs daily in the morning  Continue Atrovent nasal spray Start chlorpheniramine '4mg'$  tablet every 12 hours as needed for cough Prednisone '20mg'$  x 5 days   Orders: CXR today   Follow-up: Call if symptoms do not improve in 1-2 weeks

## 2022-12-05 NOTE — Progress Notes (Signed)
Please let patient know CXR was normal

## 2022-12-19 ENCOUNTER — Other Ambulatory Visit (HOSPITAL_COMMUNITY): Payer: Self-pay

## 2022-12-23 ENCOUNTER — Telehealth: Payer: Self-pay

## 2022-12-23 DIAGNOSIS — I25119 Atherosclerotic heart disease of native coronary artery with unspecified angina pectoris: Secondary | ICD-10-CM

## 2022-12-23 DIAGNOSIS — E785 Hyperlipidemia, unspecified: Secondary | ICD-10-CM

## 2022-12-23 NOTE — Telephone Encounter (Signed)
Pharmacy Patient Advocate Encounter  Prior Authorization for Repatha '140MG'$ /ML has been approved.    KEY# [id number] Effective dates: 1.1.24 through 12.31.24   Please note that Rx Praluent is no longer a covered medication on the patients formulary. A current rx for Repatha will need to be sent into the patients pharmacy

## 2023-01-02 MED ORDER — REPATHA SURECLICK 140 MG/ML ~~LOC~~ SOAJ: 1.0000 mL | SUBCUTANEOUS | 3 refills | Status: DC

## 2023-01-26 ENCOUNTER — Encounter: Payer: Self-pay | Admitting: Family Medicine

## 2023-01-26 ENCOUNTER — Ambulatory Visit (INDEPENDENT_AMBULATORY_CARE_PROVIDER_SITE_OTHER): Payer: Medicare HMO | Admitting: Family Medicine

## 2023-01-26 VITALS — BP 110/62 | HR 68 | Temp 97.5°F | Resp 20 | Ht 64.0 in | Wt 133.0 lb

## 2023-01-26 DIAGNOSIS — J029 Acute pharyngitis, unspecified: Secondary | ICD-10-CM

## 2023-01-26 DIAGNOSIS — J3089 Other allergic rhinitis: Secondary | ICD-10-CM | POA: Diagnosis not present

## 2023-01-26 LAB — POCT RAPID STREP A (OFFICE): Rapid Strep A Screen: NEGATIVE

## 2023-01-26 MED ORDER — FLUTICASONE PROPIONATE 50 MCG/ACT NA SUSP: 2.0000 | Freq: Every day | NASAL | 0 refills | Status: AC

## 2023-01-26 MED ORDER — LEVOCETIRIZINE DIHYDROCHLORIDE 5 MG PO TABS: 5.0000 mg | ORAL_TABLET | Freq: Every evening | ORAL | 0 refills | Status: DC

## 2023-01-26 NOTE — Progress Notes (Signed)
Assessment & Plan:  1. Allergic rhinitis due to dust Education provided on sore throat.  Encouraged throat lozenges, Chloraseptic spray, and Tylenol for pain relief.  Advised to stop taking loratadine and start Xyzal, as well as stop Atrovent and start Flonase.  Continue antibiotics for dental infection. - levocetirizine (XYZAL) 5 MG tablet; Take 1 tablet (5 mg total) by mouth every evening.  Dispense: 30 tablet; Refill: 0 - fluticasone (FLONASE) 50 MCG/ACT nasal spray; Place 2 sprays into both nostrils daily.  Dispense: 16 g; Refill: 0  2. Sore throat - POCT rapid strep A - Culture, Group A Strep  Results for orders placed or performed in visit on 01/26/23  POCT rapid strep A  Result Value Ref Range   Rapid Strep A Screen Negative Negative    Follow up plan: Return if symptoms worsen or fail to improve.  Hendricks Limes, MSN, APRN, FNP-C  Subjective:  HPI: Wendy Patrick is a 77 y.o. female presenting on 01/26/2023 for Sore Throat (X 2 weeks - this started 2 weeks ago with a dry cough. Her apartment is being remodeled and she has been exposed to cold air and a lot of dust recently //She also has a dental infection and is currently on Amox 500 )  Patient is accompanied by her husband, who she is okay with being present. She complains of cough, sneezing, sore throat, and ear pain/pressure. She denies head/chest congestion, fever, postnasal drainage, shortness of breath, and wheezing. Onset of symptoms was 2 weeks ago, gradually worsening since that time. She is drinking moderate amounts of fluids. Evaluation to date: none. Treatment to date: antibiotics and antihistamines. She has been on Amoxicillin x4 days due to a dental infection. She has a history of COPD. She does not smoke.    ROS: Negative unless specifically indicated above in HPI.   Relevant past medical history reviewed and updated as indicated.   Allergies and medications reviewed and updated.   Current Outpatient  Medications:  .  amoxicillin (AMOXIL) 500 MG capsule, Take 500 mg by mouth 3 (three) times daily., Disp: , Rfl:  .  Ascorbic Acid (VITAMIN C) 1000 MG tablet, Take 1,000 mg by mouth daily., Disp: , Rfl:  .  cholecalciferol (VITAMIN D) 1000 units tablet, Take 10,000 Units by mouth daily. Vitamin d 3, Disp: , Rfl:  .  diphenhydrAMINE (BENADRYL) 25 MG tablet, Take 25 mg by mouth at bedtime., Disp: , Rfl:  .  Evolocumab (REPATHA SURECLICK) XX123456 MG/ML SOAJ, Inject 140 mg into the skin every 14 (fourteen) days., Disp: 6 mL, Rfl: 3 .  ipratropium (ATROVENT) 0.03 % nasal spray, Place 2 sprays into both nostrils every 12 (twelve) hours., Disp: 30 mL, Rfl: 1 .  KRILL OIL PO, Take 250 mg by mouth daily., Disp: , Rfl:  .  Loratadine 10 MG CAPS, Take 10 mg by mouth daily., Disp: , Rfl:  .  Multiple Vitamins-Minerals (CENTRUM SILVER PO), Take by mouth daily at 2 PM., Disp: , Rfl:  .  ipratropium-albuterol (DUONEB) 0.5-2.5 (3) MG/3ML SOLN, Take 3 mLs by nebulization 3 (three) times daily. (Patient not taking: Reported on 01/26/2023), Disp: 270 mL, Rfl: 11  Allergies  Allergen Reactions  . Bevespi Aerosphere [Glycopyrrolate-Formoterol] Other (See Comments)    Patient felt like her throat was closing up  . Atorvastatin Other (See Comments)    myalgia  . Crestor [Rosuvastatin] Other (See Comments)    Leg cramps  . Duloxetine Diarrhea  . Lamotrigine Rash  . Latex  Rash  . Statins Other (See Comments)    Leg cramps    Objective:   BP 110/62   Pulse 68   Temp (!) 97.5 F (36.4 C)   Resp 20   Ht '5\' 4"'$  (1.626 m)   Wt 133 lb (60.3 kg)   BMI 22.83 kg/m    Physical Exam Vitals reviewed.  Constitutional:      General: She is not in acute distress.    Appearance: Normal appearance. She is not ill-appearing, toxic-appearing or diaphoretic.  HENT:     Head: Normocephalic and atraumatic.     Right Ear: Tympanic membrane, ear canal and external ear normal. There is no impacted cerumen.     Left Ear:  Tympanic membrane, ear canal and external ear normal. There is no impacted cerumen.     Nose: Nose normal. No congestion or rhinorrhea.     Right Sinus: No maxillary sinus tenderness or frontal sinus tenderness.     Left Sinus: No maxillary sinus tenderness or frontal sinus tenderness.     Mouth/Throat:     Lips: No lesions.     Mouth: Mucous membranes are moist. No oral lesions.     Pharynx: Oropharynx is clear. Posterior oropharyngeal erythema present. No pharyngeal swelling or oropharyngeal exudate.  Eyes:     General: No scleral icterus.       Right eye: No discharge.        Left eye: No discharge.     Conjunctiva/sclera: Conjunctivae normal.  Cardiovascular:     Rate and Rhythm: Normal rate and regular rhythm.     Heart sounds: Normal heart sounds. No murmur heard.    No friction rub. No gallop.  Pulmonary:     Effort: Pulmonary effort is normal. No respiratory distress.     Breath sounds: Normal breath sounds. No stridor. No wheezing, rhonchi or rales.  Musculoskeletal:        General: Normal range of motion.     Cervical back: Normal range of motion.  Lymphadenopathy:     Cervical: No cervical adenopathy.  Skin:    General: Skin is warm and dry.     Capillary Refill: Capillary refill takes less than 2 seconds.  Neurological:     General: No focal deficit present.     Mental Status: She is alert and oriented to person, place, and time. Mental status is at baseline.  Psychiatric:        Mood and Affect: Mood normal.        Behavior: Behavior normal.        Thought Content: Thought content normal.        Judgment: Judgment normal.

## 2023-01-26 NOTE — Patient Instructions (Signed)
Throat lozenges Chloraseptic spray Tylenol for pain  Stop Loratidine, start Xyzal Stop Atrovent, start Flonase

## 2023-01-28 LAB — CULTURE, GROUP A STREP

## 2023-02-02 ENCOUNTER — Other Ambulatory Visit: Payer: Self-pay | Admitting: Nurse Practitioner

## 2023-02-18 ENCOUNTER — Emergency Department (HOSPITAL_BASED_OUTPATIENT_CLINIC_OR_DEPARTMENT_OTHER)
Admission: EM | Admit: 2023-02-18 | Discharge: 2023-02-18 | Disposition: A | Discharge status: Home / Self Care | Payer: Medicare HMO | Attending: Emergency Medicine | Admitting: Emergency Medicine

## 2023-02-18 ENCOUNTER — Encounter (HOSPITAL_BASED_OUTPATIENT_CLINIC_OR_DEPARTMENT_OTHER): Payer: Self-pay | Admitting: Emergency Medicine

## 2023-02-18 ENCOUNTER — Other Ambulatory Visit (HOSPITAL_BASED_OUTPATIENT_CLINIC_OR_DEPARTMENT_OTHER): Payer: Self-pay

## 2023-02-18 ENCOUNTER — Other Ambulatory Visit: Payer: Self-pay

## 2023-02-18 DIAGNOSIS — I10 Essential (primary) hypertension: Secondary | ICD-10-CM | POA: Diagnosis not present

## 2023-02-18 DIAGNOSIS — I159 Secondary hypertension, unspecified: Secondary | ICD-10-CM | POA: Insufficient documentation

## 2023-02-18 DIAGNOSIS — Z79899 Other long term (current) drug therapy: Secondary | ICD-10-CM | POA: Diagnosis not present

## 2023-02-18 DIAGNOSIS — Z91040 Latex allergy status: Secondary | ICD-10-CM | POA: Diagnosis not present

## 2023-02-18 MED ORDER — HYDRALAZINE HCL 10 MG PO TABS: 10.0000 mg | ORAL_TABLET | Freq: Three times a day (TID) | ORAL | 0 refills | Status: DC | PRN

## 2023-02-18 NOTE — Discharge Instructions (Addendum)
You have been seen and discharged from the emergency department.  Your blood pressure has down trended here in the department.  It is important that you follow-up with your primary doctor for reevaluation to see if this elevated blood pressure is consistent.  You have been prescribed hydralazine.  If your blood pressure is over 180 on the top number you may take 1 dose of this medicine up to 3 times a day.  However if you need daily management medication this will be prescribed by her primary doctor.  Follow-up with your primary provider for further evaluation and further care. Take home medications as prescribed. If you have any worsening symptoms, severe headache, neck pain/stiffness, chest pain or shortness of breath or further concerns for your health please return to an emergency department for further evaluation.

## 2023-02-18 NOTE — ED Provider Notes (Signed)
[facility] Provider Note   CSN: [id number] Arrival date & time: 02/18/23  1144     History  Chief Complaint  Patient presents with   Hypertension    Wendy Patrick is a 77 y.o. female.  HPI   77 year old female presents to the emergency department with concern for elevated blood pressure.  Patient takes her blood pressure daily.  Her systolics are typically around 1 10-1 20.  However this morning she felt a fullness in her head, when they took her blood pressure her systolics were 1 [id number].  She denies any chest pain, back pain or shortness of breath.  She states since being here the head sensation has resolved.  Her blood pressure is also naturally downtrended.  She is never required daily medication for high blood pressure.  She admits to being emotionally stressed with personal things going on at home.  Otherwise denies any acute change in her diet or health.  Home Medications Prior to Admission medications   Medication Sig Start Date End Date Taking? Authorizing Provider  hydrALAZINE (APRESOLINE) 10 MG tablet Take 1 tablet (10 mg total) by mouth 3 (three) times daily as needed. 02/18/23  Yes Eason Housman, Alvin Critchley, DO  amoxicillin (AMOXIL) 500 MG capsule Take 500 mg by mouth 3 (three) times daily. 01/22/23   [provider]  Ascorbic Acid (VITAMIN C) 1000 MG tablet Take 1,000 mg by mouth daily.    [provider]  cholecalciferol (VITAMIN D) 1000 units tablet Take 10,000 Units by mouth daily. Vitamin d 3    [provider]  diphenhydrAMINE (BENADRYL) 25 MG tablet Take 25 mg by mouth at bedtime.    [provider]  Evolocumab (REPATHA SURECLICK) XX123456 MG/ML SOAJ Inject 140 mg into the skin every 14 (fourteen) days. 01/02/23   Hilty, Nadean Corwin, MD  fluticasone (FLONASE) 50 MCG/ACT nasal spray Place 2 sprays into both nostrils daily. 01/26/23   Loman Brooklyn, FNP  ipratropium (ATROVENT) 0.03 % nasal spray Place 2  sprays into both nostrils every 12 (twelve) hours. 09/29/22   Martyn Ehrich, NP  ipratropium-albuterol (DUONEB) 0.5-2.5 (3) MG/3ML SOLN Take 3 mLs by nebulization 3 (three) times daily. Patient not taking: Reported on 01/26/2023 11/10/22   Martyn Ehrich, NP  KRILL OIL PO Take 250 mg by mouth daily.    [provider]  levocetirizine (XYZAL) 5 MG tablet Take 1 tablet (5 mg total) by mouth every evening. 01/26/23   Loman Brooklyn, FNP  Multiple Vitamins-Minerals (CENTRUM SILVER PO) Take by mouth daily at 2 PM.    [provider]      Allergies    Bevespi aerosphere [glycopyrrolate-formoterol], Atorvastatin, Crestor [rosuvastatin], Duloxetine, Lamotrigine, Latex, and Statins    Review of Systems   Review of Systems  Constitutional:  Negative for fever.  Respiratory:  Negative for shortness of breath.   Cardiovascular:  Negative for chest pain.  Gastrointestinal:  Negative for abdominal pain, diarrhea and vomiting.  Musculoskeletal:  Negative for back pain.  Skin:  Negative for rash.  Neurological:  Negative for dizziness, syncope, facial asymmetry, speech difficulty, numbness and headaches.    Physical Exam Updated Vital Signs BP (!) 152/76   Pulse 80   Temp 97.9 F (36.6 C) (Oral)   Resp 16   SpO2 97%  Physical Exam Vitals and nursing note reviewed.  Constitutional:      General: She is not in acute distress.    Appearance: Normal  appearance. She is not ill-appearing.  HENT:     Head: Normocephalic.     Mouth/Throat:     Mouth: Mucous membranes are moist.  Eyes:     Pupils: Pupils are equal, round, and reactive to light.  Cardiovascular:     Rate and Rhythm: Normal rate.  Pulmonary:     Effort: Pulmonary effort is normal. No respiratory distress.  Abdominal:     Palpations: Abdomen is soft.     Tenderness: There is no abdominal tenderness.  Musculoskeletal:        General: No swelling.  Skin:    General: Skin is warm.  Neurological:      Mental Status: She is alert and oriented to person, place, and time. Mental status is at baseline.  Psychiatric:        Mood and Affect: Mood normal.     ED Results / Procedures / Treatments   Labs (all labs ordered are listed, but only abnormal results are displayed) Labs Reviewed - No data to display  EKG None  Radiology No results found.  Procedures Procedures    Medications Ordered in ED Medications - No data to display  ED Course/ Medical Decision Making/ A&P                             Medical Decision Making Risk Prescription drug management.   77 year old female presents emergency department with concern for HTN.  They have concerned that her blood pressure was reading 1 XX123456 systolic at home.  She was having a fullness in her head at that time but denied any chest pain, shortness of breath or neurosymptoms.  Since arriving here the blood pressure is naturally downtrended.  She is currently asymptomatic with no complaints.  She is very well-appearing, neuro intact.  In the absence of headache, neurosymptoms or chest pain I do not feel any further emergent workup is warranted. Doubt brain bleed, cva, acs, htn urgency/emergency.  She has follow-up with her primary doctor next week.  I will prescribe her medication to take in the event that her blood pressure becomes elevated over 180.  I will defer daily medication and reevaluation to the primary doctor.  Discussed strict return to ED precautions and the patient and spouse understand.  Patient at this time appears safe and stable for discharge and close outpatient follow up. Discharge plan and strict return to ED precautions discussed, patient verbalizes understanding and agreement.        Final Clinical Impression(s) / ED Diagnoses Final diagnoses:  Secondary hypertension    Rx / DC Orders ED Discharge Orders          Ordered    hydrALAZINE (APRESOLINE) 10 MG tablet  3 times daily PRN        02/18/23  1527              Illiana Losurdo, Alvin Critchley, DO 02/18/23 1530

## 2023-02-18 NOTE — ED Triage Notes (Signed)
Pt reports hypertension that started yesterday. Pt states "I feel like my head is blowing up."

## 2023-02-25 ENCOUNTER — Telehealth: Payer: Self-pay

## 2023-02-25 NOTE — Telephone Encounter (Signed)
        Patient  visited [facility] on 3/20     Telephone encounter attempt :  2nd  A HIPAA compliant voice message was left requesting a return call.  Instructed patient to call back .   [facility] [phone] [address], [place], [place] [address] Phone: [phone] Email: [email or web address]

## 2023-03-10 ENCOUNTER — Ambulatory Visit: Payer: Medicare HMO | Attending: Nurse Practitioner | Admitting: Nurse Practitioner

## 2023-03-10 ENCOUNTER — Encounter: Payer: Self-pay | Admitting: Nurse Practitioner

## 2023-03-10 VITALS — BP 122/72 | HR 77 | Ht 64.0 in | Wt 129.0 lb

## 2023-03-10 DIAGNOSIS — G4733 Obstructive sleep apnea (adult) (pediatric): Secondary | ICD-10-CM

## 2023-03-10 DIAGNOSIS — E785 Hyperlipidemia, unspecified: Secondary | ICD-10-CM

## 2023-03-10 DIAGNOSIS — R413 Other amnesia: Secondary | ICD-10-CM

## 2023-03-10 DIAGNOSIS — I2510 Atherosclerotic heart disease of native coronary artery without angina pectoris: Secondary | ICD-10-CM | POA: Diagnosis not present

## 2023-03-10 DIAGNOSIS — R0609 Other forms of dyspnea: Secondary | ICD-10-CM

## 2023-03-10 DIAGNOSIS — R030 Elevated blood-pressure reading, without diagnosis of hypertension: Secondary | ICD-10-CM | POA: Diagnosis not present

## 2023-03-10 DIAGNOSIS — J449 Chronic obstructive pulmonary disease, unspecified: Secondary | ICD-10-CM

## 2023-03-10 NOTE — Patient Instructions (Addendum)
Medication Instructions:  Your physician recommends that you continue on your current medications as directed. Please refer to the Current Medication list given to you today.  *If you need a refill on your cardiac medications before your next appointment, please call your pharmacy*   Lab Work: NONE ordered at this time of appointment   If you have labs (blood work) drawn today and your tests are completely normal, you will receive your results only by: MyChart Message (if you have MyChart) OR A paper copy in the mail If you have any lab test that is abnormal or we need to change your treatment, we will call you to review the results.   Testing/Procedures: Your physician has requested that you have an echocardiogram. Echocardiography is a painless test that uses sound waves to create images of your heart. It provides your doctor with information about the size and shape of your heart and how well your heart's chambers and valves are working. This procedure takes approximately one hour. There are no restrictions for this procedure. Please do NOT wear cologne, perfume, aftershave, or lotions (deodorant is allowed). Please arrive 15 minutes prior to your appointment time.    Follow-Up: At [facility], you and your health needs are our priority.  As part of our continuing mission to provide you with exceptional heart care, we have created designated Provider Care Teams.  These Care Teams include your primary Cardiologist (physician) and Advanced Practice Providers (APPs -  Physician Assistants and Nurse Practitioners) who all work together to provide you with the care you need, when you need it.  We recommend signing up for the patient portal called "MyChart".  Sign up information is provided on this After Visit Summary.  MyChart is used to connect with patients for Virtual Visits (Telemedicine).  Patients are able to view lab/test results, encounter notes, upcoming appointments, etc.   Non-urgent messages can be sent to your provider as well.   To learn more about what you can do with MyChart, go to [email or web address].    Your next appointment:    First Available   Provider:   Chrystie Nose, MD     Other Instructions

## 2023-03-10 NOTE — Progress Notes (Signed)
Office Visit    Patient Name: Wendy AblesJanice G Patrick Date of Encounter: 03/10/2023  Primary Care Provider:  Myrlene Brokerrawford, Elizabeth A, MD Primary Cardiologist:  Chrystie NoseKenneth C Hilty, MD  Chief Complaint    77 year old female with a history of CAD, hyperlipidemia with statin intolerance, COPD, chronic back pain, depression, and anxiety who presents for follow-up related to CAD and hypertension.    Past Medical History    Past Medical History:  Diagnosis Date   AIN grade I    COPD (chronic obstructive pulmonary disease)    Coronary artery disease 08/2009   40% mid left circ by [place]   Dyspnea    with heavy exertion   Endometriosis    1986   Endometriosis yrs ago   Family history of adverse reaction to anesthesia    sister ponv   GERD (gastroesophageal reflux disease)    Granuloma annulare    Gum lesion    per pt left jawborn has small knot on gum on back tooth, is not painful or draining   Headache    History of gestational diabetes many  yrs ago   History of hiatal hernia    [facility]   History of kidney stones 2012   passed on own   History of migraine yrs ago   Hyperlipidemia    Optic neuritis    2015   Optic neuritis Novembr   Trigeminal neuralgia    Urinary urgency    Vaginal anomaly    tears and bleeds easily   Past Surgical History:  Procedure Laterality Date   ABDOMINAL HYSTERECTOMY  age 77   CARDIAC CATHETERIZATION  2010   COLONOSCOPY  12/2019   3 polyps removed 1 pre cancer   EXCISION OF SKIN TAG N/A 02/14/2021   Procedure: EXCISION OF ANAL LESIONS;  Surgeon: Romie Leveehomas, Alicia, MD;  [facility] [place];  Service: General;  Laterality: N/A;  45 MIN TOTAL   EYE SURGERY Bilateral 2017   both eyes cataracts lens replacement   LEFT HEART CATH AND CORONARY ANGIOGRAPHY N/A 04/17/2020   Procedure: LEFT HEART CATH AND CORONARY ANGIOGRAPHY;  Surgeon: Tonny Bollmanooper, Michael, MD;  [facility];  Service: Cardiovascular;  Laterality: N/A;    MANDIBLE SURGERY     gum tumor removed in [place] not sure if was benign per pt   MINOR HEMORRHOIDECTOMY  yrs ago   RECTAL EXAM UNDER ANESTHESIA N/A 02/14/2021   Procedure: ANAL EXAM UNDER ANESTHESIA;  Surgeon: Romie Leveehomas, Alicia, MD;  [facility] [place];  Service: General;  Laterality: N/A;   TUBAL LIGATION  yrs ago   UPPER GASTROINTESTINAL ENDOSCOPY  12/2019    Allergies  Allergies  Allergen Reactions   Bevespi Aerosphere [Glycopyrrolate-Formoterol] Other (See Comments)    Patient felt like her throat was closing up   Atorvastatin Other (See Comments)    myalgia   Crestor [Rosuvastatin] Other (See Comments)    Leg cramps   Duloxetine Diarrhea   Lamotrigine Rash   Latex Rash   Statins Other (See Comments)    Leg cramps     Labs/Other Studies Reviewed    The following studies were reviewed today: Lexiscan myoview 01/2022:   The study is normal. The study is low risk.   No ST deviation was noted.   Left ventricular function is normal. End diastolic cavity size is normal. End systolic cavity size is normal.   Prior study available for comparison from 05/04/2017.   Normal stress nuclear  study with no ischemia or infarction.  Gated ejection fraction 69% with normal wall motion.   LHC 2021: Left Main Vessel is angiographically normal.  Left Anterior Descending Prox LAD to Mid LAD lesion is 40% stenosed.  Left Circumflex Prox Cx to Mid Cx lesion is 50% stenosed. RFR done to evaluate moderate lesion - RFR = 0.93 (non-ischemic)  Right Coronary Artery Vessel is angiographically normal.    Recent Labs: No results found for requested labs within last 365 days.  Recent Lipid Panel    Component Value Date/Time   CHOL 178 04/29/2022 1012   TRIG 140 04/29/2022 1012   HDL 69 04/29/2022 1012   CHOLHDL 2.6 04/29/2022 1012   CHOLHDL 4 06/05/2016 0843   VLDL 35.8 06/05/2016 0843   LDLCALC 85 04/29/2022 1012    History of Present Illness    77 year old  female with the above past medical history including CAD, hyperlipidemia with statin intolerance, COPD, chronic back pain, depression, and anxiety.    Previously followed by Dr. Mayford Knife, later switched to Dr. Rennis Golden. Cardiac catheterization in 2010 showed 40% stenosis in the left circumflex artery, otherwise, no significant CAD.  She is intolerant to statins due to leg cramps.  Previously managed on Repatha, however this was later discontinued due to side effects. Myoview in 2018 was negative.  She underwent coronary CTA in May 2021 in the setting of recurrent chest pain, which revealed mild to moderate mixed CAD of LAD and RCA, calcium score was 252, FFR was significant for mid to distal left circumflex stenosis.  Cardiac catheterization in 2021 showed nonobstructive 40% stenosis in the proximal to mid LAD, moderate nonobstructive 50% disease in the left circumflex artery.  Medical therapy was recommended. Lexiscan in 2022 was negative for ischemia.  She was last seen in the office on 04/25/2022 and noted occasional chest discomfort at rest, persistent cough.  She denies symptoms concerning for angina.  Lexiscan Myoview was negative for ischemia.  She presented to the ED on 02/18/2023 with concern for elevated BP.  She denies any associated symptoms.  BP improved and she was discharged home.   She presents today for follow-up accompanied by her husband.  Since her last visit she has been stable from a cardiac standpoint.  She does note ongoing mild dyspnea on exertion, persistent cough.  She notes she has not been taking her allergy medicine regularly.  She notes an occasional aching in her chest that occurs when she is lying in bed,  lasts for just minutes at a time and resolves spontaneously. She denies any exertional chest pain. She has had issues with worsening memory impairment.  Overall, her symptoms are unchanged from last year's visit.  Home Medications    Current Outpatient Medications  Medication Sig  Dispense Refill   Ascorbic Acid (VITAMIN C) 1000 MG tablet Take 1,000 mg by mouth daily.     cholecalciferol (VITAMIN D) 1000 units tablet Take 10,000 Units by mouth daily. Vitamin d 3     diphenhydrAMINE (BENADRYL) 25 MG tablet Take 25 mg by mouth at bedtime.     Evolocumab (REPATHA SURECLICK) 140 MG/ML SOAJ Inject 140 mg into the skin every 14 (fourteen) days. 6 mL 3   fluticasone (FLONASE) 50 MCG/ACT nasal spray Place 2 sprays into both nostrils daily. 16 g 0   hydrALAZINE (APRESOLINE) 10 MG tablet Take 1 tablet (10 mg total) by mouth 3 (three) times daily as needed. 10 tablet 0   ipratropium (ATROVENT) 0.03 % nasal spray Place  2 sprays into both nostrils every 12 (twelve) hours. 30 mL 1   KRILL OIL PO Take 250 mg by mouth daily.     levocetirizine (XYZAL) 5 MG tablet Take 1 tablet (5 mg total) by mouth every evening. 30 tablet 0   Multiple Vitamins-Minerals (CENTRUM SILVER PO) Take by mouth daily at 2 PM.     ipratropium-albuterol (DUONEB) 0.5-2.5 (3) MG/3ML SOLN Take 3 mLs by nebulization 3 (three) times daily. (Patient not taking: Reported on 03/10/2023) 270 mL 11   No current facility-administered medications for this visit.     Review of Systems    She denies palpitations, pnd, orthopnea, n, v, dizziness, syncope, edema, weight gain, or early satiety. All other systems reviewed and are otherwise negative except as noted above.   Physical Exam    VS:  BP 122/72   Pulse 77   Ht  (1.626 m)   Wt 129 lb (58.5 kg)   SpO2 98%   BMI 22.14 kg/m   GEN: Well nourished, well developed, in no acute distress. HEENT: normal. Neck: Supple, no JVD, carotid bruits, or masses. Cardiac: RRR, no murmurs, rubs, or gallops. No clubbing, cyanosis, edema.  Radials/DP/PT 2+ and equal bilaterally.  Respiratory:  Respirations regular and unlabored, clear to auscultation bilaterally. GI: Soft, nontender, nondistended, BS + x 4. MS: no deformity or atrophy. Skin: warm and dry, no rash. Neuro:   Strength and sensation are intact. Psych: Normal affect.  Accessory Clinical Findings    ECG personally reviewed by me today -NSR, 77 bpm- no acute changes.   Lab Results  Component Value Date   WBC 5.9 02/28/2022   HGB 14.5 02/28/2022   HCT 43.8 02/28/2022   MCV 85.4 02/28/2022   PLT 295.0 02/28/2022   Lab Results  Component Value Date   CREATININE 0.99 02/28/2022   BUN 15 02/28/2022   NA 141 02/28/2022   K 4.2 02/28/2022   CL 104 02/28/2022   CO2 29 02/28/2022   Lab Results  Component Value Date   ALT 20 02/28/2022   AST 24 02/28/2022   ALKPHOS 73 02/28/2022   BILITOT 0.4 02/28/2022   Lab Results  Component Value Date   CHOL 178 04/29/2022   HDL 69 04/29/2022   LDLCALC 85 04/29/2022   TRIG 140 04/29/2022   CHOLHDL 2.6 04/29/2022    Lab Results  Component Value Date   HGBA1C 6.2 02/28/2022    Assessment & Plan    1.  Elevated BP: Recently elevated BP in the setting of stress.  BP has since normalized.  Continue to monitor BP and report BP consistently greater than 140/80.  For now, continue prn hydralazine.  2. CAD/chest pain/shortness of breath: Cath in 2021 showed nonobstructive 40% stenosis in the proximal to mid LAD, moderate nonobstructive 50% disease in the left circumflex artery; medical therapy was advised.  Recent Lexiscan Myoview was negative for ischemia, normal LV function. She reports ongoing left-sided chest discomfort which she describes as a cramping, with occasional radiation to her left arm, that lasts for seconds to minutes and occurs only at rest.  Recent ischemic evaluation reassuring. She did not tolerate Imdur, declines amlodipine.Continue to monitor symptoms and report more persistent or frequent chest discomfort, worsening exertional symptoms.  Will update echo given ongoing shortness of breath. Reviewed ED precautions. Continue Repatha.    3. Hyperlipidemia: Statin intolerant. LDL was 85 in 03/2022..Will defer annual labs to PCP as she is  due her for her annual physical. Continue  Repatha.    4. COPD/OSA/persistent cough: Suspected sleep apnea per pulmonology.  She denies worsening dyspnea, however she does report a longstanding persistent cough.  LV function was normal on recent stress test. Repeat echo pending as above. Additionally, recent CT chest was stable. Following with pulmonology.   5. Memory impairment: Progressive. Recommend follow-up with PCP/neurology.    6. Disposition: Follow-up in 6 months.       Joylene Grapes, NP 03/10/2023, 10:49 AM

## 2023-03-11 ENCOUNTER — Encounter: Payer: Self-pay | Admitting: Nurse Practitioner

## 2023-04-09 ENCOUNTER — Ambulatory Visit (HOSPITAL_COMMUNITY): Payer: Medicare HMO | Attending: Nurse Practitioner

## 2023-04-09 DIAGNOSIS — R0609 Other forms of dyspnea: Secondary | ICD-10-CM | POA: Insufficient documentation

## 2023-04-09 LAB — ECHOCARDIOGRAM COMPLETE
Area-P 1/2: 3.03 cm2
S' Lateral: 1.7 cm

## 2023-04-13 ENCOUNTER — Telehealth: Payer: Self-pay

## 2023-04-13 NOTE — Telephone Encounter (Signed)
Spoke with pt. Pt was notified of echo results. Pt will continue current medication and fu as planned. 

## 2023-05-04 ENCOUNTER — Ambulatory Visit: Payer: Medicare HMO | Admitting: Pulmonary Disease

## 2023-05-14 DIAGNOSIS — R351 Nocturia: Secondary | ICD-10-CM | POA: Diagnosis not present

## 2023-05-14 DIAGNOSIS — N3941 Urge incontinence: Secondary | ICD-10-CM | POA: Diagnosis not present

## 2023-05-14 DIAGNOSIS — R3912 Poor urinary stream: Secondary | ICD-10-CM | POA: Diagnosis not present

## 2023-05-14 DIAGNOSIS — R350 Frequency of micturition: Secondary | ICD-10-CM | POA: Diagnosis not present

## 2023-05-22 ENCOUNTER — Other Ambulatory Visit: Payer: Self-pay | Admitting: Internal Medicine

## 2023-05-22 DIAGNOSIS — E785 Hyperlipidemia, unspecified: Secondary | ICD-10-CM

## 2023-05-22 DIAGNOSIS — R7303 Prediabetes: Secondary | ICD-10-CM

## 2023-05-25 ENCOUNTER — Other Ambulatory Visit (INDEPENDENT_AMBULATORY_CARE_PROVIDER_SITE_OTHER): Payer: Medicare HMO

## 2023-05-25 DIAGNOSIS — Z8669 Personal history of other diseases of the nervous system and sense organs: Secondary | ICD-10-CM | POA: Diagnosis not present

## 2023-05-25 DIAGNOSIS — R7303 Prediabetes: Secondary | ICD-10-CM | POA: Diagnosis not present

## 2023-05-25 DIAGNOSIS — E785 Hyperlipidemia, unspecified: Secondary | ICD-10-CM

## 2023-05-25 DIAGNOSIS — H9193 Unspecified hearing loss, bilateral: Secondary | ICD-10-CM | POA: Diagnosis not present

## 2023-05-25 LAB — COMPREHENSIVE METABOLIC PANEL
ALT: 16 U/L (ref 0–35)
AST: 22 U/L (ref 0–37)
Albumin: 4.1 g/dL (ref 3.5–5.2)
Alkaline Phosphatase: 75 U/L (ref 39–117)
BUN: 12 mg/dL (ref 6–23)
CO2: 29 mEq/L (ref 19–32)
Calcium: 9.5 mg/dL (ref 8.4–10.5)
Chloride: 103 mEq/L (ref 96–112)
Creatinine, Ser: 1.04 mg/dL (ref 0.40–1.20)
GFR: 51.4 mL/min — ABNORMAL LOW (ref >60.00)
Glucose, Bld: 99 mg/dL (ref 70–99)
Potassium: 3.9 mEq/L (ref 3.5–5.1)
Sodium: 139 mEq/L (ref 135–145)
Total Bilirubin: 0.5 mg/dL (ref 0.2–1.2)
Total Protein: 6.9 g/dL (ref 6.0–8.3)

## 2023-05-25 LAB — CBC
HCT: 46 % (ref 36.0–46.0)
Hemoglobin: 14.8 g/dL (ref 12.0–15.0)
MCHC: 32.2 g/dL (ref 30.0–36.0)
MCV: 86.8 fl (ref 78.0–100.0)
Platelets: 345 10*3/uL (ref 150.0–400.0)
RBC: 5.3 Mil/uL — ABNORMAL HIGH (ref 3.87–5.11)
RDW: 14.3 % (ref 11.5–15.5)
WBC: 6.1 10*3/uL (ref 4.0–10.5)

## 2023-05-25 LAB — LIPID PANEL
Cholesterol: 143 mg/dL (ref 0–200)
HDL: 63.5 mg/dL (ref >39.00)
LDL Cholesterol: 50 mg/dL (ref 0–99)
NonHDL: 79.38
Total CHOL/HDL Ratio: 2
Triglycerides: 149 mg/dL (ref 0.0–149.0)
VLDL: 29.8 mg/dL (ref 0.0–40.0)

## 2023-05-25 LAB — HEMOGLOBIN A1C: Hgb A1c MFr Bld: 6 % (ref 4.6–6.5)

## 2023-06-17 ENCOUNTER — Encounter: Payer: Medicare HMO | Admitting: Internal Medicine

## 2023-06-18 ENCOUNTER — Encounter: Payer: Self-pay | Admitting: Internal Medicine

## 2023-06-18 ENCOUNTER — Ambulatory Visit: Payer: Medicare HMO | Attending: Internal Medicine | Admitting: Internal Medicine

## 2023-06-18 VITALS — BP 98/64 | HR 77 | Ht 64.0 in | Wt 124.6 lb

## 2023-06-18 DIAGNOSIS — T466X5D Adverse effect of antihyperlipidemic and antiarteriosclerotic drugs, subsequent encounter: Secondary | ICD-10-CM | POA: Diagnosis not present

## 2023-06-18 DIAGNOSIS — I25119 Atherosclerotic heart disease of native coronary artery with unspecified angina pectoris: Secondary | ICD-10-CM

## 2023-06-18 DIAGNOSIS — E785 Hyperlipidemia, unspecified: Secondary | ICD-10-CM

## 2023-06-18 DIAGNOSIS — M791 Myalgia, unspecified site: Secondary | ICD-10-CM | POA: Diagnosis not present

## 2023-06-18 NOTE — Patient Instructions (Signed)
Medication Instructions:  NO CHANGES  *If you need a refill on your cardiac medications before your next appointment, please call your pharmacy*    Follow-Up: At [facility], you and your health needs are our priority.  As part of our continuing mission to provide you with exceptional heart care, we have created designated Provider Care Teams.  These Care Teams include your primary Cardiologist (physician) and Advanced Practice Providers (APPs -  Physician Assistants and Nurse Practitioners) who all work together to provide you with the care you need, when you need it.  We recommend signing up for the patient portal called "MyChart".  Sign up information is provided on this After Visit Summary.  MyChart is used to connect with patients for Virtual Visits (Telemedicine).  Patients are able to view lab/test results, encounter notes, upcoming appointments, etc.  Non-urgent messages can be sent to your provider as well.   To learn more about what you can do with MyChart, go to [email or web address].    Your next appointment:    12 months with Dr. Georgeann Oppenheim can renew up to 30 days before your grant expires  [facility] offers assistance to help pay for medication copays.  They will cover copays for all cholesterol lowering meds, including statins, fibrates, omega-3 fish oils like Vascepa, ezetimibe, Repatha, Praluent, Nexletol, Nexlizet.  The cards are usually good for $2,500 or 12 months, whichever comes first. Our fax # is [phone] (you will need this to apply) Go to [email or web address] Click on "Apply Now" Answer questions as to whom is applying (patient or representative) Your disease fund will be "hypercholesterolemia - Medicare access" They will ask questions about finances and which medications you are taking for cholesterol When you submit, the approval is usually within minutes.  You will need to print the card information from the site You will  need to show this information to your pharmacy, they will bill your Medicare Part D plan first -then bill [employer] --for the copay.   You can also call them at [phone], although the hold times can be quite long.

## 2023-06-18 NOTE — Progress Notes (Signed)
OFFICE NOTE  Chief Complaint:  No complaints  Primary Care Physician: Myrlene Broker, MD  HPI:  Wendy Patrick is a 77 y.o. female with a past medial history significant for mild coronary artery disease with a 40% circumflex stenosis in 2010, COPD, type 2 diabetes, hyperlipidemia and statin intolerance.  She was previously followed by Dr. Mayford Knife however requested to switch to my care since her husband is a patient of mine.  We will try to see them simultaneously.  Recently she has been having some issues with diarrhea.  She is been undergoing treatment with antibiotics and care by Dr. Ewing Schlein.  She feels drained.  She is also congested and having difficulty with allergies and chronic cough.  She reports labile blood pressures from the upper 90s to 140s, but has no history of hypertension and is not on medication.  In fact she takes only aspirin.  She has been previously seen by one of our PharmD's in the lipid clinic to discuss her dyslipidemia and was offered ezetimibe, but did not take the medication due to cost issues.  Currently she reports some discomfort in the left shoulder arm and left back.  This is similar but much less significant to the discomfort she had in 2010.  She says she gets it occasionally.  Is not necessarily worse with exertion or relieved by rest and does not sound particularly anginal to me.  She did have a nuclear stress test last year which was completely normal.  11/10/2018  Wendy Patrick returns today for routine follow-up.  She does have a history of coronary artery disease with some circumflex stenosis of 40% in 2010, type 2 diabetes, hyperlipidemia and statin intolerance with a goal LDL less than 70.  Her main issues have been with some degree of neuropathy for which she is on gabapentin.  She says she is not tolerating this well and feels tired and is having some mood changes related to that.  Unfortunately she has been statin intolerant.  She has been on both  atorvastatin and rosuvastatin which caused leg cramps.  We had discussed PCSK9 inhibitor therapy in the past however had not pursued it.  Her most recent lab work from June 2018 showed total cholesterol 270, HDL 60, LDL 178 and triglycerides 962.  Although her diet sound somewhat atherogenic, there may be a component of FH.   07/26/2019  Wendy Patrick is seen today in follow-up.  She is also been using Repatha.  Overall she seems to be tolerating however she has been having some concerns with runny nose and persistent cough.  This is atypical for the medication and she was concerned though it may be a side effect.  She needs a recheck of her lipids.  She was on gabapentin for neuropathy but this is been discontinued and has been seeing neurologist.  There was concern that she may have some nerve damage from an injection in her teeth that may be related to her cough and some possible difficulty swallowing.  02/22/2020  Wendy Patrick is seen today in follow-up with her husband. Unfortunately she stopped using Repatha. She had had a good response in her LDL cholesterol which went from 171 down to 119. I suspect that she could have FH as there is a strong family history of heart disease in her parents. She has had no cardiac symptoms that I was aware of however recently did have some left-sided chest discomfort. It apparently was pretty persistent and lasted for couple  hours. It was not associated with exertion or relieved by rest however did radiate into her left arm. According to her husband she had had a cardiovascular work-up by Dr. Kirke Corin in the past which showed mild nonobstructive coronary disease with up to about a 40% stenosis. This was in 2010.  04/13/2020  Wendy Patrick is seen today in follow-up.  She underwent CT with FFR for ongoing chest pain on Apr 04, 2020.  I personally reviewed the study and demonstrated at least moderate mixed coronary disease in the circumflex with mild to moderate mixed coronary disease  of the LAD and RCA.  Coronary calcium score was 252, 76 percentile for age and sex matched control.  CT FFR was performed which demonstrated a significant flow-limiting mid to distal circumflex stenosis.  The FFR dropped suddenly from 0.97-0.62 suggesting a focal stenosis.  She had previously had a 40% stenosis in 2010 in this area and I suspect that has progressed.  Unfortunately she has been statin intolerant.  She also had been on Repatha but stopped it due to side effects.  In addition the over read of the CT suggested emphysema as well as subcarinal adenopathy.  She had previously seen Dr. Sherene Sires with pulmonary who performed some chest x-rays and had been treating her for cough which did not improve.  She would like to follow up with a different pulmonologist.  I will refer her.  08/16/2020  Wendy Patrick is seen today in follow-up.  Based on her abnormal CT FFR she underwent definitive left heart catheterization in May 2021.  This demonstrated nonobstructive coronary disease with mild nonobstructive disease of the proximal to mid LAD and mid left circumflex.  LVEDP was normal.  Maximal medical therapy was recommended.  Unfortunate she cannot tolerate Repatha or statins.  LDL remained above target, last assessed in August 2020 at 119.  She is following a Mediterranean diet.  She will need repeat lipids.  She denies any chest pain.  She is working with our pulmonary and was noted to have some subcarinal lymphadenopathy.  She is scheduled to have pulmonary function testing tomorrow and a follow-up with Dr. Chestine Spore.  08/13/2021  Wendy Patrick returns today for follow-up.  She is accompanied by her husband.  She says she is denying any chest pain or shortness of breath.  She does get some left sided pain over the lower ribs which can occur at rest typically sitting in the chair.  Cardiac catheterization last year showed nonobstructive coronary disease however she does have coronary calcification and a very high LDL  cholesterol over 190 as assessed in September 2021.  She is due for repeat lipids.  Pressure is well controlled today.  06/18/2023  Ms. Novitski is seen today in follow-up with her husband.  She says she is going downhill referring to a number of medical issues including trouble with her vision and hearing as well as episodes of diarrhea and instability leading to near falls.  She has been on Repatha but feels it may be worsening her diarrhea.  Her husband who also is on Repatha does not necessarily think that is the case.  Nonetheless she has had a good response to her lipids including a recent lipid profile showing total cholesterol 143, triglycerides 149, HDL 63 and LDL 50.  She is denied any chest pain or shortness of breath.  She did have an evaluation for chest pain earlier this year including stress test which was negative for ischemia.  She also had an  echocardiogram in May 2024 which showed an EF 65 to 70%.   PMHx:  Past Medical History:  Diagnosis Date   AIN grade I    COPD (chronic obstructive pulmonary disease) (HCC)    Coronary artery disease 08/2009   40% mid left circ by cath in [place], [place]   Dyspnea    with heavy exertion   Endometriosis    1986   Endometriosis yrs ago   Family history of adverse reaction to anesthesia    sister ponv   GERD (gastroesophageal reflux disease)    Granuloma annulare    Gum lesion    per pt left jawborn has small knot on gum on back tooth, is not painful or draining   Headache    History of gestational diabetes many  yrs ago   History of hiatal hernia    [facility]   History of kidney stones 07-20-2011   passed on own   History of migraine yrs ago   Hyperlipidemia    Optic neuritis    07-19-14   Optic neuritis Novembr   Trigeminal neuralgia    Urinary urgency    Vaginal anomaly    tears and bleeds easily    Past Surgical History:  Procedure Laterality Date   ABDOMINAL HYSTERECTOMY  age 9   CARDIAC CATHETERIZATION  2010   COLONOSCOPY   12/2019   3 polyps removed 1 pre cancer   EXCISION OF SKIN TAG N/A 02/14/2021   Procedure: EXCISION OF ANAL LESIONS;  Surgeon: Romie Levee, MD;  Location: [facility];  Service: General;  Laterality: N/A;  45 MIN TOTAL   EYE SURGERY Bilateral July 19, 2016   both eyes cataracts lens replacement   LEFT HEART CATH AND CORONARY ANGIOGRAPHY N/A 04/17/2020   Procedure: LEFT HEART CATH AND CORONARY ANGIOGRAPHY;  Surgeon: Tonny Bollman, MD;  Location: [facility] INVASIVE CV LAB;  Service: Cardiovascular;  Laterality: N/A;   MANDIBLE SURGERY     gum tumor removed in [place] not sure if was benign per pt   MINOR HEMORRHOIDECTOMY  yrs ago   RECTAL EXAM UNDER ANESTHESIA N/A 02/14/2021   Procedure: ANAL EXAM UNDER ANESTHESIA;  Surgeon: Romie Levee, MD;  Location: [facility];  Service: General;  Laterality: N/A;   TUBAL LIGATION  yrs ago   UPPER GASTROINTESTINAL ENDOSCOPY  12/2019    FAMHx:  Family History  Problem Relation Age of Onset   Cancer Mother        Breast    Breast cancer Mother    Stroke Brother        open heart surgery    Colon cancer Neg Hx    Esophageal cancer Neg Hx    Rectal cancer Neg Hx    Stomach cancer Neg Hx     SOCHx:   reports that she quit smoking about 27 years ago. Her smoking use included cigarettes. She started smoking about 67 years ago. She has a 80 pack-year smoking history. She has never used smokeless tobacco. She reports current alcohol use of about 3.0 standard drinks of alcohol per week. She reports that she does not use drugs.  ALLERGIES:  Allergies  Allergen Reactions   Bevespi Aerosphere [Glycopyrrolate-Formoterol] Other (See Comments)    Patient felt like her throat was closing up   Atorvastatin Other (See Comments)    myalgia   Crestor [Rosuvastatin] Other (See Comments)    Leg cramps   Duloxetine Diarrhea   Lamotrigine Rash   Latex Rash  Statins Other (See Comments)    Leg cramps    ROS: Pertinent items noted  in HPI and remainder of comprehensive ROS otherwise negative.  HOME MEDS: Current Outpatient Medications on File Prior to Visit  Medication Sig Dispense Refill   Ascorbic Acid (VITAMIN C) 1000 MG tablet Take 1,000 mg by mouth daily.     cholecalciferol (VITAMIN D) 1000 units tablet Take 10,000 Units by mouth daily. Vitamin d 3     diphenhydrAMINE (BENADRYL) 25 MG tablet Take 25 mg by mouth at bedtime.     Evolocumab (REPATHA SURECLICK) 140 MG/ML SOAJ Inject 140 mg into the skin every 14 (fourteen) days. 6 mL 3   fluticasone (FLONASE) 50 MCG/ACT nasal spray Place 2 sprays into both nostrils daily. 16 g 0   hydrALAZINE (APRESOLINE) 10 MG tablet Take 1 tablet (10 mg total) by mouth 3 (three) times daily as needed. 10 tablet 0   ipratropium (ATROVENT) 0.03 % nasal spray Place 2 sprays into both nostrils every 12 (twelve) hours. 30 mL 1   ipratropium-albuterol (DUONEB) 0.5-2.5 (3) MG/3ML SOLN Take 3 mLs by nebulization 3 (three) times daily. 270 mL 11   KRILL OIL PO Take 250 mg by mouth daily.     levocetirizine (XYZAL) 5 MG tablet Take 1 tablet (5 mg total) by mouth every evening. 30 tablet 0   Multiple Vitamins-Minerals (CENTRUM SILVER PO) Take by mouth daily at 2 PM.     No current facility-administered medications on file prior to visit.    LABS/IMAGING: No results found for this or any previous visit (from the past 48 hour(s)).  No results found.  LIPID PANEL:    Component Value Date/Time   CHOL 143 05/25/2023 0803   CHOL 178 04/29/2022 1012   TRIG 149.0 05/25/2023 0803   HDL 63.50 05/25/2023 0803   HDL 69 04/29/2022 1012   CHOLHDL 2 05/25/2023 0803   VLDL 29.8 05/25/2023 0803   LDLCALC 50 05/25/2023 0803   LDLCALC 85 04/29/2022 1012     WEIGHTS: Wt Readings from Last 3 Encounters:  06/18/23 124 lb 9.6 oz (56.5 kg)  03/10/23 129 lb (58.5 kg)  01/26/23 133 lb (60.3 kg)    VITALS: BP 98/64   Pulse 77   Ht 5\' 4"  (1.626 m)   Wt 124 lb 9.6 oz (56.5 kg)   SpO2 94%   BMI  21.39 kg/m   EXAM: General appearance: alert and no distress Neck: no carotid bruit, no JVD and thyroid not enlarged, symmetric, no tenderness/mass/nodules Lungs: diminished breath sounds bibasilar Heart: regular rate and rhythm Abdomen: soft, non-tender; bowel sounds normal; no masses,  no organomegaly Extremities: extremities normal, atraumatic, no cyanosis or edema Pulses: 2+ and symmetric Skin: Skin color, texture, turgor normal. No rashes or lesions Neurologic: Grossly normal Psych: Pleasant  EKG: EKG Interpretation Date/Time:  Thursday June 18 2023 11:13:57 EDT Ventricular Rate:  77 PR Interval:  168 QRS Duration:  74 QT Interval:  402 QTC Calculation: 454 R Axis:   75  Text Interpretation: Normal sinus rhythm Normal ECG When compared with ECG of 17-Apr-2020 11:16, No significant change was found Confirmed by Zoila Shutter [phone]) on 06/18/2023 11:24:48 AM    ASSESSMENT: History of chest pain-abnormal CT FFR suggesting flow-limiting stenosis of the mid to distal circumflex, however cardiac catheterization in 03/2020 demonstrated mild nonobstructive disease of the mid circumflex and proximal LAD -negative Myoview stress test (01/2022) Mild nonobstructive coronary disease by cath (2010) -40% mid circumflex lesion Dyslipidemia-statin intolerant Emphysema Subcarinal  lymphadenopathy  PLAN: 1.   Mrs. Mansouri has not had recent chest pain.  She had a negative Myoview stress test in 2023 and an echo in May 2024 which was reassuring.  She has been statin intolerant on Repatha with good response to her lipids and LDL now at 50 although she has been having some diarrhea which she thinks might be related to that.  We talked about pursuing other options but she wishes to stick with it for the time being.  She is on a health well grant.  No other changes to her medicines at this time.  Plan follow-up with me annually or sooner as necessary.  Chrystie Nose, MD, [facility], FACP  [place]   [facility]  Medical Director of the [facility] Lipid [facility] &  Cardiovascular Risk Reduction Clinic Diplomate of the American Board of Clinical Lipidology Attending Cardiologist  Direct Dial: [phone]  Fax: [phone]  Website:  [email or web address] 06/18/2023, 11:27 AM

## 2023-06-19 ENCOUNTER — Ambulatory Visit (INDEPENDENT_AMBULATORY_CARE_PROVIDER_SITE_OTHER): Payer: Medicare HMO | Admitting: Internal Medicine

## 2023-06-19 ENCOUNTER — Encounter: Payer: Self-pay | Admitting: Internal Medicine

## 2023-06-19 VITALS — BP 138/80 | HR 83 | Temp 97.7°F | Ht 64.0 in | Wt 124.4 lb

## 2023-06-19 DIAGNOSIS — R413 Other amnesia: Secondary | ICD-10-CM

## 2023-06-19 DIAGNOSIS — H903 Sensorineural hearing loss, bilateral: Secondary | ICD-10-CM | POA: Diagnosis not present

## 2023-06-19 DIAGNOSIS — Z0000 Encounter for general adult medical examination without abnormal findings: Secondary | ICD-10-CM | POA: Diagnosis not present

## 2023-06-19 DIAGNOSIS — R7303 Prediabetes: Secondary | ICD-10-CM | POA: Diagnosis not present

## 2023-06-19 DIAGNOSIS — E7800 Pure hypercholesterolemia, unspecified: Secondary | ICD-10-CM

## 2023-06-19 DIAGNOSIS — G319 Degenerative disease of nervous system, unspecified: Secondary | ICD-10-CM | POA: Insufficient documentation

## 2023-06-19 NOTE — Assessment & Plan Note (Signed)
Flu shot yearly. Pneumonia complete. Shingrix due at pharmacy. Tetanus due at pharmacy. Colonoscopy due soon if desired. Mammogram aged out, pap smear aged out and dexa complete. Counseled about sun safety and mole surveillance. Counseled about the dangers of distracted driving. Given 10 year screening recommendations.

## 2023-06-19 NOTE — Assessment & Plan Note (Signed)
Difficult history as we were discussing patient segwayed to a marital issue causing stress between the patient and husband and this derailed a lot of the conversation. Husband had initial concerns about patient struggling with following recipes and forgetting in the middle of them. Some name finding difficulty. It was unclear to me whether their recent marital issues may be accurate from patient perspective or if her memory could be causing some concerns which are not as she is perceiving them. I was unable to get any clarity on this and will get MRI to start check. Reviewed recent labs with her.

## 2023-06-19 NOTE — Assessment & Plan Note (Signed)
Recent lipid panel at goal continue repatha and is unable to take statins.

## 2023-06-19 NOTE — Patient Instructions (Signed)
We will see you back in 3-6 months just to check on you.

## 2023-06-19 NOTE — Assessment & Plan Note (Signed)
Recent HgA1c reviewed with patient and she did have history of gestational diabetes.

## 2023-06-19 NOTE — Progress Notes (Signed)
   Subjective:   Patient ID: Wendy Patrick, female    DOB: Jul 02, 1946, 77 y.o.   MRN: [id number]  HPI The patient is here for physical. Has some acute concerns about memory.   PMH, [facility], social history reviewed and updated  Review of Systems  Constitutional: Negative.   HENT: Negative.    Eyes: Negative.   Respiratory:  Negative for cough, chest tightness and shortness of breath.   Cardiovascular:  Negative for chest pain, palpitations and leg swelling.  Gastrointestinal:  Negative for abdominal distention, abdominal pain, constipation, diarrhea, nausea and vomiting.  Musculoskeletal: Negative.   Skin: Negative.   Neurological: Negative.        Memory changes  Psychiatric/Behavioral:  Positive for decreased concentration, dysphoric mood and sleep disturbance. The patient is nervous/anxious.     Objective:  Physical Exam Constitutional:      Appearance: She is well-developed.  HENT:     Head: Normocephalic and atraumatic.  Cardiovascular:     Rate and Rhythm: Normal rate and regular rhythm.  Pulmonary:     Effort: Pulmonary effort is normal. No respiratory distress.     Breath sounds: Normal breath sounds. No wheezing or rales.  Abdominal:     General: Bowel sounds are normal. There is no distension.     Palpations: Abdomen is soft.     Tenderness: There is no abdominal tenderness. There is no rebound.  Musculoskeletal:     Cervical back: Normal range of motion.  Skin:    General: Skin is warm and dry.  Neurological:     Mental Status: She is alert and oriented to person, place, and time.     Coordination: Coordination normal.     Vitals:   06/19/23 1025  BP: 138/80  Pulse: 83  Temp: 97.7 F (36.5 C)  TempSrc: Oral  SpO2: 99%  Weight: 124 lb 6 oz (56.4 kg)  Height: 5\' 4"  (1.626 m)    Assessment & Plan:

## 2023-06-23 DIAGNOSIS — N3941 Urge incontinence: Secondary | ICD-10-CM | POA: Diagnosis not present

## 2023-06-23 DIAGNOSIS — R3912 Poor urinary stream: Secondary | ICD-10-CM | POA: Diagnosis not present

## 2023-06-23 DIAGNOSIS — R350 Frequency of micturition: Secondary | ICD-10-CM | POA: Diagnosis not present

## 2023-07-05 ENCOUNTER — Ambulatory Visit
Admission: RE | Admit: 2023-07-05 | Discharge: 2023-07-05 | Disposition: A | Discharge status: Home / Self Care | Payer: Medicare HMO | Source: Ambulatory Visit | Attending: Internal Medicine | Admitting: Internal Medicine

## 2023-07-05 DIAGNOSIS — R413 Other amnesia: Secondary | ICD-10-CM | POA: Diagnosis not present

## 2023-07-11 ENCOUNTER — Encounter: Payer: Self-pay | Admitting: Internal Medicine

## 2023-07-27 ENCOUNTER — Ambulatory Visit (INDEPENDENT_AMBULATORY_CARE_PROVIDER_SITE_OTHER): Payer: Medicare HMO | Admitting: Internal Medicine

## 2023-07-27 ENCOUNTER — Encounter: Payer: Self-pay | Admitting: Internal Medicine

## 2023-07-27 VITALS — BP 144/82 | HR 109 | Temp 98.0°F | Ht 64.0 in | Wt 124.0 lb

## 2023-07-27 DIAGNOSIS — G319 Degenerative disease of nervous system, unspecified: Secondary | ICD-10-CM | POA: Diagnosis not present

## 2023-07-27 MED ORDER — DONEPEZIL HCL 5 MG PO TABS
5.0000 mg | ORAL_TABLET | Freq: Every day | ORAL | 3 refills | Status: DC
Start: 2023-07-27 — End: 2024-05-03

## 2023-07-27 NOTE — Assessment & Plan Note (Signed)
Discussed likely associated with small vessel disease. She does have some signs of memory changes. Advised aricept 5 mg daily start and they agree. New rx done today. Will follow up 3-6 months. She is taking prevagen and seeing some benefit and can continue this as well. Encouraged mental and physical activity and lists.

## 2023-07-27 NOTE — Patient Instructions (Signed)
We have sent in the aricept (donepezil) to take 1 pill daily. This should help the memory to stay more the same over time and not decline.

## 2023-07-27 NOTE — Progress Notes (Signed)
   Subjective:   Patient ID: Wendy Patrick, female    DOB: 03/28/1946, 77 y.o.   MRN: [id number]  HPI The patient is a 77 YO female coming in for discussion about memory and MRI.   Review of Systems  Constitutional: Negative.   HENT: Negative.    Eyes: Negative.   Respiratory:  Negative for cough, chest tightness and shortness of breath.   Cardiovascular:  Negative for chest pain, palpitations and leg swelling.  Gastrointestinal:  Negative for abdominal distention, abdominal pain, constipation, diarrhea, nausea and vomiting.  Musculoskeletal: Negative.   Skin: Negative.   Neurological: Negative.        Memory change  Psychiatric/Behavioral: Negative.      Objective:  Physical Exam Constitutional:      Appearance: She is well-developed.  HENT:     Head: Normocephalic and atraumatic.  Cardiovascular:     Rate and Rhythm: Normal rate and regular rhythm.  Pulmonary:     Effort: Pulmonary effort is normal. No respiratory distress.     Breath sounds: Normal breath sounds. No wheezing or rales.  Abdominal:     General: Bowel sounds are normal. There is no distension.     Palpations: Abdomen is soft.     Tenderness: There is no abdominal tenderness. There is no rebound.  Musculoskeletal:     Cervical back: Normal range of motion.  Skin:    General: Skin is warm and dry.  Neurological:     Mental Status: She is alert and oriented to person, place, and time.     Coordination: Coordination normal.     Vitals:   07/27/23 0839 07/27/23 0841  BP: (!) 144/82 (!) 144/82  Pulse: (!) 109   Temp: 98 F (36.7 C)   TempSrc: Oral   SpO2: 97%   Weight: 124 lb (56.2 kg)   Height: 5\' 4"  (1.626 m)     Assessment & Plan:

## 2023-07-28 DIAGNOSIS — R351 Nocturia: Secondary | ICD-10-CM | POA: Diagnosis not present

## 2023-07-28 DIAGNOSIS — R350 Frequency of micturition: Secondary | ICD-10-CM | POA: Diagnosis not present

## 2023-07-28 DIAGNOSIS — N3941 Urge incontinence: Secondary | ICD-10-CM | POA: Diagnosis not present

## 2023-08-05 ENCOUNTER — Encounter: Payer: Self-pay | Admitting: Internal Medicine

## 2023-08-27 ENCOUNTER — Encounter: Payer: Self-pay | Admitting: Internal Medicine

## 2023-09-01 ENCOUNTER — Encounter: Payer: Self-pay | Admitting: Internal Medicine

## 2023-09-02 ENCOUNTER — Other Ambulatory Visit: Payer: Self-pay | Admitting: Internal Medicine

## 2023-09-07 ENCOUNTER — Telehealth: Payer: Self-pay

## 2023-09-07 ENCOUNTER — Encounter: Payer: Self-pay | Admitting: Internal Medicine

## 2023-09-07 NOTE — Telephone Encounter (Signed)
Dr. Rennis Golden and Eileen Stanford, please note that the Wilber Bihari has been approved. The patient will be scheduled as soon as possible.  Auth Submission: APPROVED Site of care: Site of care: [facility] Payer: [employer] Medication & CPT/J Code(s) submitted: Leqvio (Inclisiran) [phone] Route of submission (phone, fax, portal): portal Phone # Fax # Auth type: Buy/Bill PB Units/visits requested: 284mg  x 2 doses Reference number: [id number] Approval from: 09/07/23 to 03/07/24

## 2023-09-10 ENCOUNTER — Other Ambulatory Visit: Payer: Self-pay | Admitting: Medical Genetics

## 2023-09-10 DIAGNOSIS — Z006 Encounter for examination for normal comparison and control in clinical research program: Secondary | ICD-10-CM

## 2023-09-10 NOTE — Telephone Encounter (Signed)
Leqvio injection 09/24/23

## 2023-09-24 ENCOUNTER — Ambulatory Visit: Payer: Medicare HMO

## 2023-09-24 VITALS — BP 131/84 | HR 68 | Temp 97.7°F | Resp 16

## 2023-09-24 DIAGNOSIS — E7800 Pure hypercholesterolemia, unspecified: Secondary | ICD-10-CM

## 2023-09-24 MED ORDER — INCLISIRAN SODIUM 284 MG/1.5ML ~~LOC~~ SOSY
284.0000 mg | PREFILLED_SYRINGE | Freq: Once | SUBCUTANEOUS | Status: AC
  Administered 2023-09-24: 284 mg via SUBCUTANEOUS
  Filled 2023-09-24: qty 1.5

## 2023-09-24 NOTE — Progress Notes (Signed)
Diagnosis: Pure hypercholesterolemia   Provider:  Chilton Greathouse MD  Procedure: Injection  Leqvio (inclisiran), Dose: 284 mg, Site: subcutaneous, Number of injections: 1  Administered in right arm.  Post Care: Observation period completed  Discharge: Condition: Good, Destination: Home . AVS Provided  Performed by:  Wyvonne Lenz, RN

## 2023-10-20 ENCOUNTER — Telehealth: Payer: Self-pay | Admitting: Pharmacy Technician

## 2023-10-20 NOTE — Telephone Encounter (Signed)
Patient has been approved for Leqvio NPAF (Free drug) 09/17/23 - 11/30/24.

## 2023-11-06 DIAGNOSIS — H35372 Puckering of macula, left eye: Secondary | ICD-10-CM | POA: Diagnosis not present

## 2023-11-06 DIAGNOSIS — H10413 Chronic giant papillary conjunctivitis, bilateral: Secondary | ICD-10-CM | POA: Diagnosis not present

## 2023-11-06 DIAGNOSIS — Z961 Presence of intraocular lens: Secondary | ICD-10-CM | POA: Diagnosis not present

## 2023-11-06 DIAGNOSIS — H40013 Open angle with borderline findings, low risk, bilateral: Secondary | ICD-10-CM | POA: Diagnosis not present

## 2023-11-06 DIAGNOSIS — H04123 Dry eye syndrome of bilateral lacrimal glands: Secondary | ICD-10-CM | POA: Diagnosis not present

## 2023-11-06 DIAGNOSIS — H26491 Other secondary cataract, right eye: Secondary | ICD-10-CM | POA: Diagnosis not present

## 2023-11-06 DIAGNOSIS — H0102A Squamous blepharitis right eye, upper and lower eyelids: Secondary | ICD-10-CM | POA: Diagnosis not present

## 2023-11-06 DIAGNOSIS — H469 Unspecified optic neuritis: Secondary | ICD-10-CM | POA: Diagnosis not present

## 2023-11-06 DIAGNOSIS — H0102B Squamous blepharitis left eye, upper and lower eyelids: Secondary | ICD-10-CM | POA: Diagnosis not present

## 2023-12-10 ENCOUNTER — Other Ambulatory Visit: Payer: Self-pay | Admitting: Family

## 2023-12-10 DIAGNOSIS — I25119 Atherosclerotic heart disease of native coronary artery with unspecified angina pectoris: Secondary | ICD-10-CM

## 2023-12-11 ENCOUNTER — Telehealth: Payer: Self-pay

## 2023-12-11 NOTE — Telephone Encounter (Signed)
 Called re: PREP program referral; she and her husband would like to attend class on 1/27, every M/W  12:30-1:45; assessment visits scheduled for both on 1/20 at 9am

## 2023-12-15 ENCOUNTER — Other Ambulatory Visit: Payer: Self-pay | Admitting: Internal Medicine

## 2023-12-15 DIAGNOSIS — E785 Hyperlipidemia, unspecified: Secondary | ICD-10-CM

## 2023-12-23 ENCOUNTER — Emergency Department (HOSPITAL_BASED_OUTPATIENT_CLINIC_OR_DEPARTMENT_OTHER)
Admission: EM | Admit: 2023-12-23 | Discharge: 2023-12-23 | Disposition: A | Discharge status: Home / Self Care | Payer: Medicare HMO | Attending: Emergency Medicine | Admitting: Emergency Medicine

## 2023-12-23 ENCOUNTER — Other Ambulatory Visit: Payer: Self-pay

## 2023-12-23 ENCOUNTER — Telehealth (HOSPITAL_BASED_OUTPATIENT_CLINIC_OR_DEPARTMENT_OTHER): Payer: Self-pay | Admitting: Emergency Medicine

## 2023-12-23 ENCOUNTER — Encounter (HOSPITAL_BASED_OUTPATIENT_CLINIC_OR_DEPARTMENT_OTHER): Payer: Self-pay

## 2023-12-23 ENCOUNTER — Other Ambulatory Visit (HOSPITAL_COMMUNITY): Payer: Self-pay

## 2023-12-23 DIAGNOSIS — J449 Chronic obstructive pulmonary disease, unspecified: Secondary | ICD-10-CM | POA: Insufficient documentation

## 2023-12-23 DIAGNOSIS — I2510 Atherosclerotic heart disease of native coronary artery without angina pectoris: Secondary | ICD-10-CM | POA: Insufficient documentation

## 2023-12-23 DIAGNOSIS — Z7951 Long term (current) use of inhaled steroids: Secondary | ICD-10-CM | POA: Diagnosis not present

## 2023-12-23 DIAGNOSIS — Z91040 Latex allergy status: Secondary | ICD-10-CM | POA: Insufficient documentation

## 2023-12-23 DIAGNOSIS — J019 Acute sinusitis, unspecified: Secondary | ICD-10-CM | POA: Diagnosis not present

## 2023-12-23 DIAGNOSIS — R0981 Nasal congestion: Secondary | ICD-10-CM

## 2023-12-23 MED ORDER — AZITHROMYCIN 250 MG PO TABS: ORAL_TABLET | ORAL | 0 refills | Status: DC

## 2023-12-23 MED ORDER — ONDANSETRON HCL 4 MG PO TABS: 4.0000 mg | ORAL_TABLET | Freq: Three times a day (TID) | ORAL | 0 refills | Status: DC | PRN

## 2023-12-23 MED ORDER — ONDANSETRON HCL 4 MG PO TABS
4.0000 mg | ORAL_TABLET | Freq: Three times a day (TID) | ORAL | 0 refills | Status: DC | PRN
  Filled 2023-12-23: qty 12, 4d supply, fill #0

## 2023-12-23 MED ORDER — AMOXICILLIN-POT CLAVULANATE 875-125 MG PO TABS: 1.0000 | ORAL_TABLET | Freq: Two times a day (BID) | ORAL | 0 refills | Status: DC

## 2023-12-23 NOTE — Progress Notes (Signed)
[facility] PREP Evaluation  Patient Details  Name: Wendy Patrick MRN: [id number] Date of Birth: 08/13/1946 Age: 78 y.o. PCP: Myrlene Broker, MD  Vitals:   12/23/23 0947  BP: 138/68  Pulse: 98  SpO2: 98%  Weight: 117 lb 9.6 oz (53.3 kg)     [facility] Eval - 12/23/23 0900       [facility] "PREP" Location   [facility] "PREP" Location [facility]      Referral    Referring Provider Walker    Reason for referral Inactivity;High Cholesterol    Program Start Date 12/28/23      Measurement   Waist Circumference 28 inches    Hip Circumference 34 inches    Body fat 33.5 percent      Information for Trainer   Goals --   Establish exercise routine/program   Current Exercise none    Pertinent Medical History --   CAD, COPD, high cholesterol, Alz/dementia   Current Barriers Memory loss/hearing loss      Timed Up and Go (TUGS)   Timed Up and Go Low risk <9 seconds      Mobility and Daily Activities   I find it easy to walk up or down two or more flights of stairs. 1    I have no trouble taking out the trash. 4    I do housework such as vacuuming and dusting on my own without difficulty. 3    I can easily lift a gallon of milk (8lbs). 4    I can easily walk a mile. 4    I have no trouble reaching into high cupboards or reaching down to pick up something from the floor. 1    I do not have trouble doing out-door work such as Loss adjuster, chartered, raking leaves, or gardening. 3      Mobility and Daily Activities   I feel younger than my age. 1    I feel independent. 1    I feel energetic. 1    I live an active life.  1    I feel strong. 1    I feel healthy. 1    I feel active as other people my age. 1      How fit and strong are you.   Fit and Strong Total Score 27            Past Medical History:  Diagnosis Date   AIN grade I    COPD (chronic obstructive pulmonary disease) (HCC)    Coronary artery disease 08/2009   40% mid left circ by cath in [place], [place]   Dyspnea    with  heavy exertion   Endometriosis    1986   Endometriosis yrs ago   Family history of adverse reaction to anesthesia    sister ponv   GERD (gastroesophageal reflux disease)    Granuloma annulare    Gum lesion    per pt left jawborn has small knot on gum on back tooth, is not painful or draining   Headache    History of gestational diabetes many  yrs ago   History of hiatal hernia    slight h[facility]   History of kidney stones 12-29-2010   passed on own   History of migraine yrs ago   Hyperlipidemia    Optic neuritis    12-29-13   Optic neuritis Novembr   Trigeminal neuralgia    Urinary urgency    Vaginal anomaly    tears and  bleeds easily   Past Surgical History:  Procedure Laterality Date   ABDOMINAL HYSTERECTOMY  age 78   CARDIAC CATHETERIZATION  2010   COLONOSCOPY  12/2019   3 polyps removed 1 pre cancer   EXCISION OF SKIN TAG N/A 02/14/2021   Procedure: EXCISION OF ANAL LESIONS;  Surgeon: Romie Levee, MD;  Location: [facility];  Service: General;  Laterality: N/A;  45 MIN TOTAL   EYE SURGERY Bilateral 2017   both eyes cataracts lens replacement   LEFT HEART CATH AND CORONARY ANGIOGRAPHY N/A 04/17/2020   Procedure: LEFT HEART CATH AND CORONARY ANGIOGRAPHY;  Surgeon: Tonny Bollman, MD;  Location: [facility] INVASIVE CV LAB;  Service: Cardiovascular;  Laterality: N/A;   MANDIBLE SURGERY     gum tumor removed in [place] not sure if was benign per pt   MINOR HEMORRHOIDECTOMY  yrs ago   RECTAL EXAM UNDER ANESTHESIA N/A 02/14/2021   Procedure: ANAL EXAM UNDER ANESTHESIA;  Surgeon: Romie Levee, MD;  Location: [facility];  Service: General;  Laterality: N/A;   TUBAL LIGATION  yrs ago   UPPER GASTROINTESTINAL ENDOSCOPY  12/2019   Social History   Tobacco Use  Smoking Status Former   Current packs/day: 0.00   Average packs/day: 2.0 packs/day for 40.0 years (80.0 ttl pk-yrs)   Types: Cigarettes   Start date: 02/05/1956   Quit date: 02/05/1996   Years since  quitting: 27.8  Smokeless Tobacco Never  Tobacco Comments   Educated LDCT for lung Ca to discuss with MD if interested  To begin PREP class at [facility] on 12/28/23, every M/W 12:30-1:45 Note: states unable to tolerate Aricept, still struggles w/memory and wt loss; has significant hearing loss too.  [name] 12/23/2023, 9:53 AM

## 2023-12-23 NOTE — ED Provider Notes (Signed)
[place] EMERGENCY DEPARTMENT AT [facility] Provider Note   CSN: [id number] Arrival date & time: 12/23/23  4098     History  Chief Complaint  Patient presents with   Nasal Congestion    Wendy Patrick is a 78 y.o. female.  Patient here with sinus congestion.  History of memory issues.  She has been having sinus congestion for couple days.  Some mucousy discharge.  Denies any fever or chills.  Home remedies have not helped.  Her nose feels very sore.  She denies any chest pain shortness of breath weakness numbness tingling.  Some to blow her nose a lot.  She has history of COPD high cholesterol CAD.  Is not having any nosebleeds.  Not on any blood thinners.  Has not been using any nasal spray.  No sick contacts.  The history is provided by the patient.       Home Medications Prior to Admission medications   Medication Sig Start Date End Date Taking? Authorizing Provider  amoxicillin-clavulanate (AUGMENTIN) 875-125 MG tablet Take 1 tablet by mouth every 12 (twelve) hours. 12/23/23  Yes Esiah Bazinet, DO  Ascorbic Acid (VITAMIN C) 1000 MG tablet Take 1,000 mg by mouth daily.    [provider]  cholecalciferol (VITAMIN D) 1000 units tablet Take 10,000 Units by mouth daily. Vitamin d 3    [provider]  diphenhydrAMINE (BENADRYL) 25 MG tablet Take 25 mg by mouth at bedtime.    [provider]  donepezil (ARICEPT) 5 MG tablet Take 1 tablet (5 mg total) by mouth at bedtime. 07/27/23   Myrlene Broker, MD  Evolocumab (REPATHA SURECLICK) 140 MG/ML SOAJ Inject 140 mg into the skin every 14 (fourteen) days. 01/02/23   Hilty, Lisette Abu, MD  fluticasone (FLONASE) 50 MCG/ACT nasal spray Place 2 sprays into both nostrils daily. 01/26/23   Gwenlyn Fudge, FNP  hydrALAZINE (APRESOLINE) 10 MG tablet Take 1 tablet (10 mg total) by mouth 3 (three) times daily as needed. 02/18/23   Horton, Clabe Seal, DO  ipratropium (ATROVENT) 0.03 % nasal spray Place 2 sprays  into both nostrils every 12 (twelve) hours. 09/29/22   Glenford Bayley, NP  ipratropium-albuterol (DUONEB) 0.5-2.5 (3) MG/3ML SOLN Take 3 mLs by nebulization 3 (three) times daily. 11/10/22   Glenford Bayley, NP  KRILL OIL PO Take 250 mg by mouth daily.    [provider]  levocetirizine (XYZAL) 5 MG tablet Take 1 tablet (5 mg total) by mouth every evening. 01/26/23   Gwenlyn Fudge, FNP  Multiple Vitamins-Minerals (CENTRUM SILVER PO) Take by mouth daily at 2 PM.    [provider]      Allergies    Bevespi aerosphere [glycopyrrolate-formoterol], Atorvastatin, Crestor [rosuvastatin], Duloxetine, Lamotrigine, Latex, and Statins    Review of Systems   Review of Systems  Physical Exam Updated Vital Signs BP (!) 159/70 (BP Location: Left Arm)   Pulse 92   Temp 97.9 F (36.6 C) (Oral)   Resp 17   Ht 5\' 4"  (1.626 m)   Wt 54 kg   SpO2 100%   BMI 20.43 kg/m  Physical Exam Vitals and nursing note reviewed.  Constitutional:      General: She is not in acute distress.    Appearance: She is well-developed. She is not ill-appearing.  HENT:     Head: Normocephalic and atraumatic.     Nose: Congestion present.     Mouth/Throat:     Mouth: Mucous membranes  are moist.     Pharynx: No oropharyngeal exudate or posterior oropharyngeal erythema.  Eyes:     Extraocular Movements: Extraocular movements intact.     Conjunctiva/sclera: Conjunctivae normal.     Pupils: Pupils are equal, round, and reactive to light.  Cardiovascular:     Rate and Rhythm: Normal rate and regular rhythm.     Pulses: Normal pulses.     Heart sounds: Normal heart sounds. No murmur heard. Pulmonary:     Effort: Pulmonary effort is normal. No respiratory distress.     Breath sounds: Normal breath sounds.  Abdominal:     Palpations: Abdomen is soft.     Tenderness: There is no abdominal tenderness.  Musculoskeletal:        General: No swelling.     Cervical back: Normal range of motion and  neck supple.  Skin:    General: Skin is warm and dry.     Capillary Refill: Capillary refill takes less than 2 seconds.  Neurological:     Mental Status: She is alert.  Psychiatric:        Mood and Affect: Mood normal.     ED Results / Procedures / Treatments   Labs (all labs ordered are listed, but only abnormal results are displayed) Labs Reviewed - No data to display  EKG None  Radiology No results found.  Procedures Procedures    Medications Ordered in ED Medications - No data to display  ED Course/ Medical Decision Making/ A&P                                 Medical Decision Making Risk Prescription drug management.   Wendy Patrick is here with nasal congestion.  Normal vitals.  No fever.  Well-appearing.  History of dementia CAD.  She is not on any blood thinners.  No bloody noses.  She has been having more nasal congestion here recently causing her to be frustrated.  She has had some bloody mucousy discharge at times as well.  Differential diagnosis likely viral process versus sinus infection versus allergy/dry air issues.  She clinically looks very well.  She has had symptoms for a while now.  I do not think there is any real indication to do any viral testing as I will likely just treat for sinus infection with Augmentin and nasal saline spray and try to get some humidity in her nose with warm showers and tea and honey.  Overall her exam is unremarkable.  She has no bleeding from her nose or concerning features to her nose.  She was given reassurance recommend follow-up with primary care doctor.  Discharge.  This chart was dictated using voice recognition software.  Despite best efforts to proofread,  errors can occur which can change the documentation meaning.         Final Clinical Impression(s) / ED Diagnoses Final diagnoses:  Nasal congestion  Acute sinusitis, recurrence not specified, unspecified location    Rx / DC Orders ED Discharge Orders           Ordered    amoxicillin-clavulanate (AUGMENTIN) 875-125 MG tablet  Every 12 hours        12/23/23 1006              [place], Madelaine Bhat, DO 12/23/23 1009

## 2023-12-23 NOTE — ED Notes (Signed)
ED Provider at bedside. 

## 2023-12-23 NOTE — ED Triage Notes (Signed)
Patient arrives POV with husband from home c/o cough and nasal congestion. Patient reports "sores in her nose" and that "it's been going on for way too long." Patient reports having to blow nose "all the time." Patient a&o x4; no acute distress during triage.

## 2023-12-23 NOTE — Discharge Instructions (Addendum)
Recommend buying nasal saline spray to use as needed.  Recommend warm showers to help humidify your nose.  Recommend drinking warm tea with honey to also help humidify things in your nose and nasal passages.  Take antibiotic as prescribed to treat for possible sinus infection.  I suspect this is either a sinus infection or viral infection or dry nasal passages from weather changes and dry air.  Follow-up with your primary care doctor.

## 2023-12-28 ENCOUNTER — Ambulatory Visit: Payer: Medicare HMO

## 2023-12-28 VITALS — BP 128/74 | HR 97 | Temp 98.1°F | Resp 16 | Ht 64.0 in | Wt 117.8 lb

## 2023-12-28 DIAGNOSIS — E7800 Pure hypercholesterolemia, unspecified: Secondary | ICD-10-CM | POA: Diagnosis not present

## 2023-12-28 MED ORDER — INCLISIRAN SODIUM 284 MG/1.5ML ~~LOC~~ SOSY
284.0000 mg | PREFILLED_SYRINGE | Freq: Once | SUBCUTANEOUS | Status: AC
  Administered 2023-12-28: 284 mg via SUBCUTANEOUS
  Filled 2023-12-28: qty 1.5

## 2023-12-28 NOTE — Progress Notes (Signed)
Diagnosis: Hyperlipidemia  Provider:  Chilton Greathouse MD  Procedure: Injection  Leqvio (inclisiran), Dose: 284 mg, Site: subcutaneous, Number of injections: 1  Injection Site(s): Left arm  Post Care:  N/A  Discharge: Condition: Good, Destination: Home . AVS Provided  Performed by:  Nat Math, RN

## 2023-12-28 NOTE — Progress Notes (Signed)
[facility] PREP Weekly Session  Patient Details  Name: Wendy Patrick MRN: [id number] Date of Birth: [birth year] Age: 78 y.o. PCP: Myrlene Broker, MD  There were no vitals filed for this visit.   [facility] Weekly seesion - 12/28/23 1500       [facility] "PREP" Location   [facility] "PREP" Location [facility]      Weekly Session   Topic Discussed Goal setting and welcome to the program   Introductions, review of program and notebook; tour of facility; offered option to work out on cardio machines   Classes attended to date 1             [name] 12/28/2023, 3:34 PM

## 2024-01-04 NOTE — Progress Notes (Signed)
[facility] PREP Weekly Session  Patient Details  Name: Wendy Patrick MRN: [id number] Date of Birth: 12-05-1945 Age: 78 y.o. PCP: Wendy Broker, MD  Vitals:   01/04/24 1400  Weight: 114 lb 9.6 oz (52 kg)     [facility] Weekly seesion - 01/04/24 1400       [facility] "PREP" Location   [facility] "PREP" Location [facility]      Weekly Session   Topic Discussed Importance of resistance training;Other ways to be active   Goals: work up to 150 min/cardio each week, strength training 2-3 times/wk for 20-40 mins.   Classes attended to date 3             Mervyn Gay B Sumedha Munnerlyn 01/04/2024, 2:01 PM

## 2024-01-05 ENCOUNTER — Ambulatory Visit: Payer: Medicare HMO | Admitting: Internal Medicine

## 2024-01-11 NOTE — Progress Notes (Signed)
 [facility] PRE[facility] Weekly Session  Patient Details  Name: Wendy Patrick MRN: [id number] Date of Birth: Jun 26, 1946 Age: 78 y.o. PCP: Adelia [place], MD  Vitals:   01/11/24 1402  Weight: 114 lb 3.2 oz (51.8 kg)     [facility] Weekly seesion - 01/11/24 1400       [facility] "PREP" Location   [facility] "PREP" Location [facility]      Weekly Session   Topic Discussed Healthy eating tips   Foods to reduce, foods to increase; eat the rainbow of colors; introduced YUKA app   Minutes exercised this week 50 minutes    Classes attended to date 5             Sherrill Buikema B Sevon Rotert 01/11/2024, 2:02 PM

## 2024-01-18 NOTE — Progress Notes (Signed)
[facility] PREP Weekly Session  Patient Details  Name: Wendy Patrick MRN: [id number] Date of Birth: Nov 15, 1946 Age: 78 y.o. PCP: Myrlene Broker, MD  Vitals:   01/18/24 1401  Weight: 115 lb 6.4 oz (52.3 kg)     [facility] Weekly seesion - 01/18/24 1400       [facility] "PREP" Location   [facility] "PREP" Location [facility]      Weekly Session   Topic Discussed Health habits   added sugars limit to 24gm/day for women, 36gm/day for men; sugar demo; tips for maintaining healthy lifestyle   Classes attended to date 7             Sherman Lipuma B Eloise Picone 01/18/2024, 2:02 PM

## 2024-01-25 NOTE — Progress Notes (Signed)
 [facility] PRE[facility] Weekly Session  Patient Details  Name: SURINA STORTS MRN: [id number] Date of Birth: 30-May-1946 Age: 78 y.o. PCP: Myrlene Broker, MD  Vitals:   01/25/24 1434  Weight: 114 lb (51.7 kg)     [facility] Weekly seesion - 01/25/24 1400       [facility] "PREP" Location   [facility] "PREP" Location [facility]      Weekly Session   Topic Discussed Restaurant Eating   Salt demo; limit intake to 1500-2300mg /day   Minutes exercised this week 90 minutes    Classes attended to date 8             Avamae Dehaan B Cam Harnden 01/25/2024, 2:35 PM

## 2024-01-27 LAB — NMR, LIPOPROFILE
Cholesterol, Total: 195 mg/dL (ref 100–199)
HDL Particle Number: 41.2 umol/L (ref >=30.5)
HDL-C: 66 mg/dL (ref >39)
LDL Particle Number: 1205 nmol/L — ABNORMAL HIGH (ref <1000)
LDL Size: 21.3 nmol (ref >20.5)
LDL-C (NIH Calc): 104 mg/dL — ABNORMAL HIGH (ref 0–99)
LP-IR Score: 35 (ref <=45)
Small LDL Particle Number: 630 nmol/L — ABNORMAL HIGH (ref <=527)
Triglycerides: 145 mg/dL (ref 0–149)

## 2024-01-27 LAB — LIPOPROTEIN A (LPA)

## 2024-02-01 ENCOUNTER — Ambulatory Visit (HOSPITAL_BASED_OUTPATIENT_CLINIC_OR_DEPARTMENT_OTHER): Payer: Medicare HMO | Admitting: Internal Medicine

## 2024-02-01 VITALS — BP 110/68 | HR 92 | Ht 64.0 in | Wt 115.0 lb

## 2024-02-01 DIAGNOSIS — T466X5D Adverse effect of antihyperlipidemic and antiarteriosclerotic drugs, subsequent encounter: Secondary | ICD-10-CM | POA: Diagnosis not present

## 2024-02-01 DIAGNOSIS — I2510 Atherosclerotic heart disease of native coronary artery without angina pectoris: Secondary | ICD-10-CM | POA: Diagnosis not present

## 2024-02-01 DIAGNOSIS — E785 Hyperlipidemia, unspecified: Secondary | ICD-10-CM

## 2024-02-01 DIAGNOSIS — M791 Myalgia, unspecified site: Secondary | ICD-10-CM

## 2024-02-01 MED ORDER — EZETIMIBE 10 MG PO TABS: 10.0000 mg | ORAL_TABLET | Freq: Every day | ORAL | 3 refills | Status: AC

## 2024-02-01 NOTE — Progress Notes (Signed)
 [facility] PREP Weekly Session  Patient Details  Name: Wendy Patrick MRN: [id number] Date of Birth: [birth year] Age: 78 y.o. PCP: Myrlene Broker, MD  Vitals:   02/01/24 1352  Weight: 115 lb (52.2 kg)     [facility] Weekly seesion - 02/01/24 1300       [facility] "PREP" Location   [facility] "PREP" Location [facility]      Weekly Session   Topic Discussed Stress management and problem solving   Importance of sleep, 7-9 hrs/night; OSA and CPAP discussion, finger tip mudra breathwork   Minutes exercised this week 30 minutes    Classes attended to date 10             Graylen Noboa B Rajon Bisig 02/01/2024, 1:52 PM

## 2024-02-01 NOTE — Patient Instructions (Signed)
 Medication Instructions:  START zetia 10mg  daily in addition to current therapies (for cholesterol)  *If you need a refill on your cardiac medications before your next appointment, please call your pharmacy*  Lab Work: FASTING NMR lipoprofile in 3-4 months  FASTING lab work in 12 months -- before next appointment   If you have labs (blood work) drawn today and your tests are completely normal, you will receive your results only by: MyChart Message (if you have MyChart) OR A paper copy in the mail If you have any lab test that is abnormal or we need to change your treatment, we will call you to review the results.  Follow-Up: At [facility], you and your health needs are our priority.  As part of our continuing mission to provide you with exceptional heart care, we have created designated Provider Care Teams.  These Care Teams include your primary Cardiologist (physician) and Advanced Practice Providers (APPs -  Physician Assistants and Nurse Practitioners) who all work together to provide you with the care you need, when you need it.  We recommend signing up for the patient portal called "MyChart".  Sign up information is provided on this After Visit Summary.  MyChart is used to connect with patients for Virtual Visits (Telemedicine).  Patients are able to view lab/test results, encounter notes, upcoming appointments, etc.  Non-urgent messages can be sent to your provider as well.   To learn more about what you can do with MyChart, go to [email or web address].    Your next appointment:   12 months with Gillian Shields NP

## 2024-02-01 NOTE — Progress Notes (Addendum)
 OFFICE NOTE  Chief Complaint:  No complaints  Primary Care Physician: Myrlene Broker, MD  HPI:  Wendy Patrick is a 78 y.o. female with a past medial history significant for mild coronary artery disease with a 40% circumflex stenosis in 2010, COPD, type 2 diabetes, hyperlipidemia and statin intolerance.  She was previously followed by Dr. Mayford Knife however requested to switch to my care since her husband is a patient of mine.  We will try to see them simultaneously.  Recently she has been having some issues with diarrhea.  She is been undergoing treatment with antibiotics and care by Dr. Ewing Schlein.  She feels drained.  She is also congested and having difficulty with allergies and chronic cough.  She reports labile blood pressures from the upper 90s to 140s, but has no history of hypertension and is not on medication.  In fact she takes only aspirin.  She has been previously seen by one of our PharmD's in the lipid clinic to discuss her dyslipidemia and was offered ezetimibe, but did not take the medication due to cost issues.  Currently she reports some discomfort in the left shoulder arm and left back.  This is similar but much less significant to the discomfort she had in 2010.  She says she gets it occasionally.  Is not necessarily worse with exertion or relieved by rest and does not sound particularly anginal to me.  She did have a nuclear stress test last year which was completely normal.  11/10/2018  Wendy Patrick returns today for routine follow-up.  She does have a history of coronary artery disease with some circumflex stenosis of 40% in 2010, type 2 diabetes, hyperlipidemia and statin intolerance with a goal LDL less than 70.  Her main issues have been with some degree of neuropathy for which she is on gabapentin.  She says she is not tolerating this well and feels tired and is having some mood changes related to that.  Unfortunately she has been statin intolerant.  She has been on both  atorvastatin and rosuvastatin which caused leg cramps.  We had discussed PCSK9 inhibitor therapy in the past however had not pursued it.  Her most recent lab work from June 2018 showed total cholesterol 270, HDL 60, LDL 178 and triglycerides 161.  Although her diet sound somewhat atherogenic, there may be a component of FH.   07/26/2019  Wendy Patrick is seen today in follow-up.  She is also been using Repatha.  Overall she seems to be tolerating however she has been having some concerns with runny nose and persistent cough.  This is atypical for the medication and she was concerned though it may be a side effect.  She needs a recheck of her lipids.  She was on gabapentin for neuropathy but this is been discontinued and has been seeing neurologist.  There was concern that she may have some nerve damage from an injection in her teeth that may be related to her cough and some possible difficulty swallowing.  02/22/2020  Wendy Patrick is seen today in follow-up with her husband. Unfortunately she stopped using Repatha. She had had a good response in her LDL cholesterol which went from 171 down to 119. I suspect that she could have FH as there is a strong family history of heart disease in her parents. She has had no cardiac symptoms that I was aware of however recently did have some left-sided chest discomfort. It apparently was pretty persistent and lasted for  couple hours. It was not associated with exertion or relieved by rest however did radiate into her left arm. According to her husband she had had a cardiovascular work-up by Dr. Kirke Corin in the past which showed mild nonobstructive coronary disease with up to about a 40% stenosis. This was in 2010.  04/13/2020  Wendy Patrick is seen today in follow-up.  She underwent CT with FFR for ongoing chest pain on Apr 04, 2020.  I personally reviewed the study and demonstrated at least moderate mixed coronary disease in the circumflex with mild to moderate mixed coronary disease  of the LAD and RCA.  Coronary calcium score was 252, 76 percentile for age and sex matched control.  CT FFR was performed which demonstrated a significant flow-limiting mid to distal circumflex stenosis.  The FFR dropped suddenly from 0.97-0.62 suggesting a focal stenosis.  She had previously had a 40% stenosis in 2010 in this area and I suspect that has progressed.  Unfortunately she has been statin intolerant.  She also had been on Repatha but stopped it due to side effects.  In addition the over read of the CT suggested emphysema as well as subcarinal adenopathy.  She had previously seen Dr. Sherene Sires with pulmonary who performed some chest x-rays and had been treating her for cough which did not improve.  She would like to follow up with a different pulmonologist.  I will refer her.  08/16/2020  Wendy Patrick is seen today in follow-up.  Based on her abnormal CT FFR she underwent definitive left heart catheterization in May 2021.  This demonstrated nonobstructive coronary disease with mild nonobstructive disease of the proximal to mid LAD and mid left circumflex.  LVEDP was normal.  Maximal medical therapy was recommended.  Unfortunate she cannot tolerate Repatha or statins.  LDL remained above target, last assessed in August 2020 at 119.  She is following a Mediterranean diet.  She will need repeat lipids.  She denies any chest pain.  She is working with our pulmonary and was noted to have some subcarinal lymphadenopathy.  She is scheduled to have pulmonary function testing tomorrow and a follow-up with Dr. Chestine Spore.  08/13/2021  Wendy Patrick returns today for follow-up.  She is accompanied by her husband.  She says she is denying any chest pain or shortness of breath.  She does get some left sided pain over the lower ribs which can occur at rest typically sitting in the chair.  Cardiac catheterization last year showed nonobstructive coronary disease however she does have coronary calcification and a very high LDL  cholesterol over 190 as assessed in September 2021.  She is due for repeat lipids.  Pressure is well controlled today.  06/18/2023  Ms. Cannady is seen today in follow-up with her husband.  She says she is going downhill referring to a number of medical issues including trouble with her vision and hearing as well as episodes of diarrhea and instability leading to near falls.  She has been on Repatha but feels it may be worsening her diarrhea.  Her husband who also is on Repatha does not necessarily think that is the case.  Nonetheless she has had a good response to her lipids including a recent lipid profile showing total cholesterol 143, triglycerides 149, HDL 63 and LDL 50.  She is denied any chest pain or shortness of breath.  She did have an evaluation for chest pain earlier this year including stress test which was negative for ischemia.  She also had  an echocardiogram in May 2024 which showed an EF 65 to 70%.  02/01/2024  Ms. Biddy is seen today in follow-up with her husband.  They both started on Leqvio.  She has now had 2 injections and her recent lipid profile shows that she is still above target.  While she was on Repatha, her LDL was 50 however now on Leqvio her LDL is 104 with an LDL particle number of 1205.  Is not clear why her response to the Repatha was better although dietary factors may explain the difference in cholesterol.  PMHx:  Past Medical History:  Diagnosis Date   AIN grade I    COPD (chronic obstructive pulmonary disease) (HCC)    Coronary artery disease 08/2009   40% mid left circ by cath in [place], [place]   Dyspnea    with heavy exertion   Endometriosis    1986   Endometriosis yrs ago   Family history of adverse reaction to anesthesia    sister ponv   GERD (gastroesophageal reflux disease)    Granuloma annulare    Gum lesion    per pt left jawborn has small knot on gum on back tooth, is not painful or draining   Headache    History of gestational diabetes many  yrs ago    History of hiatal hernia    slight hh   History of kidney stones 03/08/2011   passed on own   History of migraine yrs ago   Hyperlipidemia    Optic neuritis    2014/03/07   Optic neuritis Novembr   Trigeminal neuralgia    Urinary urgency    Vaginal anomaly    tears and bleeds easily    Past Surgical History:  Procedure Laterality Date   ABDOMINAL HYSTERECTOMY  age 53   CARDIAC CATHETERIZATION  2010   COLONOSCOPY  12/2019   3 polyps removed 1 pre cancer   EXCISION OF SKIN TAG N/A 02/14/2021   Procedure: EXCISION OF ANAL LESIONS;  Surgeon: Romie Levee, MD;  Location: [facility];  Service: General;  Laterality: N/A;  45 MIN TOTAL   EYE SURGERY Bilateral Mar 07, 2016   both eyes cataracts lens replacement   LEFT HEART CATH AND CORONARY ANGIOGRAPHY N/A 04/17/2020   Procedure: LEFT HEART CATH AND CORONARY ANGIOGRAPHY;  Surgeon: Tonny Bollman, MD;  Location[facility] INVASIVE CV LAB;  Service: Cardiovascular;  Laterality: N/A;   MANDIBLE SURGERY     gum tumor removed in [place] not sure if was benign per pt   MINOR HEMORRHOIDECTOMY  yrs ago   RECTAL EXAM UNDER ANESTHESIA N/A 02/14/2021   Procedure: ANAL EXAM UNDER ANESTHESIA;  Surgeon: Romie Levee, MD;  Location: [facility];  Service: General;  Laterality: N/A;   TUBAL LIGATION  yrs ago   UPPER GASTROINTESTINAL ENDOSCOPY  12/2019    FAMHx:  Family History  Problem Relation Age of Onset   Cancer Mother        Breast    Breast cancer Mother    Stroke Brother        open heart surgery    Colon cancer Neg Hx    Esophageal cancer Neg Hx    Rectal cancer Neg Hx    Stomach cancer Neg Hx     SOCHx:   reports that she quit smoking about 28 years ago. Her smoking use included cigarettes. She started smoking about 68 years ago. She has a 80 pack-year smoking history. She has never used smokeless tobacco.  She reports current alcohol use of about 3.0 standard drinks of alcohol per week. She reports that she does not  use drugs.  ALLERGIES:  Allergies  Allergen Reactions   Bevespi Aerosphere [Glycopyrrolate-Formoterol] Other (See Comments)    Patient felt like her throat was closing up   Atorvastatin Other (See Comments)    myalgia   Crestor [Rosuvastatin] Other (See Comments)    Leg cramps   Duloxetine Diarrhea   Lamotrigine Rash   Latex Rash   Statins Other (See Comments)    Leg cramps    ROS: Pertinent items noted in HPI and remainder of comprehensive ROS otherwise negative.  HOME MEDS: Current Outpatient Medications on File Prior to Visit  Medication Sig Dispense Refill   Ascorbic Acid (VITAMIN C) 1000 MG tablet Take 1,000 mg by mouth daily.     cholecalciferol (VITAMIN D) 1000 units tablet Take 10,000 Units by mouth daily. Vitamin d 3     KRILL OIL PO Take 250 mg by mouth daily.     Multiple Vitamins-Minerals (CENTRUM SILVER PO) Take by mouth daily at 2 PM.     diphenhydrAMINE (BENADRYL) 25 MG tablet Take 25 mg by mouth at bedtime.     donepezil (ARICEPT) 5 MG tablet Take 1 tablet (5 mg total) by mouth at bedtime. 90 tablet 3   fluticasone (FLONASE) 50 MCG/ACT nasal spray Place 2 sprays into both nostrils daily. 16 g 0   hydrALAZINE (APRESOLINE) 10 MG tablet Take 1 tablet (10 mg total) by mouth 3 (three) times daily as needed. 10 tablet 0   ipratropium (ATROVENT) 0.03 % nasal spray Place 2 sprays into both nostrils every 12 (twelve) hours. 30 mL 1   ipratropium-albuterol (DUONEB) 0.5-2.5 (3) MG/3ML SOLN Take 3 mLs by nebulization 3 (three) times daily. 270 mL 11   levocetirizine (XYZAL) 5 MG tablet Take 1 tablet (5 mg total) by mouth every evening. 30 tablet 0   ondansetron (ZOFRAN) 4 MG tablet Take 1 tablet (4 mg total) by mouth every 8 (eight) hours as needed for nausea or vomiting. 12 tablet 0   No current facility-administered medications on file prior to visit.    LABS/IMAGING: No results found for this or any previous visit (from the past 48 hours).  No results found.  LIPID  PANEL:    Component Value Date/Time   CHOL 143 05/25/2023 0803   CHOL 178 04/29/2022 1012   TRIG 149.0 05/25/2023 0803   HDL 63.50 05/25/2023 0803   HDL 69 04/29/2022 1012   CHOLHDL 2 05/25/2023 0803   VLDL 29.8 05/25/2023 0803   LDLCALC 50 05/25/2023 0803   LDLCALC 85 04/29/2022 1012     WEIGHTS: Wt Readings from Last 3 Encounters:  02/01/24 115 lb (52.2 kg)  01/25/24 114 lb (51.7 kg)  01/18/24 115 lb 6.4 oz (52.3 kg)    VITALS: BP 110/68 (BP Location: Right Arm, Patient Position: Sitting, Cuff Size: Normal)   Pulse 92   Ht 5\' 4"  (1.626 m)   Wt 115 lb (52.2 kg)   SpO2 97%   BMI 19.74 kg/m   EXAM: Deferred  EKG: Deferred  ASSESSMENT: History of chest pain-abnormal CT FFR suggesting flow-limiting stenosis of the mid to distal circumflex, however cardiac catheterization in 03/2020 demonstrated mild nonobstructive disease of the mid circumflex and proximal LAD -negative Myoview stress test (01/2022) Mild nonobstructive coronary disease by cath (2010) -40% mid circumflex lesion Dyslipidemia-statin intolerant Emphysema Subcarinal lymphadenopathy  PLAN: 1.   Mrs. Saunders has switched from  Repatha to [facility] although her cholesterol numbers are not as good as they were previously.  She will likely need additional therapy to reach a target LDL less than 70.  I advised adding Zetia to her regimen.  Will plan repeat lipids in 4 to 6 months.  Chrystie Nose, MD, [facility], FACP  [place]  [facility]  Medical Director of the Advanced Lipid Disorders &  Cardiovascular Risk Reduction Clinic Diplomate of the American Board of Clinical Lipidology Attending Cardiologist  Direct Dial: [phone]  Fax: [phone]  Website:  [email or web address] 02/01/2024, 11:57 AM

## 2024-02-08 NOTE — Progress Notes (Signed)
 [facility] PRE[facility] Weekly Session  Patient Details  Name: Wendy Patrick MRN: [id number] Date of Birth: 12-16-1945 Age: 78 y.o. PCP: Myrlene Broker, MD  Vitals:   02/08/24 1328  Weight: 115 lb 3.2 oz (52.3 kg)     [facility] Weekly seesion - 02/08/24 1300       [facility] "PREP" Location   [facility] "PREP" Location [facility]      Weekly Session   Topic Discussed Expectations and non-scale victories   Staying positive; halfway through program, review/revisit/restate goals.   Minutes exercised this week 60 minutes    Classes attended to date 69             Rajvir Ernster B Jerime Arif 02/08/2024, 1:29 PM

## 2024-02-15 NOTE — Progress Notes (Signed)
 [facility] PRE[facility] Weekly Session  Patient Details  Name: Wendy Patrick MRN: [id number] Date of Birth: 13-May-1946 Age: 78 y.o. PCP: Myrlene Broker, MD  Vitals:   02/15/24 1352  Weight: 116 lb (52.6 kg)     [facility] Weekly seesion - 02/15/24 1300       [facility] "PREP" Location   [facility] "PREP[facility] Location [facility]      Weekly Session   Topic Discussed Other   Portion control; visualize your portion size demo; review of [employer] nutrition label   Minutes exercised this week 75 minutes    Classes attended to date 65             Dametrius Sanjuan B Shadia Larose 02/15/2024, 1:52 PM

## 2024-02-18 ENCOUNTER — Encounter: Payer: Self-pay | Admitting: Pulmonary Disease

## 2024-02-21 ENCOUNTER — Encounter (HOSPITAL_BASED_OUTPATIENT_CLINIC_OR_DEPARTMENT_OTHER): Payer: Self-pay | Admitting: Internal Medicine

## 2024-02-22 NOTE — Progress Notes (Signed)
 [facility] PRE[facility] Weekly Session  Patient Details  Name: Wendy Patrick MRN: [id number] Date of Birth: [birth year] Age: 78 y.o. PCP: Myrlene Broker, MD  Vitals:   02/22/24 1359  Weight: 116 lb 3.2 oz (52.7 kg)     [facility] Weekly seesion - 02/22/24 1300       [facility] "PREP" Location   [facility] "PREP[facility] Location [facility]      Weekly Session   Topic Discussed Finding support    Minutes exercised this week 70 minutes    Classes attended to date 45             Meah Jiron B Vere Diantonio 02/22/2024, 1:59 PM

## 2024-02-29 NOTE — Progress Notes (Signed)
 [facility] PRE[facility] Weekly Session  Patient Details  Name: Wendy Patrick MRN: [id number] Date of Birth: 1946-04-28 Age: 78 y.o. PCP: Myrlene Broker, MD  Vitals:   02/29/24 1341  Weight: 116 lb (52.6 kg)     [facility] Weekly seesion - 02/29/24 1300       [facility] "PREP" Location   [facility] "PREP" Location [facility]      Weekly Session   Topic Discussed Calorie breakdown   USDA guidelines for Carbs, fats and proteins; trustworthy supplement organizations; difference between simple and complex carbs   Classes attended to date 80             [name] [name] 02/29/2024, 1:45 PM

## 2024-03-04 ENCOUNTER — Encounter (HOSPITAL_BASED_OUTPATIENT_CLINIC_OR_DEPARTMENT_OTHER): Payer: Self-pay | Admitting: Internal Medicine

## 2024-03-14 ENCOUNTER — Encounter: Payer: Self-pay | Admitting: Internal Medicine

## 2024-03-14 NOTE — Progress Notes (Signed)
 [facility] PREP Weekly Session  Patient Details  Name: Wendy Patrick MRN: [id number] Date of Birth: 04/02/1946 Age: 78 y.o. PCP: Adelia Homestead, MD  Vitals:   03/14/24 1330  Weight: 118 lb 12.8 oz (53.9 kg)     [facility] Weekly seesion - 03/14/24 1300       [facility] "PREP" Location   [facility] "PREP" Location Wendy Patrick      Weekly Session   Topic Discussed Other   fit testing completed; how fit and strong suvey completed; final assessment visit scheduled for Wed; reveiw of goals and activity plan and PREP survey to bring on Wed.   Minutes exercised this week 100 minutes    Classes attended to date 14             Wendy Patrick 03/14/2024, 1:30 PM

## 2024-03-16 NOTE — Progress Notes (Signed)
 [facility] PREP Evaluation  Patient Details  Name: Wendy Patrick MRN: [id number] Date of Birth: 30-Jun-1946 Age: 78 y.o. PCP: Adelia Homestead, MD  Vitals:   03/16/24 1329  BP: 118/60  Pulse: 98  SpO2: 96%  Weight: 117 lb 9.6 oz (53.3 kg)     [facility] Eval - 03/16/24 1300       [facility] "PREP" Location   [facility] "PREP[facility] Location [facility]      Referral    Program Start Date 12/28/23    Program End Date 03/16/24      Measurement   Waist Circumference 28 inches    Waist Circumference End Program 28 inches    Hip Circumference 34 inches    Hip Circumference End Program 34 inches    Body fat 31.8 percent      Mobility and Daily Activities   I find it easy to walk up or down two or more flights of stairs. 4    I have no trouble taking out the trash. 4    I do housework such as vacuuming and dusting on my own without difficulty. 4    I can easily lift a gallon of milk (8lbs). 4    I can easily walk a mile. 1    I have no trouble reaching into high cupboards or reaching down to pick up something from the floor. 2    I do not have trouble doing out-door work such as Loss adjuster, chartered, raking leaves, or gardening. 3      Mobility and Daily Activities   I feel younger than my age. 3    I feel independent. 3    I feel energetic. 3    I live an active life.  3    I feel strong. 3    I feel healthy. 3    I feel active as other people my age. 3      How fit and strong are you.   Fit and Strong Total Score 43            Past Medical History:  Diagnosis Date   AIN grade I    COPD (chronic obstructive pulmonary disease) (HCC)    Coronary artery disease 08/2009   40% mid left circ by cath in [place], [place]   Dyspnea    with heavy exertion   Endometriosis    1986   Endometriosis yrs ago   Family history of adverse reaction to anesthesia    sister ponv   GERD (gastroesophageal reflux disease)    Granuloma annulare    Gum lesion    per pt left jawborn has small knot on gum  on back tooth, is not painful or draining   Headache    History of gestational diabetes many  yrs ago   History of hiatal hernia    slight hh   History of kidney stones 01-Apr-2011   passed on own   History of migraine yrs ago   Hyperlipidemia    Optic neuritis    03/31/14   Optic neuritis Novembr   Trigeminal neuralgia    Urinary urgency    Vaginal anomaly    tears and bleeds easily   Past Surgical History:  Procedure Laterality Date   ABDOMINAL HYSTERECTOMY  age 52   CARDIAC CATHETERIZATION  2010   COLONOSCOPY  12/2019   3 polyps removed 1 pre cancer   EXCISION OF SKIN TAG N/A 02/14/2021   Procedure: EXCISION OF ANAL LESIONS;  Surgeon: Joyce Nixon, MD;  Location: [facility];  Service: General;  Laterality: N/A;  45 MIN TOTAL   EYE SURGERY Bilateral 2017   both eyes cataracts lens replacement   LEFT HEART CATH AND CORONARY ANGIOGRAPHY N/A 04/17/2020   Procedure: LEFT HEART CATH AND CORONARY ANGIOGRAPHY;  Surgeon: Arnoldo Lapping, MD;  Location: [facility] INVASIVE CV LAB;  Service: Cardiovascular;  Laterality: N/A;   MANDIBLE SURGERY     gum tumor removed in [place] not sure if was benign per pt   MINOR HEMORRHOIDECTOMY  yrs ago   RECTAL EXAM UNDER ANESTHESIA N/A 02/14/2021   Procedure: ANAL EXAM UNDER ANESTHESIA;  Surgeon: Joyce Nixon, MD;  Location: [facility];  Service: General;  Laterality: N/A;   TUBAL LIGATION  yrs ago   UPPER GASTROINTESTINAL ENDOSCOPY  12/2019   Social History   Tobacco Use  Smoking Status Former   Current packs/day: 0.00   Average packs/day: 2.0 packs/day for 40.0 years (80.0 ttl pk-yrs)   Types: Cigarettes   Start date: 02/05/1956   Quit date: 02/05/1996   Years since quitting: 28.1  Smokeless Tobacco Never  Tobacco Comments   Educated LDCT for lung Ca to discuss with MD if interested  NO further wt loss How fit and strong survey increased from 27 to 60 Cardio march increased from 159 to 225 Sit to stands increased from  11 to 12 Education sessions completed: 12 Strength training sessions completed: 11    Bion Todorov B Morgen Ritacco 03/16/2024, 1:31 PM

## 2024-04-26 DIAGNOSIS — D1801 Hemangioma of skin and subcutaneous tissue: Secondary | ICD-10-CM | POA: Diagnosis not present

## 2024-04-26 DIAGNOSIS — L739 Follicular disorder, unspecified: Secondary | ICD-10-CM | POA: Diagnosis not present

## 2024-04-26 DIAGNOSIS — L821 Other seborrheic keratosis: Secondary | ICD-10-CM | POA: Diagnosis not present

## 2024-05-03 ENCOUNTER — Ambulatory Visit: Admitting: Primary Care

## 2024-05-03 ENCOUNTER — Encounter: Payer: Self-pay | Admitting: Primary Care

## 2024-05-03 VITALS — BP 129/78 | HR 89 | Temp 97.8°F | Ht 64.0 in | Wt 116.4 lb

## 2024-05-03 DIAGNOSIS — R0982 Postnasal drip: Secondary | ICD-10-CM | POA: Diagnosis not present

## 2024-05-03 DIAGNOSIS — J310 Chronic rhinitis: Secondary | ICD-10-CM | POA: Diagnosis not present

## 2024-05-03 DIAGNOSIS — J9859 Other diseases of mediastinum, not elsewhere classified: Secondary | ICD-10-CM

## 2024-05-03 DIAGNOSIS — J449 Chronic obstructive pulmonary disease, unspecified: Secondary | ICD-10-CM

## 2024-05-03 DIAGNOSIS — R059 Cough, unspecified: Secondary | ICD-10-CM | POA: Diagnosis not present

## 2024-05-03 MED ORDER — UMECLIDINIUM-VILANTEROL 62.5-25 MCG/ACT IN AEPB: 1.0000 | INHALATION_SPRAY | Freq: Every day | RESPIRATORY_TRACT | 11 refills | Status: AC

## 2024-05-03 MED ORDER — AZELASTINE HCL 0.1 % NA SOLN: 1.0000 | Freq: Two times a day (BID) | NASAL | 1 refills | Status: AC

## 2024-05-03 NOTE — Progress Notes (Signed)
 @Patient  ID: Wendy Patrick, female    DOB: Feb 05, 1946, 78 y.o.   MRN: [id number]  No chief complaint on file.   Referring provider: Adelia Homestead, *  HPI: 78 year old female, former smoker (80-pack-year hx).  Past medical history significant for chronic obstructive pulmonary disease, emphysema, subcarinal mass, chronic rhinitis, PND, chronic cough.  Patient of Dr. Diania Fortes.   Previous [facility] pulmonary encounter: 09/29/2022 Patient presents today for follow-up/COPD. She has a chronic cough and dyspnea. Pulmonary function testing showed mild obstruction. She stopped Breztri  d/t throat irritation. She was not using ICS inhaler for awhile d/t potential thrush. She saw ID in early October for evaluation oral candidiasis and symptoms were not felt to be d/t thrush. She has since restarted Anoro but states that the inhaler is too expensive. She has 2 month supply of medication. CT chest in May 2023 showed severe upper lobe predominant emphysematous, subpleural scarring at lung bases.  12/05/2022 Patient presents today for acute OV She has been having coughing fits last several months, worse after using nebulizer She has clearing her throat  Trouble catching her breath at times d.t cough Scant mucus production. She had one occurrence where she coughed up a spec of blood  She was on Anoro which helped cough but can not afford inhaler CT chest in May 2023 showed emphysema and scarring   05/03/2024- Interim hx  Discussed the use of AI scribe software for clinical note transcription with the patient, who gave verbal consent to proceed.  Wendy Patrick is a 78 year old female with COPD and chronic rhinitis who presents with a cough and respiratory symptoms.  Patient presents today for an overdue 1 year follow-up for COPD/emphysema, chronic rhinitis/PND and subcarinal mass. Patient of Dr. Diania Fortes.   She has a history of COPD and chronic rhinitis, characterized by postnasal drip, throat clearing, and  coughing fits lasting a couple of months, often leading to difficulty catching her breath. She was previously on Anoro, which alleviated her symptoms, but discontinued it due to cost.   Approximately four to five days ago, she developed a cough and respiratory symptoms after exposure to someone with a similar condition. Initially, the cough was productive with mucus but has since become dry. She has used over-the-counter Mucinex , which relieved congestion. No wheezing, fever, chills, or sweats. She experiences shortness of breath, postnasal drip, and a runny nose.  She has not been using nasal sprays or medications like Flonase  or Xyzal  recently. She was doing well until the recent onset of symptoms.  CT chest in May 2023 showed severe upper lobe predominant emphysematous changed, subpleural scarring at lung bases. 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 CXR in January 2024 showed clear lungs, no active cardiopulmonary disease.   Allergies  Allergen Reactions   Bevespi  Aerosphere [Glycopyrrolate-Formoterol] Other (See Comments)    Patient felt like her throat was closing up   Zetia  Dar.Crutch ] Other (See Comments)    Dizziness, balance issues   Atorvastatin Other (See Comments)    myalgia   Crestor [Rosuvastatin] Other (See Comments)    Leg cramps   Duloxetine  Diarrhea   Lamotrigine  Rash   Latex Rash   Statins Other (See Comments)    Leg cramps    Immunization History  Administered Date(s) Administered   PNEUMOCOCCAL CONJUGATE-20 10/02/2022   Pneumococcal Conjugate-13 06/05/2016   Pneumococcal Polysaccharide-23 12/01/2009, 08/02/2019, 08/22/2019   Tdap 12/01/2010   Zoster, Live 12/02/2011  Past Medical History:  Diagnosis Date   AIN grade I    COPD (chronic obstructive pulmonary disease) (HCC)    Coronary artery disease 08/2009   40% mid left circ by cath in [place], [place]   Dyspnea    with heavy exertion    Endometriosis    1986   Endometriosis yrs ago   Family history of adverse reaction to anesthesia    sister ponv   GERD (gastroesophageal reflux disease)    Granuloma annulare    Gum lesion    per pt left jawborn has small knot on gum on back tooth, is not painful or draining   Headache    History of gestational diabetes many  yrs ago   History of hiatal hernia    [facility]light [facility]   History of kidney stones 05-16-2011   passed on own   History of migraine yrs ago   Hyperlipidemia    Optic neuritis    15-May-2014   Optic neuritis Novembr   Trigeminal neuralgia    Urinary urgency    Vaginal anomaly    tears and bleeds easily    Tobacco History: Social History   Tobacco Use  Smoking Status Former   Current packs/day: 0.00   Average packs/day: 2.0 packs/day for 40.0 years (80.0 ttl pk-yrs)   Types: Cigarettes   Start date: 02/05/1956   Quit date: 02/05/1996   Years since quitting: 28.2  Smokeless Tobacco Never  Tobacco Comments   Educated LDCT for lung Ca to discuss with MD if interested   Counseling given: Not Answered Tobacco comments: Educated LDCT for lung Ca to discuss with MD if interested   Outpatient Medications Prior to Visit  Medication Sig Dispense Refill   Ascorbic Acid (VITAMIN C) 1000 MG tablet Take 1,000 mg by mouth daily.     cholecalciferol (VITAMIN D ) 1000 units tablet Take 10,000 Units by mouth daily. Vitamin d  3     diphenhydrAMINE (BENADRYL) 25 MG tablet Take 25 mg by mouth at bedtime.     donepezil  (ARICEPT ) 5 MG tablet Take 1 tablet (5 mg total) by mouth at bedtime. 90 tablet 3   ezetimibe  (ZETIA ) 10 MG tablet Take 1 tablet (10 mg total) by mouth daily. 90 tablet 3   fluticasone  (FLONASE ) 50 MCG/ACT nasal spray Place 2 sprays into both nostrils daily. 16 g 0   hydrALAZINE  (APRESOLINE ) 10 MG tablet Take 1 tablet (10 mg total) by mouth 3 (three) times daily as needed. 10 tablet 0   ipratropium (ATROVENT ) 0.03 % nasal spray Place 2 sprays into both nostrils every 12  (twelve) hours. 30 mL 1   ipratropium-albuterol  (DUONEB) 0.5-2.5 (3) MG/3ML SOLN Take 3 mLs by nebulization 3 (three) times daily. 270 mL 11   KRILL OIL PO Take 250 mg by mouth daily.     levocetirizine (XYZAL ) 5 MG tablet Take 1 tablet (5 mg total) by mouth every evening. 30 tablet 0   Multiple Vitamins-Minerals (CENTRUM SILVER PO) Take by mouth daily at 2 PM.     ondansetron  (ZOFRAN ) 4 MG tablet Take 1 tablet (4 mg total) by mouth every 8 (eight) hours as needed for nausea or vomiting. 12 tablet 0   No facility-administered medications prior to visit.      Review of Systems  Review of Systems  Constitutional: Negative.   HENT:  Positive for congestion and postnasal drip.   Respiratory:  Positive for cough and shortness of breath.   Cardiovascular: Negative.  Physical Exam  There were no vitals taken for this visit. Physical Exam Constitutional:      Appearance: Normal appearance.  HENT:     Head: Normocephalic and atraumatic.     Mouth/Throat:     Mouth: Mucous membranes are moist.     Pharynx: Oropharynx is clear.  Cardiovascular:     Rate and Rhythm: Normal rate.  Pulmonary:     Breath sounds: Normal breath sounds. No wheezing, rhonchi or rales.  Skin:    General: Skin is warm and dry.  Neurological:     General: No focal deficit present.     Mental Status: She is alert and oriented to person, place, and time. Mental status is at baseline.  Psychiatric:        Mood and Affect: Mood normal.        Behavior: Behavior normal.        Thought Content: Thought content normal.        Judgment: Judgment normal.      Lab Results:  CBC    Component Value Date/Time   WBC 6.1 05/25/2023 0803   RBC 5.30 (H) 05/25/2023 0803   HGB 14.8 05/25/2023 0803   HGB 14.3 04/13/2020 1047   HCT 46.0 05/25/2023 0803   HCT 44.5 04/13/2020 1047   PLT 345.0 05/25/2023 0803   PLT 375 04/13/2020 1047   MCV 86.8 05/25/2023 0803   MCV 84 04/13/2020 1047   MCH 26.9 04/13/2020  1047   MCH 28.3 10/10/2014 0834   MCHC 32.2 05/25/2023 0803   RDW 14.3 05/25/2023 0803   RDW 12.7 04/13/2020 1047   LYMPHSABS 1.6 03/08/2018 0932   MONOABS 0.7 03/08/2018 0932   EOSABS 0.2 03/08/2018 0932   BASOSABS 0.0 03/08/2018 0932    BMET    Component Value Date/Time   NA 139 05/25/2023 0803   NA 141 04/13/2020 1047   K 3.9 05/25/2023 0803   CL 103 05/25/2023 0803   CO2 29 05/25/2023 0803   GLUCOSE 99 05/25/2023 0803   BUN 12 05/25/2023 0803   BUN 15 04/13/2020 1047   CREATININE 1.04 05/25/2023 0803   CALCIUM 9.5 05/25/2023 0803   GFRNONAA 57 (L) 04/13/2020 1047   GFRAA 65 04/13/2020 1047    BNP No results found for: "BNP"  ProBNP No results found for: "PROBNP"  Imaging: No results found.   Assessment & Plan:   1. Chronic obstructive pulmonary disease, unspecified COPD type (HCC) (Primary)  2. Mediastinal mass   Assessment and Plan    Chronic Obstructive Pulmonary Disease (COPD) Chronic COPD characterized by coughing fits and occasional dyspnea. Previously managed with Anoro, which was effective but unaffordable. Mild exacerbation likely due to recent respiratory infection and being off LABA/LAMA. No wheezing on examination. Lungs are clear. - No indication for abx or steriods - Refill Anoro prescription and provide sample if available - Instruct her to inform if Anoro is not covered by insurance - Advise continuation of Mucinex  or Robitussin for cough management - Reassess in 5-7 days if symptoms persist; consider antibiotic or chest x-ray if no improvement  Cough Acute cough likely secondary to recent respiratory infection. Initially productive, now dry. No fever, chills, or wheezing. Lungs are clear on examination. 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

## 2024-05-03 NOTE — Patient Instructions (Addendum)
  VISIT SUMMARY: Today, you were seen for a cough and respiratory symptoms. You have a history of COPD and chronic rhinitis, and you recently experienced a worsening of your symptoms after being exposed to someone with a similar condition. Your cough started as productive but has now become dry. You have also experienced shortness of breath, postnasal drip, and a runny nose. We discussed your current medications and made some adjustments to help manage your symptoms.  YOUR PLAN: -CHRONIC OBSTRUCTIVE PULMONARY DISEASE (COPD): COPD is a chronic lung condition that makes it hard to breathe. Your COPD has worsened recently, likely due to a respiratory infection. We will refill your Anoro prescription and provide a sample if available. Please inform us  if Anoro is not covered by your insurance. Continue using Mucinex  or Robitussin for your cough. If your symptoms do not improve in 5-7 days, we may consider an antibiotic or a chest x-ray.  -COUGH: Your cough is likely due to a recent respiratory infection. It started with mucus but is now dry. Continue using Mucinex  or Robitussin to relieve your cough. If there is no improvement in 5-7 days, please let us  know.  -CHRONIC RHINITIS WITH POSTNASAL DRIP: Chronic rhinitis is a condition where your nasal passages are inflamed, causing a runny nose and postnasal drip. This can contribute to your cough. We will prescribe Astelin  nasal spray to help manage your symptoms. Please use the nasal spray as directed to alleviate your symptoms.  INSTRUCTIONS: Please follow up in 5-7 days if your symptoms do not improve. If Anoro is not covered by your insurance, inform us  immediately. Continue using Mucinex  or Robitussin for your cough and start using Astelin  nasal spray as prescribed.  Follow-up 6 months with Dr. Diania Fortes or sooner if needed

## 2024-05-18 ENCOUNTER — Other Ambulatory Visit

## 2024-05-18 DIAGNOSIS — Z006 Encounter for examination for normal comparison and control in clinical research program: Secondary | ICD-10-CM

## 2024-05-27 LAB — GENECONNECT MOLECULAR SCREEN: Genetic Analysis Overall Interpretation: NEGATIVE

## 2024-06-06 ENCOUNTER — Telehealth: Payer: Self-pay | Admitting: Pharmacy Technician

## 2024-06-06 NOTE — Telephone Encounter (Signed)
 Auth Submission:APPROVED Site of care: Site of care: [facility] Payer: [employer] MEDICARE Medication & CPT/J Code(s) submitted: Leqvio  (Inclisiran) J1306 Diagnosis Code:  Route of submission (phone, fax, portal): PORTAL Phone # Fax # Auth type: Buy/Bill PB Units/visits requested: 284MG  X2 DOSES Reference number: [id number] Approval from: 06/06/24 - 06/06/25

## 2024-06-07 ENCOUNTER — Encounter: Payer: Self-pay | Admitting: Internal Medicine

## 2024-06-17 ENCOUNTER — Emergency Department (HOSPITAL_COMMUNITY)

## 2024-06-17 ENCOUNTER — Encounter (HOSPITAL_COMMUNITY): Payer: Self-pay | Admitting: Emergency Medicine

## 2024-06-17 ENCOUNTER — Emergency Department (HOSPITAL_COMMUNITY)
Admission: EM | Admit: 2024-06-17 | Discharge: 2024-06-18 | Disposition: A | Discharge status: Home / Self Care | Attending: Emergency Medicine | Admitting: Emergency Medicine

## 2024-06-17 ENCOUNTER — Other Ambulatory Visit: Payer: Self-pay

## 2024-06-17 DIAGNOSIS — M533 Sacrococcygeal disorders, not elsewhere classified: Secondary | ICD-10-CM | POA: Diagnosis not present

## 2024-06-17 DIAGNOSIS — Z7951 Long term (current) use of inhaled steroids: Secondary | ICD-10-CM | POA: Insufficient documentation

## 2024-06-17 DIAGNOSIS — F10921 Alcohol use, unspecified with intoxication delirium: Secondary | ICD-10-CM | POA: Insufficient documentation

## 2024-06-17 DIAGNOSIS — Y907 Blood alcohol level of 200-239 mg/100 ml: Secondary | ICD-10-CM | POA: Insufficient documentation

## 2024-06-17 DIAGNOSIS — R918 Other nonspecific abnormal finding of lung field: Secondary | ICD-10-CM | POA: Diagnosis not present

## 2024-06-17 DIAGNOSIS — M51369 Other intervertebral disc degeneration, lumbar region without mention of lumbar back pain or lower extremity pain: Secondary | ICD-10-CM | POA: Diagnosis not present

## 2024-06-17 DIAGNOSIS — R9431 Abnormal electrocardiogram [ECG] [EKG]: Secondary | ICD-10-CM | POA: Diagnosis not present

## 2024-06-17 DIAGNOSIS — Z91040 Latex allergy status: Secondary | ICD-10-CM | POA: Insufficient documentation

## 2024-06-17 DIAGNOSIS — F10121 Alcohol abuse with intoxication delirium: Secondary | ICD-10-CM | POA: Diagnosis not present

## 2024-06-17 DIAGNOSIS — R4182 Altered mental status, unspecified: Secondary | ICD-10-CM | POA: Diagnosis not present

## 2024-06-17 DIAGNOSIS — R059 Cough, unspecified: Secondary | ICD-10-CM | POA: Diagnosis not present

## 2024-06-17 DIAGNOSIS — J439 Emphysema, unspecified: Secondary | ICD-10-CM | POA: Diagnosis not present

## 2024-06-17 DIAGNOSIS — I6782 Cerebral ischemia: Secondary | ICD-10-CM | POA: Diagnosis not present

## 2024-06-17 DIAGNOSIS — I10 Essential (primary) hypertension: Secondary | ICD-10-CM | POA: Diagnosis not present

## 2024-06-17 DIAGNOSIS — I700 Atherosclerosis of aorta: Secondary | ICD-10-CM | POA: Diagnosis not present

## 2024-06-17 DIAGNOSIS — J449 Chronic obstructive pulmonary disease, unspecified: Secondary | ICD-10-CM | POA: Insufficient documentation

## 2024-06-17 DIAGNOSIS — R410 Disorientation, unspecified: Secondary | ICD-10-CM | POA: Diagnosis not present

## 2024-06-17 DIAGNOSIS — I959 Hypotension, unspecified: Secondary | ICD-10-CM | POA: Diagnosis not present

## 2024-06-17 LAB — CBC WITH DIFFERENTIAL/PLATELET
Abs Immature Granulocytes: 0.01 K/uL (ref 0.00–0.07)
Basophils Absolute: 0.1 K/uL (ref 0.0–0.1)
Basophils Relative: 2 %
Eosinophils Absolute: 0.3 K/uL (ref 0.0–0.5)
Eosinophils Relative: 5 %
HCT: 41.6 % (ref 36.0–46.0)
Hemoglobin: 13.3 g/dL (ref 12.0–15.0)
Immature Granulocytes: 0 %
Lymphocytes Relative: 32 %
Lymphs Abs: 1.7 K/uL (ref 0.7–4.0)
MCH: 28.2 pg (ref 26.0–34.0)
MCHC: 32 g/dL (ref 30.0–36.0)
MCV: 88.3 fL (ref 80.0–100.0)
Monocytes Absolute: 0.6 K/uL (ref 0.1–1.0)
Monocytes Relative: 11 %
Neutro Abs: 2.7 K/uL (ref 1.7–7.7)
Neutrophils Relative %: 50 %
Platelets: 280 K/uL (ref 150–400)
RBC: 4.71 MIL/uL (ref 3.87–5.11)
RDW: 14.7 % (ref 11.5–15.5)
WBC: 5.3 K/uL (ref 4.0–10.5)
nRBC: 0 % (ref 0.0–0.2)

## 2024-06-17 LAB — URINALYSIS, ROUTINE W REFLEX MICROSCOPIC
Bilirubin Urine: NEGATIVE
Glucose, UA: NEGATIVE mg/dL
Hgb urine dipstick: NEGATIVE
Ketones, ur: NEGATIVE mg/dL
Nitrite: NEGATIVE
Protein, ur: NEGATIVE mg/dL
Specific Gravity, Urine: 1.006 (ref 1.005–1.030)
pH: 6 (ref 5.0–8.0)

## 2024-06-17 LAB — COMPREHENSIVE METABOLIC PANEL WITH GFR
ALT: 21 U/L (ref 0–44)
AST: 30 U/L (ref 15–41)
Albumin: 3.5 g/dL (ref 3.5–5.0)
Alkaline Phosphatase: 67 U/L (ref 38–126)
Anion gap: 13 (ref 5–15)
BUN: 20 mg/dL (ref 8–23)
CO2: 22 mmol/L (ref 22–32)
Calcium: 8.5 mg/dL — ABNORMAL LOW (ref 8.9–10.3)
Chloride: 105 mmol/L (ref 98–111)
Creatinine, Ser: 0.82 mg/dL (ref 0.44–1.00)
GFR, Estimated: >60 mL/min (ref >60)
Glucose, Bld: 103 mg/dL — ABNORMAL HIGH (ref 70–99)
Potassium: 3.8 mmol/L (ref 3.5–5.1)
Sodium: 140 mmol/L (ref 135–145)
Total Bilirubin: 0.2 mg/dL (ref 0.0–1.2)
Total Protein: 6.2 g/dL — ABNORMAL LOW (ref 6.5–8.1)

## 2024-06-17 LAB — ETHANOL: Alcohol, Ethyl (B): 215 mg/dL — ABNORMAL HIGH (ref <15)

## 2024-06-17 LAB — RAPID URINE DRUG SCREEN, HOSP PERFORMED
Amphetamines: NOT DETECTED
Barbiturates: NOT DETECTED
Benzodiazepines: NOT DETECTED
Cocaine: NOT DETECTED
Opiates: NOT DETECTED
Tetrahydrocannabinol: NOT DETECTED

## 2024-06-17 MED ORDER — HALOPERIDOL LACTATE 5 MG/ML IJ SOLN
INTRAMUSCULAR | Status: AC
  Administered 2024-06-17: 5 mg via INTRAVENOUS
  Filled 2024-06-17: qty 1

## 2024-06-17 MED ORDER — MIDAZOLAM HCL 2 MG/2ML IJ SOLN
INTRAMUSCULAR | Status: AC
  Administered 2024-06-17: 2 mg via INTRAVENOUS
  Filled 2024-06-17: qty 2

## 2024-06-17 MED ORDER — MIDAZOLAM HCL 2 MG/2ML IJ SOLN
2.0000 mg | Freq: Once | INTRAMUSCULAR | Status: AC
  Administered 2024-06-17: 2 mg via INTRAVENOUS
  Filled 2024-06-17: qty 2

## 2024-06-17 MED ORDER — HALOPERIDOL LACTATE 5 MG/ML IJ SOLN
5.0000 mg | Freq: Once | INTRAMUSCULAR | Status: AC
  Administered 2024-06-17: 5 mg via INTRAVENOUS
  Filled 2024-06-17: qty 1

## 2024-06-17 MED ORDER — DIPHENHYDRAMINE HCL 50 MG/ML IJ SOLN
INTRAMUSCULAR | Status: AC
  Administered 2024-06-17: 12.5 mg via INTRAVENOUS
  Filled 2024-06-17: qty 1

## 2024-06-17 MED ORDER — HALOPERIDOL LACTATE 5 MG/ML IJ SOLN: 5.0000 mg | Freq: Once | INTRAMUSCULAR | Status: AC

## 2024-06-17 MED ORDER — DIPHENHYDRAMINE HCL 50 MG/ML IJ SOLN: 12.5000 mg | Freq: Once | INTRAMUSCULAR | Status: AC

## 2024-06-17 MED ORDER — MIDAZOLAM HCL 2 MG/2ML IJ SOLN: 2.0000 mg | Freq: Once | INTRAMUSCULAR | Status: AC

## 2024-06-17 NOTE — ED Triage Notes (Signed)
 Pt  arrive by EMS from home for c/o AMS for the past 2 hours. Per husband ETOH on board. Pt agitated on arrival to ED was placed on soft restrain by EMS. C- collar applied by ems pta and pt removed them. Skin tears noticed on  bilateral wrist on arrival to ED due to pt continue fighting on soft restrain. Chemical restrain given on arrival per Dr. Emil order.

## 2024-06-17 NOTE — ED Provider Notes (Signed)
 [facility] Provider Note   CSN: [id number] Arrival date & time: 06/17/24  1857     Patient presents with: Altered Mental Status   Wendy Patrick is a 78 y.o. female.   78 yo F with a chief complaints of altered mental status.  Has reportedly been yelling and screaming for the past couple hours at home.  Husband told EMS that he thought that she had maybe drank more wine than she normally did and he could not get her calm down and so called EMS.  I am unable to get any history from the patient.  She continues to yell and scream and does not answer any direct questions   Altered Mental Status      Prior to Admission medications   Medication Sig Start Date End Date Taking? Authorizing Provider  azelastine  (ASTELIN ) 0.1 % nasal spray Place 1 spray into both nostrils 2 (two) times daily. Use in each nostril as directed Patient taking differently: Place 1 spray into both nostrils 2 (two) times daily as needed for rhinitis or allergies. 05/03/24  Yes Hope Almarie ORN, NP  fluticasone  (FLONASE ) 50 MCG/ACT nasal spray Place 2 sprays into both nostrils daily. Patient taking differently: Place 2 sprays into both nostrils daily as needed for allergies or rhinitis. 01/26/23  Yes Merlynn Eland F, FNP  KRILL OIL PO Take 250 mg by mouth daily.   Yes [provider]  Multiple Vitamins-Minerals (CENTRUM SILVER PO) Take 1 tablet by mouth daily with breakfast.   Yes [provider]  umeclidinium-vilanterol (ANORO ELLIPTA ) 62.5-25 MCG/ACT AEPB Inhale 1 puff into the lungs daily. 05/03/24  Yes Hope Almarie ORN, NP  XYZAL  ALLERGY  24HR 5 MG tablet Take 5 mg by mouth in the morning.   Yes [provider]  ezetimibe  (ZETIA ) 10 MG tablet Take 1 tablet (10 mg total) by mouth daily. 02/01/24 06/17/24  Mona Vinie BROCKS, MD    Allergies: Bevespi  aerosphere [glycopyrrolate-formoterol], Zetia  [ezetimibe ], Atorvastatin, Crestor [rosuvastatin],  Duloxetine , Lamotrigine , Latex, and Statins    Review of Systems  Updated Vital Signs BP 138/88   Pulse 89   Temp 98 F (36.7 C) (Oral)   Resp 19   Ht 5' 4 (1.626 m)   Wt 53 kg   SpO2 96%   BMI 20.06 kg/m   Physical Exam Vitals and nursing note reviewed.  Constitutional:      General: She is not in acute distress.    Appearance: She is well-developed. She is not diaphoretic.  HENT:     Head: Normocephalic and atraumatic.  Eyes:     Pupils: Pupils are equal, round, and reactive to light.  Cardiovascular:     Rate and Rhythm: Normal rate and regular rhythm.     Heart sounds: No murmur heard.    No friction rub. No gallop.  Pulmonary:     Effort: Pulmonary effort is normal.     Breath sounds: No wheezing or rales.  Abdominal:     General: There is no distension.     Palpations: Abdomen is soft.     Tenderness: There is no abdominal tenderness.  Musculoskeletal:        General: No tenderness.     Cervical back: Normal range of motion and neck supple.  Skin:    General: Skin is warm and dry.  Neurological:     Mental Status: She is alert.     Comments: Patient is yelling and screaming and is  trying to hit nursing staff.  Psychiatric:        Behavior: Behavior is agitated, aggressive and combative.     (all labs ordered are listed, but only abnormal results are displayed) Labs Reviewed  COMPREHENSIVE METABOLIC PANEL WITH GFR - Abnormal; Notable for the following components:      Result Value   Glucose, Bld 103 (*)    Calcium 8.5 (*)    Total Protein 6.2 (*)    All other components within normal limits  ETHANOL - Abnormal; Notable for the following components:   Alcohol, Ethyl (B) 215 (*)    All other components within normal limits  URINALYSIS, ROUTINE W REFLEX MICROSCOPIC - Abnormal; Notable for the following components:   Color, Urine STRAW (*)    Leukocytes,Ua MODERATE (*)    Bacteria, UA RARE (*)    All other components within normal limits  CBC WITH  DIFFERENTIAL/PLATELET  RAPID URINE DRUG SCREEN, HOSP PERFORMED    EKG: EKG Interpretation Date/Time:  Friday June 17 2024 19:18:28 EDT Ventricular Rate:  89 PR Interval:  155 QRS Duration:  78 QT Interval:  377 QTC Calculation: 459 R Axis:   85  Text Interpretation: Sinus rhythm Borderline right axis deviation Nonspecific T abnormalities, lateral leads wandering baseline TECHNICALLY DIFFICULT Otherwise no significant change Confirmed by Emil Share [phone]) on 06/17/2024 8:12:26 PM  Radiology: CT Head Wo Contrast Result Date: 06/17/2024 CLINICAL DATA:  Mental status change EXAM: CT HEAD WITHOUT CONTRAST TECHNIQUE: Contiguous axial images were obtained from the base of the skull through the vertex without intravenous contrast. RADIATION DOSE REDUCTION: This exam was performed according to the departmental dose-optimization program which includes automated exposure control, adjustment of the mA and/or kV according to patient size and/or use of iterative reconstruction technique. COMPARISON:  MRI 07/05/2023, FINDINGS: Brain: No acute territorial infarction, hemorrhage, or intracranial mass. Chronic small vessel ischemic changes of the white matter. The ventricles are nonenlarged. Mild atrophy Vascular: No hyperdense vessels. Mild carotid vascular calcification Skull: Normal. Negative for fracture or focal lesion. Sinuses/Orbits: No acute finding. Other: None IMPRESSION: 1. No CT evidence for acute intracranial abnormality. 2. Atrophy and chronic small vessel ischemic changes of the white matter. Electronically Signed   By: Luke Bun M.D.   On: 06/17/2024 22:03   DG Chest Port 1 View Result Date: 06/17/2024 CLINICAL DATA:  cough EXAM: PORTABLE CHEST - 1 VIEW COMPARISON:  December 05, 2022 FINDINGS: Azygous lobe fissure. Emphysema. Patchy opacities in the left lung base. No pleural effusion or pneumothorax. No cardiomegaly. Aortic atherosclerosis. No acute fracture or destructive lesions. Multilevel  thoracic osteophytosis. IMPRESSION: Emphysema. Patchy opacities in the left lung base, which may represent atelectasis or developing bronchopneumonia, in the correct clinical context. Electronically Signed   By: Rogelia Myers M.D.   On: 06/17/2024 19:30     Procedures   Medications Ordered in the ED  haloperidol lactate (HALDOL) injection 5 mg (5 mg Intravenous Given 06/17/24 1935)  midazolam  (VERSED ) injection 2 mg (2 mg Intravenous Given 06/17/24 1934)  diphenhydrAMINE (BENADRYL) injection 12.5 mg (12.5 mg Intravenous Given 06/17/24 1934)  haloperidol lactate (HALDOL) injection 5 mg (5 mg Intravenous Given 06/17/24 2051)  midazolam  (VERSED ) injection 2 mg (2 mg Intravenous Given 06/17/24 2132)                                    Medical Decision Making Amount and/or Complexity of Data Reviewed  Labs: ordered. Radiology: ordered.  Risk Prescription drug management.   78 yO F with a chief complaints of change in mental status.  The patient for the past 2 hours has reportedly been inconsolable and has been walking around and yelling and screaming at home.  EMS brought her here, since she has arrived she continues to yell and scream and try to hit nursing staff.  Per EMS the husband thinks that she drank much more than she normally does at home.  They tell me she normally has a couple alcoholic drinks a day and finished a whole bottle of wine.  Exam unable to obtain history will obtain laboratory evaluation CT head and chest x-ray.  Patient is unable to follow directions and is a danger to herself and staff.  Will chemically sedate.    Husband is arrived and provides further history.  Tells me that they have been going through a bit of a rough patch.  He said that over the past couple years they have had troubles off and on.  He said for the past couple weeks she has been increasingly angry with him mostly at nights and they have been having arguments until about midnight.  He does think  that she drank a bit more than she normally does.  She did try to go to the bathroom and lost her balance and fell.  He thinks that she struck her head against the wall.  She continued to argue with him and eventually EMS was called.  He denies any recent illness.  States that she has had some progressive issues with cognition over the past few years.  Has been increasingly forgetful repeats questions often sometimes stops at stop signs and forgets where she is going.  No significant electrolyte abnormalities.  Alcohol level is elevated to 215.  UA negative for infection on my independent interpretation.  Chest x-ray consistent with COPD on my independent to rotation.  Radiology read with concern for possible pneumonia.  Awaiting CT of the head.  Likely plan for metabolize.  Ct head negative for ICH.    Signed out to Dr. Bari, please see their note for further details of care in the ED.  The patients results and plan were reviewed and discussed.   Any x-rays performed were independently reviewed by myself.   Differential diagnosis were considered with the presenting HPI.  Medications  haloperidol lactate (HALDOL) injection 5 mg (5 mg Intravenous Given 06/17/24 1935)  midazolam  (VERSED ) injection 2 mg (2 mg Intravenous Given 06/17/24 1934)  diphenhydrAMINE (BENADRYL) injection 12.5 mg (12.5 mg Intravenous Given 06/17/24 1934)  haloperidol lactate (HALDOL) injection 5 mg (5 mg Intravenous Given 06/17/24 2051)  midazolam  (VERSED ) injection 2 mg (2 mg Intravenous Given 06/17/24 2132)    Vitals:   06/17/24 1919 06/17/24 1924 06/17/24 2100 06/17/24 2300  BP:    138/88  Pulse:    89  Resp:   (!) 22 19  Temp:  97.6 F (36.4 C)  98 F (36.7 C)  TempSrc:  Temporal  Oral  SpO2: 98%   96%  Weight:  53 kg    Height:  5' 4 (1.626 m)      Final diagnoses:  Alcohol intoxication with delirium [facility])        Final diagnoses:  Alcohol intoxication with delirium [facility])    ED Discharge Orders      None          Emil Share, DO 06/17/24 2319

## 2024-06-18 ENCOUNTER — Emergency Department (HOSPITAL_COMMUNITY)

## 2024-06-18 DIAGNOSIS — M51369 Other intervertebral disc degeneration, lumbar region without mention of lumbar back pain or lower extremity pain: Secondary | ICD-10-CM | POA: Diagnosis not present

## 2024-06-18 DIAGNOSIS — I700 Atherosclerosis of aorta: Secondary | ICD-10-CM | POA: Diagnosis not present

## 2024-06-18 DIAGNOSIS — M533 Sacrococcygeal disorders, not elsewhere classified: Secondary | ICD-10-CM | POA: Diagnosis not present

## 2024-06-18 NOTE — ED Notes (Signed)
Pt ambulated in hallway w/o difficulty 

## 2024-06-18 NOTE — Discharge Instructions (Signed)
 You were seen today after an episode of delirium.  This is likely really related to alcohol use.

## 2024-06-18 NOTE — ED Provider Notes (Signed)
 This is a 78 year old female who presented initially combative and altered.  Positive EtOH.  Required chemical sedation.  Blood alcohol level 215.  Patient rested comfortably overnight.  She slowly woke up and appears at her baseline.  She does not have any recollection of the events from last night.  States she only drank 1 glass of wine with dinner which is her normal.  She is awake, alert, ambulatory.  Tolerating p.o.  Complaining of some tailbone pain.  She did fall.  X-rays are negative for acute fracture.  Requesting discharge.  Husband has been called for pickup.  Physical Exam  BP (!) 145/82   Pulse 83   Temp 97.8 F (36.6 C) (Temporal)   Resp 19   Ht 1.626 m (5' 4)   Wt 53 kg   SpO2 95%   BMI 20.06 kg/m   Physical Exam Awake, alert, no acute distress Procedures  Procedures  ED Course / MDM   Clinical Course as of 06/18/24 0631  Sat Jun 18, 2024  0108 Patient still quite somnolent.  Resting comfortably. [CH]  [id number] Patient is now awake and ambulatory.  Tolerating p.o.  Complaining of tailbone pain.  She did fall.  Will get x-ray. [CH]  [id number] Patient is awake and alert.  Requesting discharge home.  Awaiting her husband. [CH]    Clinical Course User Index [CH] Lanetta Figuero, Charmaine FALCON, MD   Medical Decision Making Amount and/or Complexity of Data Reviewed Labs: ordered. Radiology: ordered.  Risk Prescription drug management.           Bari Charmaine FALCON, MD 06/18/24 [phone]

## 2024-06-18 NOTE — ED Notes (Signed)
 Pt ambulatory to restroom, tolerated well. Attempted to call husband again, no answer. Called [facility], the senior living facility where pt lives per pt request. Spoke with staff at facility and they will have Zachary call and arrange a ride for patient. Pt awaiting ride for discharge.

## 2024-06-18 NOTE — ED Notes (Signed)
 Pt alert and oriented, resting comfortably, waiting for husband to pick her up. Pt has no active complaints and was changed into paper scrubs. Soiled clothing in belongings bag, given to pt.

## 2024-06-18 NOTE — ED Notes (Signed)
 Attempted to call pt's husband, no answer, left VM.

## 2024-06-27 ENCOUNTER — Ambulatory Visit (INDEPENDENT_AMBULATORY_CARE_PROVIDER_SITE_OTHER): Payer: Medicare HMO

## 2024-06-27 VITALS — BP 155/76 | HR 72 | Temp 98.3°F | Resp 18 | Ht 64.0 in | Wt 117.4 lb

## 2024-06-27 DIAGNOSIS — E7800 Pure hypercholesterolemia, unspecified: Secondary | ICD-10-CM | POA: Diagnosis not present

## 2024-06-27 MED ORDER — INCLISIRAN SODIUM 284 MG/1.5ML ~~LOC~~ SOSY
284.0000 mg | PREFILLED_SYRINGE | Freq: Once | SUBCUTANEOUS | Status: AC
Start: 2024-06-27 — End: 2024-06-27
  Administered 2024-06-27: 284 mg via SUBCUTANEOUS
  Filled 2024-06-27: qty 1.5

## 2024-06-27 NOTE — Progress Notes (Signed)
 Diagnosis: Hypercholestremia  Provider:  Mannam, Praveen MD  Procedure: Injection  Leqvio  (inclisiran), Dose: 284 mg, Site: subcutaneous, Number of injections: 2  Injection Site(s): Left arm  Post Care: Patient declined observation  Discharge: Condition: Good, Destination: Home . AVS Declined  Performed by:  Eleanor DELENA Bloch, RN

## 2024-07-05 ENCOUNTER — Ambulatory Visit (INDEPENDENT_AMBULATORY_CARE_PROVIDER_SITE_OTHER): Admitting: Internal Medicine

## 2024-07-05 ENCOUNTER — Encounter: Payer: Self-pay | Admitting: Internal Medicine

## 2024-07-05 VITALS — BP 114/68 | HR 86 | Temp 97.5°F | Ht 64.0 in | Wt 116.0 lb

## 2024-07-05 DIAGNOSIS — E778 Other disorders of glycoprotein metabolism: Secondary | ICD-10-CM | POA: Insufficient documentation

## 2024-07-05 DIAGNOSIS — G319 Degenerative disease of nervous system, unspecified: Secondary | ICD-10-CM | POA: Diagnosis not present

## 2024-07-05 DIAGNOSIS — R7303 Prediabetes: Secondary | ICD-10-CM | POA: Diagnosis not present

## 2024-07-05 LAB — HEMOGLOBIN A1C: Hgb A1c MFr Bld: 6.1 % (ref 4.6–6.5)

## 2024-07-05 LAB — COMPREHENSIVE METABOLIC PANEL WITH GFR
ALT: 22 U/L (ref 0–35)
AST: 25 U/L (ref 0–37)
Albumin: 4.5 g/dL (ref 3.5–5.2)
Alkaline Phosphatase: 88 U/L (ref 39–117)
BUN: 21 mg/dL (ref 6–23)
CO2: 30 meq/L (ref 19–32)
Calcium: 9.9 mg/dL (ref 8.4–10.5)
Chloride: 102 meq/L (ref 96–112)
Creatinine, Ser: 0.99 mg/dL (ref 0.40–1.20)
GFR: 54.1 mL/min — ABNORMAL LOW (ref >60.00)
Glucose, Bld: 103 mg/dL — ABNORMAL HIGH (ref 70–99)
Potassium: 5 meq/L (ref 3.5–5.1)
Sodium: 139 meq/L (ref 135–145)
Total Bilirubin: 0.4 mg/dL (ref 0.2–1.2)
Total Protein: 7.5 g/dL (ref 6.0–8.3)

## 2024-07-05 NOTE — Assessment & Plan Note (Signed)
 Suspect spurious at the ER. Checking CMP to verify if low again will need assessment.

## 2024-07-05 NOTE — Assessment & Plan Note (Signed)
 With low normal albumin. Checking CMP to re-assess.

## 2024-07-05 NOTE — Progress Notes (Signed)
 Subjective:   Patient ID: Wendy Patrick, female    DOB: 1946-07-26, 78 y.o.   MRN: [id number]  Discussed the use of AI scribe software for clinical note transcription with the patient, who gave verbal consent to proceed.  History of Present Illness Wendy Patrick is a 78 year old female with a history of mini-stroke who presents with confusion and memory issues following excessive alcohol consumption.  She experienced confusion and memory loss after consuming alcohol, recalling only drinking a glass of wine with dinner and a half glass after. She fell and injured her tailbone, resulting in a hospital visit. She does not remember the events leading to her hospital admission and woke up confused in the hospital.  During her hospital stay, various tests were conducted. A urine test was performed, as well as a drug screen and blood counts. Kidney and liver panels were checked, and the glucose, calcium, and protein levels were noted to be slightly outside the normal range. She remained awake throughout the night during her hospital stay.  She reports ongoing memory issues, sometimes forgetting the day or steps in a recipe. She has been taking over-the-counter memory supplements, but is unsure of their effectiveness. She experiences occasional 'black spots' in her vision, reminiscent of past migraine symptoms, but without headache.  No new chest pain or breathing problems. Reports alternating constipation and diarrhea. Memory issues have worsened slightly since the last visit.  Review of Systems  Constitutional: Negative.   HENT: Negative.    Eyes: Negative.   Respiratory:  Negative for cough, chest tightness and shortness of breath.   Cardiovascular:  Negative for chest pain, palpitations and leg swelling.  Gastrointestinal:  Negative for abdominal distention, abdominal pain, constipation, diarrhea, nausea and vomiting.  Musculoskeletal: Negative.   Skin: Negative.   Neurological: Negative.    Psychiatric/Behavioral:  Positive for confusion and decreased concentration.     Objective:  Physical Exam Constitutional:      Appearance: She is well-developed.  HENT:     Head: Normocephalic and atraumatic.  Cardiovascular:     Rate and Rhythm: Normal rate and regular rhythm.  Pulmonary:     Effort: Pulmonary effort is normal. No respiratory distress.     Breath sounds: Normal breath sounds. No wheezing or rales.  Abdominal:     General: Bowel sounds are normal. There is no distension.     Palpations: Abdomen is soft.     Tenderness: There is no abdominal tenderness. There is no rebound.  Musculoskeletal:     Cervical back: Normal range of motion.  Skin:    General: Skin is warm and dry.  Neurological:     Mental Status: She is alert and oriented to person, place, and time.     Coordination: Coordination normal.     Vitals:   07/05/24 1025  BP: 114/68  Pulse: 86  Temp: (!) 97.5 F (36.4 C)  TempSrc: Oral  SpO2: 96%  Weight: 116 lb (52.6 kg)  Height: 5' 4 (1.626 m)    Assessment and Plan Assessment & Plan Fall with injury (tailbone contusion)   She sustained a fall resulting in a tailbone contusion due to excessive alcohol intake. Tailbone soreness is present but healing.  Alcohol use (recent excessive intake, now abstinent)   Recent excessive alcohol intake led to confusion and a fall. She has decided to abstain from alcohol permanently. Discussed alcohol's increased effects with age and lack of health benefits. Encouraged lifelong abstinence to prevent future incidents.  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 Brain imaging shows advanced for age-related atrophy. Memory issues include difficulty with daily tasks and recipes. Efficacy of current memory supplements is uncertain. She was prescribed aricept  which she is no longer taking and does not want to resume  Abnormal  laboratory findings (mild hypocalcemia, mild hypoproteinemia)   Mild hypocalcemia and hypoproteinemia are likely due to recent excessive alcohol intake and are not indicative of a chronic problem. Calcium is expected to normalize. Recheck blood work to assess calcium and protein levels.

## 2024-07-05 NOTE — Patient Instructions (Signed)
We will recheck the labs.

## 2024-07-05 NOTE — Assessment & Plan Note (Signed)
 Counseled about this again. Some progression of memory changes since last year. She is no longer taking aricept  but is taking a supplement otc.

## 2024-07-05 NOTE — Assessment & Plan Note (Signed)
Checking HgA1c as due.

## 2024-07-06 ENCOUNTER — Ambulatory Visit: Payer: Self-pay | Admitting: Internal Medicine

## 2024-07-27 ENCOUNTER — Ambulatory Visit (INDEPENDENT_AMBULATORY_CARE_PROVIDER_SITE_OTHER)

## 2024-07-27 VITALS — BP 112/68 | HR 75 | Ht 64.0 in | Wt 118.2 lb

## 2024-07-27 DIAGNOSIS — K635 Polyp of colon: Secondary | ICD-10-CM | POA: Diagnosis not present

## 2024-07-27 DIAGNOSIS — Z0000 Encounter for general adult medical examination without abnormal findings: Secondary | ICD-10-CM | POA: Diagnosis not present

## 2024-07-27 NOTE — Progress Notes (Addendum)
 Subjective:  Please attest and cosign this visit due to patients primary care provider not being in the office at the time the visit was completed.  (Pt of Dr Almarie Cleveland)   Wendy Patrick is a 78 y.o. who presents for a Medicare Wellness preventive visit.  As a reminder, Annual Wellness Visits don't include a physical exam, and some assessments may be limited, especially if this visit is performed virtually. We may recommend an in-person follow-up visit with your provider if needed.  Visit Complete: In person  Persons Participating in Visit: Patient.  AWV Questionnaire: No: Patient Medicare AWV questionnaire was not completed prior to this visit.  Cardiac Risk Factors include: advanced age (>77men, >11 women);Other (see comment), Risk factor comments: Former Smoker     Objective:    Today's Vitals   07/27/24 1427  BP: 112/68  Pulse: 75  Weight: 118 lb 3.2 oz (53.6 kg)  Height: 5' 4 (1.626 m)   Body mass index is 20.29 kg/m.     07/27/2024    2:45 PM 06/17/2024    7:28 PM 12/23/2023   10:08 AM 02/18/2023   11:56 AM 06/23/2022    9:55 AM 02/14/2021    8:15 AM 04/17/2020    7:24 AM  Advanced Directives  Does Patient Have a Medical Advance Directive? Yes No No Yes No Yes Yes  Type of Estate agent of [place];Living will   Healthcare Power of [place];Living will  Living will;Healthcare Power of Attorney Living will  Does patient want to make changes to medical advance directive?      No - Patient declined No - Patient declined  Copy of Healthcare Power of Attorney in Chart? No - copy requested     No - copy requested   Would patient like information on creating a medical advance directive?  No - Patient declined   No - Patient declined      Current Medications (verified) Outpatient Encounter Medications as of 07/27/2024  Medication Sig   azelastine  (ASTELIN ) 0.1 % nasal spray Place 1 spray into both nostrils 2 (two) times daily. Use in each nostril  as directed   fluticasone  (FLONASE ) 50 MCG/ACT nasal spray Place 2 sprays into both nostrils daily.   KRILL OIL PO Take 250 mg by mouth daily.   Multiple Vitamins-Minerals (CENTRUM SILVER PO) Take 1 tablet by mouth daily with breakfast.   umeclidinium-vilanterol (ANORO ELLIPTA ) 62.5-25 MCG/ACT AEPB Inhale 1 puff into the lungs daily.   XYZAL  ALLERGY  24HR 5 MG tablet Take 5 mg by mouth in the morning.   ezetimibe  (ZETIA ) 10 MG tablet Take 1 tablet (10 mg total) by mouth daily. (Patient not taking: Reported on 07/27/2024)   No facility-administered encounter medications on file as of 07/27/2024.    Allergies (verified) Bevespi  aerosphere [glycopyrrolate-formoterol], Zetia  [ezetimibe ], Atorvastatin, Crestor [rosuvastatin], Duloxetine , Lamotrigine , Latex, and Statins   History: Past Medical History:  Diagnosis Date   AIN grade I    COPD (chronic obstructive pulmonary disease) (HCC)    Coronary artery disease 08/2009   40% mid left circ by cath in [place], [place]   Dyspnea    with heavy exertion   Endometriosis    1986   Endometriosis yrs ago   Family history of adverse reaction to anesthesia    sister ponv   GERD (gastroesophageal reflux disease)    Granuloma annulare    Gum lesion    per pt left jawborn has small knot on gum on back tooth, is  not painful or draining   Headache    History of gestational diabetes many  yrs ago   History of hiatal hernia    [facility] h[facility]   History of kidney stones 2011-08-15   passed on own   History of migraine yrs ago   Hyperlipidemia    Optic neuritis    2014/08/14   Optic neuritis Novembr   Trigeminal neuralgia    Urinary urgency    Vaginal anomaly    tears and bleeds easily   Past Surgical History:  Procedure Laterality Date   ABDOMINAL HYSTERECTOMY  age 69   CARDIAC CATHETERIZATION  2010   COLONOSCOPY  12/2019   3 polyps removed 1 pre cancer   EXCISION OF SKIN TAG N/A 02/14/2021   Procedure: EXCISION OF ANAL LESIONS;  Surgeon: Debby Hila, MD;   Location: [facility];  Service: General;  Laterality: N/A;  45 MIN TOTAL   EYE SURGERY Bilateral 14-Aug-2016   both eyes cataracts lens replacement   LEFT HEART CATH AND CORONARY ANGIOGRAPHY N/A 04/17/2020   Procedure: LEFT HEART CATH AND CORONARY ANGIOGRAPHY;  Surgeon: Wonda Sharper, MD;  Location[facility] INVASIVE CV LAB;  Service: Cardiovascular;  Laterality: N/A;   MANDIBLE SURGERY     gum tumor removed in [place] not sure if was benign per pt   MINOR HEMORRHOIDECTOMY  yrs ago   RECTAL EXAM UNDER ANESTHESIA N/A 02/14/2021   Procedure: ANAL EXAM UNDER ANESTHESIA;  Surgeon: Debby Hila, MD;  Location: [facility];  Service: General;  Laterality: N/A;   TUBAL LIGATION  yrs ago   UPPER GASTROINTESTINAL ENDOSCOPY  12/2019   Family History  Problem Relation Age of Onset   Cancer Mother        Breast    Breast cancer Mother    Stroke Brother        open heart surgery    Colon cancer Neg Hx    Esophageal cancer Neg Hx    Rectal cancer Neg Hx    Stomach cancer Neg Hx    Social History   Socioeconomic History   Marital status: Married    Spouse name: Zachary   Number of children: 1   Years of education: GED   Highest education level: Not on file  Occupational History   Occupation: Retired  Tobacco Use   Smoking status: Former    Current packs/day: 0.00    Average packs/day: 2.0 packs/day for 40.0 years (80.0 ttl pk-yrs)    Types: Cigarettes    Start date: 02/05/1956    Quit date: 02/05/1996    Years since quitting: 28.4   Smokeless tobacco: Never   Tobacco comments:    Educated LDCT for lung Ca to discuss with MD if interested  Vaping Use   Vaping status: Never Used  Substance and Sexual Activity   Alcohol use: Not Currently   Drug use: No   Sexual activity: Not Currently    Partners: Male    Birth control/protection: None  Other Topics Concern   Not on file  Social History Narrative   Lives w/ husband    Caffeine use: Coffee daily   Right  handed    Social Drivers of Health   Financial Resource Strain: Low Risk  (07/27/2024)   Overall Financial Resource Strain (CARDIA)    Difficulty of Paying Living Expenses: Not hard at all  Food Insecurity: No Food Insecurity (07/27/2024)   Hunger Vital Sign    Worried About Running Out of Food  in the Last Year: Never true    Ran Out of Food in the Last Year: Never true  Transportation Needs: No Transportation Needs (07/27/2024)   PRAPARE - Administrator, Civil Service (Medical): No    Lack of Transportation (Non-Medical): No  Physical Activity: Insufficiently Active (07/27/2024)   Exercise Vital Sign    Days of Exercise per Week: 5 days    Minutes of Exercise per Session: 10 min  Stress: No Stress Concern Present (07/27/2024)   [employer] of Occupational Health - Occupational Stress Questionnaire    Feeling of Stress: Only a little  Social Connections: Moderately Integrated (07/27/2024)   Social Connection and Isolation Panel    Frequency of Communication with Friends and Family: More than three times a week    Frequency of Social Gatherings with Friends and Family: More than three times a week    Attends Religious Services: Never    Database administrator or Organizations: Yes    Attends Banker Meetings: Never    Marital Status: Married    Tobacco Counseling Counseling given: No Tobacco comments: Educated LDCT for lung Ca to discuss with MD if interested    Clinical Intake:  Pre-visit preparation completed: Yes  Pain : No/denies pain     BMI - recorded: 20.29 Nutritional Status: BMI of 19-24  Normal Nutritional Risks: None Diabetes: No  Lab Results  Component Value Date   HGBA1C 6.1 07/05/2024   HGBA1C 6.0 05/25/2023   HGBA1C 6.2 02/28/2022     How often do you need to have someone help you when you read instructions, pamphlets, or other written materials from your doctor or pharmacy?: 1 - Never  Interpreter Needed?:  No  Information entered by :: Verdie Saba, CMA   Activities of Daily Living     07/27/2024    2:31 PM  In your present state of health, do you have any difficulty performing the following activities:  Hearing? 0  Comment wears hearing aids  Vision? 0  Difficulty concentrating or making decisions? 0  Walking or climbing stairs? 0  Dressing or bathing? 0  Doing errands, shopping? 0  Preparing Food and eating ? N  Using the Toilet? N  In the past six months, have you accidently leaked urine? Y  Comment wears a pantyliner  Do you have problems with loss of bowel control? N  Managing your Medications? N  Managing your Finances? N  Housekeeping or managing your Housekeeping? N    Patient Care Team: Rollene Almarie LABOR, MD as PCP - General (Internal Medicine) Mona Vinie BROCKS, MD as PCP - Cardiology (Cardiology) Darlean Ozell NOVAK, MD as Consulting Physician (Pulmonary Disease) Octavia Bruckner, MD as Consulting Physician (Ophthalmology) Skeet Juliene SAUNDERS, DO as Consulting Physician (Neurology) Mable Lenis, MD (Inactive) as Consulting Physician (Otolaryngology) Mona Vinie BROCKS, MD as Consulting Physician (Cardiology) Rosalie Kitchens, MD as Consulting Physician (Gastroenterology) Kara Dorn NOVAK, MD as Consulting Physician (Pulmonary Disease)  I have updated your Care Teams any recent Medical Services you may have received from other providers in the past year.     Assessment:   This is a routine wellness examination for Wendy Patrick.  Hearing/Vision screen Hearing Screening - Comments:: Denies hearing difficulties   Vision Screening - Comments:: Wears rx glasses - up to date with routine eye exams with Dr Medford Octavia   Goals Addressed               This Visit's Progress  Patient Stated (pt-stated)        Patient stated she plans to continue to exercise w/Spouse       Depression Screen     07/27/2024    2:33 PM 07/27/2023    8:41 AM 06/19/2023   10:35 AM  01/26/2023    9:07 AM 09/17/2022    9:12 AM 06/23/2022   10:00 AM 02/28/2022    8:18 AM  PHQ 2/9 Scores  PHQ - 2 Score 0 0 0 0 0 0 0  PHQ- 9 Score 0 0 0    0    Fall Risk     07/27/2024    2:30 PM 07/05/2024   10:32 AM 05/03/2024    2:13 PM 07/27/2023    8:41 AM 06/19/2023   10:28 AM  Fall Risk   Falls in the past year? 1 0 0 0 0  Number falls in past yr: 0 0  0 0  Comment 1      Injury with Fall? 0 0  0 0  Risk for fall due to : Impaired balance/gait      Follow up Falls evaluation completed;Falls prevention discussed Falls evaluation completed  Falls evaluation completed Falls evaluation completed    MEDICARE RISK AT HOME:  Medicare Risk at Home Any stairs in or around the home?: Yes (outside) If so, are there any without handrails?: No Home free of loose throw rugs in walkways, pet beds, electrical cords, etc?: Yes Adequate lighting in your home to reduce risk of falls?: Yes Life alert?: No Use of a cane, walker or w/c?: No Grab bars in the bathroom?: No Shower chair or bench in shower?: Yes Elevated toilet seat or a handicapped toilet?: No  TIMED UP AND GO:  Was the test performed?  No  Cognitive Function: 6CIT completed    05/27/2018    8:53 AM 05/30/2015    9:44 AM  MMSE - Mini Mental State Exam  Not completed:  Unable to complete  Orientation to time 5   Orientation to Place 5   Registration 3   Attention/ Calculation 3   Recall 3   Language- name 2 objects 2   Language- repeat 1   Language- follow 3 step command 3   Language- read & follow direction 1   Write a sentence 1   Copy design 1   Total score 28         07/27/2024    2:35 PM 06/23/2022   10:08 AM 10/03/2019   11:09 AM  6CIT Screen  What Year? 0 points 0 points 0 points  What month? 0 points 0 points 0 points  What time? 0 points 0 points 0 points  Count back from 20 0 points 0 points 0 points  Months in reverse 0 points 0 points 0 points  Repeat phrase 6 points 0 points 4 points  Total  Score 6 points 0 points 4 points    Immunizations Immunization History  Administered Date(s) Administered   PNEUMOCOCCAL CONJUGATE-20 10/02/2022   Pneumococcal Conjugate-13 06/05/2016   Pneumococcal Polysaccharide-23 12/01/2009, 08/02/2019, 08/22/2019   Tdap 12/01/2010   Zoster, Live 12/02/2011    Screening Tests Health Maintenance  Topic Date Due   COVID-19 Vaccine (1) Never done   Zoster Vaccines- Shingrix (1 of 2) 08/31/1963   DTaP/Tdap/Td (2 - Td or Tdap) 12/01/2020   Colonoscopy  09/11/2023   INFLUENZA VACCINE  07/01/2024   Medicare Annual Wellness (AWV)  07/27/2025   Pneumococcal Vaccine: 50+ Years  Completed   DEXA SCAN  Completed   Hepatitis C Screening  Completed   HPV VACCINES  Aged Out   Meningococcal B Vaccine  Aged Out    Health Maintenance  Health Maintenance Due  Topic Date Due   COVID-19 Vaccine (1) Never done   Zoster Vaccines- Shingrix (1 of 2) 08/31/1963   DTaP/Tdap/Td (2 - Td or Tdap) 12/01/2020   Colonoscopy  09/11/2023   INFLUENZA VACCINE  07/01/2024   Health Maintenance Items Addressed:  Referral sent to GI for colonoscopy - h/o colon polyps.  Last procedure 2021.  Additional Screening:  Vision Screening: Recommended annual ophthalmology exams for early detection of glaucoma and other disorders of the eye. Would you like a referral to an eye doctor? No    Dental Screening: Recommended annual dental exams for proper oral hygiene  Community Resource Referral / Chronic Care Management: CRR required this visit?  No   CCM required this visit?  No   Plan:    I have personally reviewed and noted the following in the patient's chart:   Medical and social history Use of alcohol, tobacco or illicit drugs  Current medications and supplements including opioid prescriptions. Patient is not currently taking opioid prescriptions. Functional ability and status Nutritional status Physical activity Advanced directives List of other  physicians Hospitalizations, surgeries, and ER visits in previous 12 months Vitals Screenings to include cognitive, depression, and falls Referrals and appointments  In addition, I have reviewed and discussed with patient certain preventive protocols, quality metrics, and best practice recommendations. A written personalized care plan for preventive services as well as general preventive health recommendations were provided to patient.   Verdie CHRISTELLA Saba, CMA   07/27/2024   After Visit Summary: (In Person-Declined) Patient declined AVS at this time.  Notes: Patient will schedule a 1-yr Physical w/PCP prior to leaving the office today.

## 2024-07-27 NOTE — Patient Instructions (Addendum)
 Ms. Borromeo , Thank you for taking time out of your busy schedule to complete your Annual Wellness Visit with me. I enjoyed our conversation and look forward to speaking with you again next year. I, as well as your care team,  appreciate your ongoing commitment to your health goals. Please review the following plan we discussed and let me know if I can assist you in the future. Your Game plan/ To Do List    Referrals: If you haven't heard from the office you've been referred to, please reach out to them at the phone provided.   Follow up Visits: We will see or speak with you next year for your Next Medicare AWV with our clinical staff Have you seen your provider in the last 6 months (3 months if uncontrolled diabetes)? Yes  Clinician Recommendations:  Aim for 30 minutes of exercise or brisk walking, 6-8 glasses of water, and 5 servings of fruits and vegetables each day.       This is a list of the screenings recommended for you:  Health Maintenance  Topic Date Due   COVID-19 Vaccine (1) Never done   Zoster (Shingles) Vaccine (1 of 2) 08/31/1963   DTaP/Tdap/Td vaccine (2 - Td or Tdap) 12/01/2020   Colon Cancer Screening  09/11/2023   Flu Shot  07/01/2024   Medicare Annual Wellness Visit  07/27/2025   Pneumococcal Vaccine for age over 49  Completed   DEXA scan (bone density measurement)  Completed   Hepatitis C Screening  Completed   HPV Vaccine  Aged Out   Meningitis B Vaccine  Aged Out    Advanced directives: (Copy Requested) Please bring a copy of your health care power of attorney and living will to the office to be added to your chart at your convenience. You can mail to [facility] [address] [place], [place] [address] or email to [email or web address] Advance Care Planning is important because it:  [x]  Makes sure you receive the medical care that is consistent with your values, goals, and preferences  [x]  It provides guidance to your family and loved ones  and reduces their decisional burden about whether or not they are making the right decisions based on your wishes.  Follow the link provided in your after visit summary or read over the paperwork we have mailed to you to help you started getting your Advance Directives in place. If you need assistance in completing these, please reach out to us  so that we can help you!

## 2024-08-08 ENCOUNTER — Ambulatory Visit (INDEPENDENT_AMBULATORY_CARE_PROVIDER_SITE_OTHER): Admitting: Internal Medicine

## 2024-08-08 ENCOUNTER — Encounter: Payer: Self-pay | Admitting: Internal Medicine

## 2024-08-08 VITALS — BP 122/60 | HR 75 | Temp 97.7°F | Ht 64.0 in | Wt 121.0 lb

## 2024-08-08 DIAGNOSIS — J410 Simple chronic bronchitis: Secondary | ICD-10-CM

## 2024-08-08 DIAGNOSIS — M5441 Lumbago with sciatica, right side: Secondary | ICD-10-CM | POA: Diagnosis not present

## 2024-08-08 DIAGNOSIS — T466X5A Adverse effect of antihyperlipidemic and antiarteriosclerotic drugs, initial encounter: Secondary | ICD-10-CM

## 2024-08-08 DIAGNOSIS — Z0000 Encounter for general adult medical examination without abnormal findings: Secondary | ICD-10-CM

## 2024-08-08 DIAGNOSIS — G319 Degenerative disease of nervous system, unspecified: Secondary | ICD-10-CM

## 2024-08-08 DIAGNOSIS — E7800 Pure hypercholesterolemia, unspecified: Secondary | ICD-10-CM

## 2024-08-08 DIAGNOSIS — M791 Myalgia, unspecified site: Secondary | ICD-10-CM

## 2024-08-08 DIAGNOSIS — R7303 Prediabetes: Secondary | ICD-10-CM

## 2024-08-08 DIAGNOSIS — G8929 Other chronic pain: Secondary | ICD-10-CM

## 2024-08-08 NOTE — Assessment & Plan Note (Signed)
 She was not able to tolerate aricept  and does not wish to try namenda. Is unable to tolerate statin.

## 2024-08-08 NOTE — Assessment & Plan Note (Signed)
 Unable to tolerate statins and has tried several over the years.

## 2024-08-08 NOTE — Progress Notes (Signed)
   Subjective:   Patient ID: Wendy Patrick, female    DOB: Aug 04, 1946, 78 y.o.   MRN: [id number]  The patient is here for physical. Pertinent topics discussed: Recurrent atypical chest pain stable from prior may be influenced by stress.  PMH, [facility], social history reviewed and updated  Review of Systems  Constitutional: Negative.   HENT: Negative.    Eyes: Negative.   Respiratory:  Negative for cough, chest tightness and shortness of breath.   Cardiovascular:  Positive for chest pain. Negative for palpitations and leg swelling.  Gastrointestinal:  Negative for abdominal distention, abdominal pain, constipation, diarrhea, nausea and vomiting.  Musculoskeletal:  Positive for arthralgias.  Skin: Negative.   Neurological: Negative.   Psychiatric/Behavioral: Negative.      Objective:  Physical Exam Constitutional:      Appearance: She is well-developed.  HENT:     Head: Normocephalic and atraumatic.  Cardiovascular:     Rate and Rhythm: Normal rate and regular rhythm.  Pulmonary:     Effort: Pulmonary effort is normal. No respiratory distress.     Breath sounds: Normal breath sounds. No wheezing or rales.  Abdominal:     General: Bowel sounds are normal. There is no distension.     Palpations: Abdomen is soft.     Tenderness: There is no abdominal tenderness.  Musculoskeletal:        General: Tenderness present.     Cervical back: Normal range of motion.  Skin:    General: Skin is warm and dry.  Neurological:     Mental Status: She is alert and oriented to person, place, and time.     Coordination: Coordination normal.     Vitals:   08/08/24 0839  BP: 122/60  Pulse: 75  Temp: 97.7 F (36.5 C)  TempSrc: Oral  SpO2: 98%  Weight: 121 lb (54.9 kg)  Height: 5' 4 (1.626 m)    Assessment & Plan:

## 2024-08-08 NOTE — Assessment & Plan Note (Signed)
 Flu shot yearly. Pneumonia complete. Shingrix due at pharmacy. Tetanus due at pharmacy. Colonoscopy aged out future but overdue. Mammogram aged out, pap smear aged out and dexa complete. Counseled about sun safety and mole surveillance. Counseled about the dangers of distracted driving. Given 10 year screening recommendations.

## 2024-08-08 NOTE — Assessment & Plan Note (Signed)
 Reviewed labs from August.

## 2024-08-08 NOTE — Assessment & Plan Note (Signed)
 Reviewed labs from earlier this year and she is not able to tolerate medication other than zetia  for this and is close to goal on that.

## 2024-08-08 NOTE — Assessment & Plan Note (Signed)
 Stable to mild worsening.

## 2024-08-08 NOTE — Assessment & Plan Note (Signed)
 She has some increase in symptoms and is still taking anoro daily. She did not get relief from rescue inhaler. She is non-smoker and encouraged lifelong cessation. Consider adjustment in regimen if exercise does not help with SOB.

## 2024-09-26 ENCOUNTER — Telehealth: Payer: Self-pay | Admitting: Internal Medicine

## 2024-09-26 NOTE — Telephone Encounter (Signed)
 Paper Work Dropped Off: [employer]   Date:09/26/24  Location of paper:  Dr Mona Box

## 2024-09-27 NOTE — Telephone Encounter (Signed)
 Faxed signed paperwork to infusion center

## 2024-10-20 ENCOUNTER — Ambulatory Visit: Attending: Internal Medicine | Admitting: Internal Medicine

## 2024-10-20 ENCOUNTER — Encounter: Payer: Self-pay | Admitting: Internal Medicine

## 2024-10-20 VITALS — BP 124/72 | HR 88 | Ht 64.0 in | Wt 128.2 lb

## 2024-10-20 DIAGNOSIS — R0609 Other forms of dyspnea: Secondary | ICD-10-CM

## 2024-10-20 DIAGNOSIS — E785 Hyperlipidemia, unspecified: Secondary | ICD-10-CM | POA: Diagnosis not present

## 2024-10-20 DIAGNOSIS — I25119 Atherosclerotic heart disease of native coronary artery with unspecified angina pectoris: Secondary | ICD-10-CM | POA: Diagnosis not present

## 2024-10-20 DIAGNOSIS — R030 Elevated blood-pressure reading, without diagnosis of hypertension: Secondary | ICD-10-CM

## 2024-10-20 DIAGNOSIS — I2510 Atherosclerotic heart disease of native coronary artery without angina pectoris: Secondary | ICD-10-CM | POA: Diagnosis not present

## 2024-10-20 NOTE — Patient Instructions (Signed)
 Medication Instructions:  NO CHANGES  *If you need a refill on your cardiac medications before your next appointment, please call your pharmacy*  Lab Work: FASTING NMR lipoprofile   If you have labs (blood work) drawn today and your tests are completely normal, you will receive your results only by: MyChart Message (if you have MyChart) OR A paper copy in the mail If you have any lab test that is abnormal or we need to change your treatment, we will call you to review the results.  T Follow-Up: At [facility], you and your health needs are our priority.  As part of our continuing mission to provide you with exceptional heart care, our providers are all part of one team.  This team includes your primary Cardiologist (physician) and Advanced Practice Providers or APPs (Physician Assistants and Nurse Practitioners) who all work together to provide you with the care you need, when you need it.  Your next appointment:    6 months with Dr. Mona  We recommend signing up for the patient portal called MyChart.  Sign up information is provided on this After Visit Summary.  MyChart is used to connect with patients for Virtual Visits (Telemedicine).  Patients are able to view lab/test results, encounter notes, upcoming appointments, etc.  Non-urgent messages can be sent to your provider as well.   To learn more about what you can do with MyChart, go to [email or web address].   Other Instructions

## 2024-10-20 NOTE — Addendum Note (Signed)
 Addended by: LORING ANDRIETTE HERO on: 10/20/2024 09:14 AM   Modules accepted: Orders

## 2024-10-20 NOTE — Progress Notes (Signed)
 OFFICE NOTE  Chief Complaint:  No complaints  Primary Care Physician: Rollene Almarie LABOR, MD  HPI:  Wendy Patrick is a 78 y.o. female with a past medial history significant for mild coronary artery disease with a 40% circumflex stenosis in 2010, COPD, type 2 diabetes, hyperlipidemia and statin intolerance.  She was previously followed by Dr. Shlomo however requested to switch to my care since her husband is a patient of mine.  We will try to see them simultaneously.  Recently she has been having some issues with diarrhea.  She is been undergoing treatment with antibiotics and care by Dr. Rosalie.  She feels drained.  She is also congested and having difficulty with allergies and chronic cough.  She reports labile blood pressures from the upper 90s to 140s, but has no history of hypertension and is not on medication.  In fact she takes only aspirin .  She has been previously seen by one of our PharmD's in the lipid clinic to discuss her dyslipidemia and was offered ezetimibe , but did not take the medication due to cost issues.  Currently she reports some discomfort in the left shoulder arm and left back.  This is similar but much less significant to the discomfort she had in 2010.  She says she gets it occasionally.  Is not necessarily worse with exertion or relieved by rest and does not sound particularly anginal to me.  She did have a nuclear stress test last year which was completely normal.  11/10/2018  Wendy Patrick returns today for routine follow-up.  She does have a history of coronary artery disease with some circumflex stenosis of 40% in 2010, type 2 diabetes, hyperlipidemia and statin intolerance with a goal LDL less than 70.  Her main issues have been with some degree of neuropathy for which she is on gabapentin .  She says she is not tolerating this well and feels tired and is having some mood changes related to that.  Unfortunately she has been statin intolerant.  She has been on both  atorvastatin and rosuvastatin which caused leg cramps.  We had discussed PCSK9 inhibitor therapy in the past however had not pursued it.  Her most recent lab work from June 2018 showed total cholesterol 270, HDL 60, LDL 178 and triglycerides 841.  Although her diet sound somewhat atherogenic, there may be a component of FH.   07/26/2019  Wendy Patrick is seen today in follow-up.  She is also been using Repatha .  Overall she seems to be tolerating however she has been having some concerns with runny nose and persistent cough.  This is atypical for the medication and she was concerned though it may be a side effect.  She needs a recheck of her lipids.  She was on gabapentin  for neuropathy but this is been discontinued and has been seeing neurologist.  There was concern that she may have some nerve damage from an injection in her teeth that may be related to her cough and some possible difficulty swallowing.  02/22/2020  Wendy Patrick is seen today in follow-up with her husband. Unfortunately she stopped using Repatha . She had had a good response in her LDL cholesterol which went from 171 down to 119. I suspect that she could have FH as there is a strong family history of heart disease in her parents. She has had no cardiac symptoms that I was aware of however recently did have some left-sided chest discomfort. It apparently was pretty persistent and lasted for  couple hours. It was not associated with exertion or relieved by rest however did radiate into her left arm. According to her husband she had had a cardiovascular work-up by Dr. Darron in the past which showed mild nonobstructive coronary disease with up to about a 40% stenosis. This was in 2010.  04/13/2020  Wendy Patrick is seen today in follow-up.  She underwent CT with FFR for ongoing chest pain on Apr 04, 2020.  I personally reviewed the study and demonstrated at least moderate mixed coronary disease in the circumflex with mild to moderate mixed coronary disease  of the LAD and RCA.  Coronary calcium score was 252, 76 percentile for age and sex matched control.  CT FFR was performed which demonstrated a significant flow-limiting mid to distal circumflex stenosis.  The FFR dropped suddenly from 0.97-0.62 suggesting a focal stenosis.  She had previously had a 40% stenosis in 2010 in this area and I suspect that has progressed.  Unfortunately she has been statin intolerant.  She also had been on Repatha  but stopped it due to side effects.  In addition the over read of the CT suggested emphysema as well as subcarinal adenopathy.  She had previously seen Dr. Darlean with pulmonary who performed some chest x-rays and had been treating her for cough which did not improve.  She would like to follow up with a different pulmonologist.  I will refer her.  08/16/2020  Wendy Patrick is seen today in follow-up.  Based on her abnormal CT FFR she underwent definitive left heart catheterization in May 2021.  This demonstrated nonobstructive coronary disease with mild nonobstructive disease of the proximal to mid LAD and mid left circumflex.  LVEDP was normal.  Maximal medical therapy was recommended.  Unfortunate she cannot tolerate Repatha  or statins.  LDL remained above target, last assessed in August 2020 at 119.  She is following a Mediterranean diet.  She will need repeat lipids.  She denies any chest pain.  She is working with our pulmonary and was noted to have some subcarinal lymphadenopathy.  She is scheduled to have pulmonary function testing tomorrow and a follow-up with Dr. Gretta.  08/13/2021  Wendy Patrick returns today for follow-up.  She is accompanied by her husband.  She says she is denying any chest pain or shortness of breath.  She does get some left sided pain over the lower ribs which can occur at rest typically sitting in the chair.  Cardiac catheterization last year showed nonobstructive coronary disease however she does have coronary calcification and a very high LDL  cholesterol over 190 as assessed in September 2021.  She is due for repeat lipids.  Pressure is well controlled today.  06/18/2023  Wendy Patrick is seen today in follow-up with her husband.  She says she is going downhill referring to a number of medical issues including trouble with her vision and hearing as well as episodes of diarrhea and instability leading to near falls.  She has been on Repatha  but feels it may be worsening her diarrhea.  Her husband who also is on Repatha  does not necessarily think that is the case.  Nonetheless she has had a good response to her lipids including a recent lipid profile showing total cholesterol 143, triglycerides 149, HDL 63 and LDL 50.  She is denied any chest pain or shortness of breath.  She did have an evaluation for chest pain earlier this year including stress test which was negative for ischemia.  She also had  an echocardiogram in May 2024 which showed an EF 65 to 70%.  02/01/2024  Wendy Patrick is seen today in follow-up with her husband.  They both started on Leqvio .  She has now had 2 injections and her recent lipid profile shows that she is still above target.  While she was on Repatha , her LDL was 50 however now on Leqvio  her LDL is 104 with an LDL particle number of 1205.  Is not clear why her response to the Repatha  was better although dietary factors may explain the difference in cholesterol.  10/20/2024  Wendy Patrick is seen today in follow-up.  She had an episode where she ended up in the ER after a fall which was worsened by intoxication.  She has been struggling with a lot of stress in her home.  Other than that she gets infrequent sharp chest pain.  She does have a history of hiatal hernia.  Her sister recently had a heart attack and there is heart disease in the family.  She had stress testing 2 years ago which was negative for ischemia but does have known coronary disease.  Blood pressure is well-controlled today 124/72.  Her lipids were still above target back  in February but are due to be reassessed.  She has another Leqvio  injection in January.  EKG is sinus rhythm today.  PMHx:  Past Medical History:  Diagnosis Date   AIN grade I    COPD (chronic obstructive pulmonary disease) (HCC)    Coronary artery disease 08/2009   40% mid left circ by cath in [place], [place]   Dyspnea    with heavy exertion   Endometriosis    1986   Endometriosis yrs ago   Family history of adverse reaction to anesthesia    sister ponv   GERD (gastroesophageal reflux disease)    Granuloma annulare    Gum lesion    per pt left jawborn has small knot on gum on back tooth, is not painful or draining   Headache    History of gestational diabetes many  yrs ago   History of hiatal hernia    [facility]light h[facility]   History of kidney stones 20-Nov-2011   passed on own   History of migraine yrs ago   Hyperlipidemia    Optic neuritis    Nov 19, 2014   Optic neuritis Novembr   Trigeminal neuralgia    Urinary urgency    Vaginal anomaly    tears and bleeds easily    Past Surgical History:  Procedure Laterality Date   ABDOMINAL HYSTERECTOMY  age 46   CARDIAC CATHETERIZATION  2010   COLONOSCOPY  12/2019   3 polyps removed 1 pre cancer   EXCISION OF SKIN TAG N/A 02/14/2021   Procedure: EXCISION OF ANAL LESIONS;  Surgeon: Debby Hila, MD;  Location: [facility];  Service: General;  Laterality: N/A;  45 MIN TOTAL   EYE SURGERY Bilateral 2016/11/19   both eyes cataracts lens replacement   LEFT HEART CATH AND CORONARY ANGIOGRAPHY N/A 04/17/2020   Procedure: LEFT HEART CATH AND CORONARY ANGIOGRAPHY;  Surgeon: Wonda Sharper, MD;  [facility]: [facility] INVASIVE CV LAB;  Service: Cardiovascular;  Laterality: N/A;   MANDIBLE SURGERY     gum tumor removed in [place] not sure if was benign per pt   MINOR HEMORRHOIDECTOMY  yrs ago   RECTAL EXAM UNDER ANESTHESIA N/A 02/14/2021   Procedure: ANAL EXAM UNDER ANESTHESIA;  Surgeon: Debby Hila, MD;  Location: [facility];  Service:  General;  Laterality: N/A;   TUBAL LIGATION  yrs ago   UPPER GASTROINTESTINAL ENDOSCOPY  12/2019    FAMHx:  Family History  Problem Relation Age of Onset   Cancer Mother        Breast    Breast cancer Mother    Stroke Brother        open heart surgery    Colon cancer Neg Hx    Esophageal cancer Neg Hx    Rectal cancer Neg Hx    Stomach cancer Neg Hx     SOCHx:   reports that she quit smoking about 28 years ago. Her smoking use included cigarettes. She started smoking about 68 years ago. She has a 80 pack-year smoking history. She has never used smokeless tobacco. She reports that she does not currently use alcohol. She reports that she does not use drugs.  ALLERGIES:  Allergies  Allergen Reactions   Bevespi  Aerosphere [Glycopyrrolate-Formoterol] Swelling and Other (See Comments)    Patient felt like her throat was closing.   Zetia  [Ezetimibe ] Other (See Comments)    Dizziness, balance issues   Atorvastatin Other (See Comments)    Myalgias    Crestor [Rosuvastatin] Other (See Comments)    Leg cramps   Duloxetine  Diarrhea   Lamotrigine  Rash   Latex Rash   Statins Other (See Comments)    Leg cramps    ROS: Pertinent items noted in HPI and remainder of comprehensive ROS otherwise negative.  HOME MEDS: Current Outpatient Medications on File Prior to Visit  Medication Sig Dispense Refill   azelastine  (ASTELIN ) 0.1 % nasal spray Place 1 spray into both nostrils 2 (two) times daily. Use in each nostril as directed (Patient taking differently: Place 1 spray into both nostrils as needed. Use in each nostril as directed) 30 mL 1   ezetimibe  (ZETIA ) 10 MG tablet Take 1 tablet (10 mg total) by mouth daily. 90 tablet 3   fluticasone  (FLONASE ) 50 MCG/ACT nasal spray Place 2 sprays into both nostrils daily. (Patient taking differently: Place 2 sprays into both nostrils as needed.) 16 g 0   KRILL OIL PO Take 250 mg by mouth daily.     Multiple Vitamins-Minerals (CENTRUM SILVER PO)  Take 1 tablet by mouth daily with breakfast.     umeclidinium-vilanterol (ANORO ELLIPTA ) 62.5-25 MCG/ACT AEPB Inhale 1 puff into the lungs daily. 60 each 11   XYZAL  ALLERGY  24HR 5 MG tablet Take 5 mg by mouth in the morning. (Patient taking differently: Take 5 mg by mouth as needed.)     No current facility-administered medications on file prior to visit.    LABS/IMAGING: No results found for this or any previous visit (from the past 48 hours).  No results found.  LIPID PANEL:    Component Value Date/Time   CHOL 143 05/25/2023 0803   CHOL 178 04/29/2022 1012   TRIG 149.0 05/25/2023 0803   HDL 63.50 05/25/2023 0803   HDL 69 04/29/2022 1012   CHOLHDL 2 05/25/2023 0803   VLDL 29.8 05/25/2023 0803   LDLCALC 50 05/25/2023 0803   LDLCALC 85 04/29/2022 1012     WEIGHTS: Wt Readings from Last 3 Encounters:  10/20/24 128 lb 3.2 oz (58.2 kg)  08/08/24 121 lb (54.9 kg)  07/27/24 118 lb 3.2 oz (53.6 kg)    VITALS: BP 124/72   Pulse 88   Ht 5' 4 (1.626 m)   Wt 128 lb 3.2 oz (58.2 kg)   SpO2 95%  BMI 22.01 kg/m   EXAM: General appearance: alert and no distress Lungs: clear to auscultation bilaterally Heart: regular rate and rhythm, S1, S2 normal, no murmur, click, rub or gallop Extremities: extremities normal, atraumatic, no cyanosis or edema Neurologic: Grossly normal  EKG: EKG Interpretation Date/Time:  Thursday October 20 2024 08:27:08 EST Ventricular Rate:  88 PR Interval:  154 QRS Duration:  74 QT Interval:  374 QTC Calculation: 452 R Axis:   86  Text Interpretation: Normal sinus rhythm Normal ECG When compared with ECG of 17-Jun-2024 19:18, No significant change since last tracing Confirmed by Mona Kent [id number]) on 10/20/2024 8:42:19 AM    ASSESSMENT: History of chest pain-abnormal CT FFR suggesting flow-limiting stenosis of the mid to distal circumflex, however cardiac catheterization in 03/2020 demonstrated mild nonobstructive disease of the mid  circumflex and proximal LAD -negative Myoview  stress test (01/2022) Mild nonobstructive coronary disease by cath (2010) -40% mid circumflex lesion Dyslipidemia-statin intolerant Emphysema Subcarinal lymphadenopathy  PLAN: 1.   Wendy Patrick is doing fairly well with infrequent atypical chest pain although she does have underlying coronary disease.  She has been on Leqvio  with her husband although her most recent response was not at target.  She needs repeat lipids which we will order.  She is in sinus rhythm with good blood pressure control.  Will plan to continue her current medications.  Follow-up in 6 months or sooner as necessary  Kent KYM Mona, MD, [facility], FNLA, FACP  [place]  [facility]  Medical Director of the Advanced Lipid Disorders &  Cardiovascular Risk Reduction Clinic Diplomate of the American Board of Clinical Lipidology Attending Cardiologist  Direct Dial: [phone]  Fax: [phone]  Website:  [email or web address] Kent BROCKS Nazli Penn 10/20/2024, 8:42 AM

## 2024-10-22 LAB — NMR, LIPOPROFILE
Cholesterol, Total: 200 mg/dL — ABNORMAL HIGH (ref 100–199)
HDL Particle Number: 45.3 umol/L (ref >=30.5)
HDL-C: 63 mg/dL (ref >39)
LDL Particle Number: 1142 nmol/L — ABNORMAL HIGH (ref <1000)
LDL Size: 21.7 nm (ref >20.5)
LDL-C (NIH Calc): 106 mg/dL — ABNORMAL HIGH (ref 0–99)
LP-IR Score: 49 — ABNORMAL HIGH (ref <=45)
Small LDL Particle Number: 346 nmol/L (ref <=527)
Triglycerides: 181 mg/dL — ABNORMAL HIGH (ref 0–149)

## 2024-10-24 ENCOUNTER — Encounter: Payer: Self-pay | Admitting: Internal Medicine

## 2024-10-25 ENCOUNTER — Telehealth: Payer: Self-pay

## 2024-10-25 NOTE — Telephone Encounter (Signed)
 Spoke with pt's husband @1130am . Husband stated that Wife Audria) said that she didn't want to live. Husband also stated that wife is experiencing a lot of depression. I had the opportunity to speak with pt and she had some concerns of infidelity on the part of the husband both in office and on FB which husband denies. Husband and wife have been in an ongoing disagreement all day. At the time the call was placed, husband stated that things had calmed down a bit. Asked husband what he would like to be done and stated that he would like for wife to have something for depression.   Wife is continuing in the background insisting that it is the husband that needs help. Wife stated that they have gone to counseling and problem remained unresolved due to the connection she felt that took place between the counselor and husband. (She felt that the counselor was taking his side).  Again, I spoke with wife and asked closed ended questions such as, do you have feelings of harming yourself? The answer: NO Also asked wife, Are you open to speaking to someone or someone coming to your home? The answer: NO  Later, wife stated that she would be open to someone coming to the home. Informed husband that there is a [facility] facility on [address] that he may consider contacting for further assistance. Asked pt, would you like an appt to see Dr. Rollene? The answer: NO   Wife continues with narrative that husband has been unfaithful and watches explicit videos. Pt recalls their 10 year marriage but feels husband never wanted to marry her after the first 5 weeks. Pt does not want to go to the hospital due to concerns that the woman that the husband is involved with will come and stay in the home with the husband.   Advised husband that we had limited options due to the pt's refusal to seek help. Encouraged husband to call 911 if the situation escalates or if he feels that there is danger to himself or to his wife. I  will notify Dr. Rollene so that she can be aware of the situation and will f/u with husband if there are any further recommendations. Husband verbalized understanding.

## 2024-10-26 ENCOUNTER — Telehealth: Payer: Self-pay | Admitting: Internal Medicine

## 2024-10-26 MED ORDER — CITALOPRAM HYDROBROMIDE 10 MG PO TABS: 10.0000 mg | ORAL_TABLET | Freq: Every day | ORAL | 3 refills | Status: AC

## 2024-10-26 NOTE — Telephone Encounter (Signed)
 Review of note from encounter from 10/25/24 with patient and husband and we can prescribe a medicine for mood to see if this helps her. Rx done for celexa  10 mg daily and follow up 4 weeks to see if working.

## 2024-10-26 NOTE — Telephone Encounter (Signed)
 Note from Edsel Kerns from encounter with patient on 10/25/24: Spoke with pt's husband @1130am . Husband stated that Wife Donahoe) said that she didn't want to live. Husband also stated that wife is experiencing a lot of depression. I had the opportunity to speak with pt and she had some concerns of infidelity on the part of the husband both in office and on FB which husband denies. Husband and wife have been in an ongoing disagreement all day. At the time the call was placed, husband stated that things had calmed down a bit. Asked husband what he would like to be done and stated that he would like for wife to have something for depression.    Wife is continuing in the background insisting that it is the husband that needs help. Wife stated that they have gone to counseling and problem remained unresolved due to the connection she felt that took place between the counselor and husband. (She felt that the counselor was taking his side).   Again, I spoke with wife and asked closed ended questions such as, do you have feelings of harming yourself? The answer: NO Also asked wife, Are you open to speaking to someone or someone coming to your home? The answer: NO  Later, wife stated that she would be open to someone coming to the home. Informed husband that there is a [facility] facility on [address] that he may consider contacting for further assistance. Asked pt, would you like an appt to see Dr. Rollene? The answer: NO    Wife continues with narrative that husband has been unfaithful and watches explicit videos. Pt recalls their 4 year marriage but feels husband never wanted to marry her after the first 5 weeks. Pt does not want to go to the hospital due to concerns that the woman that the husband is involved with will come and stay in the home with the husband.    Advised husband that we had limited options due to the pt's refusal to seek help. Encouraged husband to call 911 if the situation  escalates or if he feels that there is danger to himself or to his wife. I will notify Dr. Rollene so that she can be aware of the situation and will f/u with husband if there are any further recommendations. Husband verbalized understanding.

## 2024-10-26 NOTE — Telephone Encounter (Signed)
Spoke with pts husband and is aware

## 2024-10-28 ENCOUNTER — Encounter: Payer: Self-pay | Admitting: Internal Medicine

## 2024-10-31 ENCOUNTER — Ambulatory Visit: Payer: Self-pay | Admitting: Internal Medicine

## 2024-11-09 ENCOUNTER — Ambulatory Visit (INDEPENDENT_AMBULATORY_CARE_PROVIDER_SITE_OTHER): Admitting: Internal Medicine

## 2024-11-09 VITALS — BP 130/70 | HR 79 | Temp 97.8°F | Ht 64.0 in

## 2024-11-09 DIAGNOSIS — F4329 Adjustment disorder with other symptoms: Secondary | ICD-10-CM | POA: Diagnosis not present

## 2024-11-09 DIAGNOSIS — J440 Chronic obstructive pulmonary disease with acute lower respiratory infection: Secondary | ICD-10-CM

## 2024-11-09 MED ORDER — PREDNISONE 20 MG PO TABS: 40.0000 mg | ORAL_TABLET | Freq: Every day | ORAL | 0 refills | Status: AC

## 2024-11-09 NOTE — Patient Instructions (Signed)
 We have sent in prednisone  to take 2 pills daily for 4 days to help.

## 2024-11-09 NOTE — Progress Notes (Signed)
° °  Subjective:   Patient ID: Wendy Patrick, female    DOB: Apr 30, 1946, 78 y.o.   MRN: [id number]  The patient is an 78 YO female coming in for a URI and anxiety. URI started about 9 days ago and improving mildly. She is having marital issues with her husband and some trust issues.She does not believe him and they have tried counseling but this did not help. She is not wanting to reconcile but does not feel she has options.    Review of Systems  Constitutional:  Positive for activity change, appetite change and chills. Negative for fatigue, fever and unexpected weight change.  HENT:  Positive for congestion, nosebleeds, postnasal drip, rhinorrhea and sinus pressure. Negative for ear discharge, ear pain, sinus pain, sneezing, sore throat, tinnitus, trouble swallowing and voice change.   Eyes: Negative.   Respiratory:  Positive for cough. Negative for chest tightness, shortness of breath and wheezing.   Cardiovascular: Negative.   Gastrointestinal: Negative.   Musculoskeletal:  Positive for myalgias.  Neurological: Negative.   Psychiatric/Behavioral:  Positive for decreased concentration and dysphoric mood. The patient is nervous/anxious.     Objective:  Physical Exam Constitutional:      Appearance: She is well-developed.  HENT:     Head: Normocephalic and atraumatic.     Comments: Oropharynx with redness and clear drainage, nose with swollen turbinates, TMs normal bilaterally.  Neck:     Thyroid : No thyromegaly.  Cardiovascular:     Rate and Rhythm: Normal rate and regular rhythm.  Pulmonary:     Effort: Pulmonary effort is normal. No respiratory distress.     Breath sounds: Normal breath sounds. No wheezing or rales.  Abdominal:     Palpations: Abdomen is soft.  Musculoskeletal:        General: Tenderness present.     Cervical back: Normal range of motion.  Lymphadenopathy:     Cervical: No cervical adenopathy.  Skin:    General: Skin is warm and dry.  Neurological:      Mental Status: She is alert and oriented to person, place, and time.     Vitals:   11/09/24 0929  BP: 130/70  Pulse: 79  Temp: 97.8 F (36.6 C)  TempSrc: Oral  SpO2: 97%  Height: 5' 4 (1.626 m)

## 2024-11-10 ENCOUNTER — Encounter: Payer: Self-pay | Admitting: Internal Medicine

## 2024-11-10 DIAGNOSIS — F4320 Adjustment disorder, unspecified: Secondary | ICD-10-CM | POA: Insufficient documentation

## 2024-11-10 NOTE — Assessment & Plan Note (Signed)
 She is struggling with marital dispute which seems irreconcilable. She has tried couples therapy. Advised she should consider individual counseling and potentially consulting a lawyer/financial advisor as she feels she does not have options and this will help her explore her options.

## 2024-11-10 NOTE — Assessment & Plan Note (Signed)
 With concurrent URI and early flare. Rx prednisone  4 day course to help avoid full flare. Continue anoro.

## 2024-11-14 DIAGNOSIS — H26491 Other secondary cataract, right eye: Secondary | ICD-10-CM | POA: Diagnosis not present

## 2024-11-14 DIAGNOSIS — H04123 Dry eye syndrome of bilateral lacrimal glands: Secondary | ICD-10-CM | POA: Diagnosis not present

## 2024-11-14 DIAGNOSIS — H0102B Squamous blepharitis left eye, upper and lower eyelids: Secondary | ICD-10-CM | POA: Diagnosis not present

## 2024-11-14 DIAGNOSIS — H10413 Chronic giant papillary conjunctivitis, bilateral: Secondary | ICD-10-CM | POA: Diagnosis not present

## 2024-11-14 DIAGNOSIS — H35372 Puckering of macula, left eye: Secondary | ICD-10-CM | POA: Diagnosis not present

## 2024-11-14 DIAGNOSIS — Z961 Presence of intraocular lens: Secondary | ICD-10-CM | POA: Diagnosis not present

## 2024-11-14 DIAGNOSIS — H40013 Open angle with borderline findings, low risk, bilateral: Secondary | ICD-10-CM | POA: Diagnosis not present

## 2024-11-14 DIAGNOSIS — H0102A Squamous blepharitis right eye, upper and lower eyelids: Secondary | ICD-10-CM | POA: Diagnosis not present

## 2024-11-14 DIAGNOSIS — H469 Unspecified optic neuritis: Secondary | ICD-10-CM | POA: Diagnosis not present

## 2024-11-28 ENCOUNTER — Telehealth: Payer: Self-pay | Admitting: Internal Medicine

## 2024-11-28 NOTE — Telephone Encounter (Signed)
 [employer] called to schedule medication shipments for inclisiran (LEQVIO ) 284 MG/1.5ML SOSY injection.

## 2024-11-28 NOTE — Telephone Encounter (Signed)
 Routed to [employer] [facility] with infusion center

## 2024-11-29 NOTE — Telephone Encounter (Signed)
 Thanks Jenna,  We will f/u.

## 2024-12-26 ENCOUNTER — Telehealth: Payer: Self-pay | Admitting: Pharmacy Technician

## 2024-12-26 NOTE — Telephone Encounter (Signed)
 Auth Submission: APPROVED PAP FREE DRUG Site of care: Site of care: [facility] Payer: [employer] MED[employer]ARE Medication & CPT/J Code(s) submitted: Leqvio  (Inclisiran) J1306 Diagnosis Code: E78.5 Route of submission (phone, fax, portal):  Phone # Fax # Auth type: PAP Units/visits requested: Q6MONTHS X2 Reference number:  Approval from: 12/01/24 to 11/30/25

## 2024-12-29 ENCOUNTER — Ambulatory Visit (INDEPENDENT_AMBULATORY_CARE_PROVIDER_SITE_OTHER)

## 2024-12-29 VITALS — BP 121/71 | HR 82 | Temp 98.4°F | Resp 20 | Ht 64.0 in | Wt 127.8 lb

## 2024-12-29 DIAGNOSIS — E7800 Pure hypercholesterolemia, unspecified: Secondary | ICD-10-CM

## 2024-12-29 MED ORDER — INCLISIRAN SODIUM 284 MG/1.5ML ~~LOC~~ SOSY
284.0000 mg | PREFILLED_SYRINGE | Freq: Once | SUBCUTANEOUS | Status: AC
  Administered 2024-12-29: 284 mg via SUBCUTANEOUS
  Filled 2024-12-29: qty 1.5

## 2024-12-29 NOTE — Progress Notes (Signed)
 Diagnosis: Hyperlipidemia  Provider:  Chilton Greathouse MD  Procedure: Injection  Leqvio (inclisiran), Dose: 284 mg, Site: subcutaneous, Number of injections: 1  Injection Site(s): Left arm  Post Care:  left arm injection  Discharge: Condition: Good, Destination: Home . AVS Declined  Performed by:  Rico Ala, LPN

## 2025-06-28 ENCOUNTER — Ambulatory Visit

## 2025-08-02 ENCOUNTER — Ambulatory Visit
